# Patient Record
Sex: Female | Born: 1935 | Race: White | Hispanic: No | Smoking: Never smoker
Health system: Southern US, Community
[De-identification: ages and names within clinical notes are randomized; demographics above are authoritative.]

## PROBLEM LIST (undated history)

## (undated) DIAGNOSIS — I129 Hypertensive chronic kidney disease with stage 1 through stage 4 chronic kidney disease, or unspecified chronic kidney disease: Secondary | ICD-10-CM

## (undated) DIAGNOSIS — I10 Essential (primary) hypertension: Secondary | ICD-10-CM

## (undated) DIAGNOSIS — M8580 Other specified disorders of bone density and structure, unspecified site: Secondary | ICD-10-CM

## (undated) DIAGNOSIS — M4800 Spinal stenosis, site unspecified: Secondary | ICD-10-CM

## (undated) DIAGNOSIS — E079 Disorder of thyroid, unspecified: Secondary | ICD-10-CM

## (undated) DIAGNOSIS — S42302A Unspecified fracture of shaft of humerus, left arm, initial encounter for closed fracture: Secondary | ICD-10-CM

## (undated) DIAGNOSIS — E785 Hyperlipidemia, unspecified: Secondary | ICD-10-CM

## (undated) DIAGNOSIS — N183 Chronic kidney disease, stage 3 unspecified: Secondary | ICD-10-CM

## (undated) DIAGNOSIS — M109 Gout, unspecified: Secondary | ICD-10-CM

## (undated) DIAGNOSIS — M1990 Unspecified osteoarthritis, unspecified site: Secondary | ICD-10-CM

## (undated) HISTORY — DX: Chronic kidney disease, stage 3 unspecified: N18.30

## (undated) HISTORY — DX: Other specified disorders of bone density and structure, unspecified site: M85.80

## (undated) HISTORY — PX: DILATION AND CURETTAGE OF UTERUS: SHX78

## (undated) HISTORY — DX: Hypertensive chronic kidney disease with stage 1 through stage 4 chronic kidney disease, or unspecified chronic kidney disease: I12.9

## (undated) HISTORY — DX: Unspecified fracture of shaft of humerus, left arm, initial encounter for closed fracture: S42.302A

## (undated) HISTORY — DX: Hyperlipidemia, unspecified: E78.5

## (undated) HISTORY — PX: BREAST LUMPECTOMY: SHX2

## (undated) HISTORY — PX: CATARACT EXTRACTION, BILATERAL: SHX1313

## (undated) HISTORY — PX: CHOLECYSTECTOMY: SHX55

## (undated) HISTORY — DX: Chronic kidney disease, stage 3 (moderate): N18.3

## (undated) HISTORY — DX: Unspecified osteoarthritis, unspecified site: M19.90

## (undated) HISTORY — PX: MENISCUS REPAIR: SHX5179

## (undated) HISTORY — PX: THYROID SURGERY: SHX805

## (undated) HISTORY — PX: OTHER SURGICAL HISTORY: SHX169

## (undated) HISTORY — DX: Disorder of thyroid, unspecified: E07.9

## (undated) HISTORY — PX: KNEE SURGERY: SHX244

## (undated) HISTORY — PX: RADIOFREQUENCY ABLATION NERVES: SUR1070

## (undated) HISTORY — PX: ABDOMINAL HYSTERECTOMY: SHX81

## (undated) HISTORY — PX: CARPAL TUNNEL RELEASE: SHX101

## (undated) HISTORY — PX: GALLBLADDER SURGERY: SHX652

## (undated) HISTORY — PX: TONSILLECTOMY: SUR1361

## (undated) HISTORY — DX: Gout, unspecified: M10.9

---

## 1997-12-04 ENCOUNTER — Ambulatory Visit (HOSPITAL_COMMUNITY): Admission: RE | Admit: 1997-12-04 | Discharge: 1997-12-04 | Payer: Self-pay | Admitting: *Deleted

## 1997-12-06 ENCOUNTER — Ambulatory Visit (HOSPITAL_COMMUNITY): Admission: RE | Admit: 1997-12-06 | Discharge: 1997-12-06 | Payer: Self-pay | Admitting: *Deleted

## 1998-01-10 ENCOUNTER — Other Ambulatory Visit: Admission: RE | Admit: 1998-01-10 | Discharge: 1998-01-10 | Payer: Self-pay | Admitting: *Deleted

## 1998-02-26 ENCOUNTER — Other Ambulatory Visit: Admission: RE | Admit: 1998-02-26 | Discharge: 1998-02-26 | Payer: Self-pay | Admitting: *Deleted

## 1998-04-01 ENCOUNTER — Other Ambulatory Visit: Admission: RE | Admit: 1998-04-01 | Discharge: 1998-04-01 | Payer: Self-pay | Admitting: *Deleted

## 1998-11-05 ENCOUNTER — Other Ambulatory Visit: Admission: RE | Admit: 1998-11-05 | Discharge: 1998-11-05 | Payer: Self-pay | Admitting: *Deleted

## 1998-12-25 ENCOUNTER — Ambulatory Visit (HOSPITAL_COMMUNITY): Admission: RE | Admit: 1998-12-25 | Discharge: 1998-12-25 | Payer: Self-pay | Admitting: *Deleted

## 1999-06-04 ENCOUNTER — Other Ambulatory Visit: Admission: RE | Admit: 1999-06-04 | Discharge: 1999-06-04 | Payer: Self-pay | Admitting: Obstetrics & Gynecology

## 1999-06-05 ENCOUNTER — Other Ambulatory Visit: Admission: RE | Admit: 1999-06-05 | Discharge: 1999-06-05 | Payer: Self-pay | Admitting: Obstetrics & Gynecology

## 1999-06-05 ENCOUNTER — Encounter (INDEPENDENT_AMBULATORY_CARE_PROVIDER_SITE_OTHER): Payer: Self-pay | Admitting: Specialist

## 1999-07-31 ENCOUNTER — Encounter (INDEPENDENT_AMBULATORY_CARE_PROVIDER_SITE_OTHER): Payer: Self-pay | Admitting: Specialist

## 1999-07-31 ENCOUNTER — Other Ambulatory Visit: Admission: RE | Admit: 1999-07-31 | Discharge: 1999-07-31 | Payer: Self-pay | Admitting: Obstetrics & Gynecology

## 1999-10-31 ENCOUNTER — Other Ambulatory Visit: Admission: RE | Admit: 1999-10-31 | Discharge: 1999-10-31 | Payer: Self-pay | Admitting: Obstetrics & Gynecology

## 1999-12-29 ENCOUNTER — Ambulatory Visit (HOSPITAL_COMMUNITY): Admission: RE | Admit: 1999-12-29 | Discharge: 1999-12-29 | Payer: Self-pay | Admitting: *Deleted

## 2000-03-04 ENCOUNTER — Other Ambulatory Visit: Admission: RE | Admit: 2000-03-04 | Discharge: 2000-03-04 | Payer: Self-pay | Admitting: Obstetrics & Gynecology

## 2001-01-05 ENCOUNTER — Encounter: Payer: Self-pay | Admitting: Internal Medicine

## 2001-01-05 ENCOUNTER — Ambulatory Visit (HOSPITAL_COMMUNITY): Admission: RE | Admit: 2001-01-05 | Discharge: 2001-01-05 | Payer: Self-pay | Admitting: Internal Medicine

## 2001-05-18 ENCOUNTER — Other Ambulatory Visit: Admission: RE | Admit: 2001-05-18 | Discharge: 2001-05-18 | Payer: Self-pay | Admitting: Internal Medicine

## 2002-01-11 ENCOUNTER — Ambulatory Visit (HOSPITAL_COMMUNITY): Admission: RE | Admit: 2002-01-11 | Discharge: 2002-01-11 | Payer: Self-pay | Admitting: Internal Medicine

## 2002-01-11 ENCOUNTER — Encounter: Payer: Self-pay | Admitting: Internal Medicine

## 2002-06-14 ENCOUNTER — Encounter: Payer: Self-pay | Admitting: Obstetrics and Gynecology

## 2002-06-16 ENCOUNTER — Encounter (INDEPENDENT_AMBULATORY_CARE_PROVIDER_SITE_OTHER): Payer: Self-pay | Admitting: Specialist

## 2002-06-17 ENCOUNTER — Inpatient Hospital Stay (HOSPITAL_COMMUNITY): Admission: AD | Admit: 2002-06-17 | Discharge: 2002-06-18 | Payer: Self-pay | Admitting: Obstetrics and Gynecology

## 2003-01-16 ENCOUNTER — Encounter: Payer: Self-pay | Admitting: Internal Medicine

## 2003-01-16 ENCOUNTER — Ambulatory Visit (HOSPITAL_COMMUNITY): Admission: RE | Admit: 2003-01-16 | Discharge: 2003-01-16 | Payer: Self-pay | Admitting: Internal Medicine

## 2003-01-25 ENCOUNTER — Encounter: Admission: RE | Admit: 2003-01-25 | Discharge: 2003-01-25 | Payer: Self-pay | Admitting: Internal Medicine

## 2003-01-25 ENCOUNTER — Encounter: Payer: Self-pay | Admitting: Internal Medicine

## 2003-05-29 ENCOUNTER — Encounter: Payer: Self-pay | Admitting: Internal Medicine

## 2003-05-29 ENCOUNTER — Encounter: Admission: RE | Admit: 2003-05-29 | Discharge: 2003-05-29 | Payer: Self-pay | Admitting: Internal Medicine

## 2003-06-21 ENCOUNTER — Ambulatory Visit (HOSPITAL_COMMUNITY): Admission: RE | Admit: 2003-06-21 | Discharge: 2003-06-21 | Payer: Self-pay | Admitting: Podiatry

## 2003-06-21 ENCOUNTER — Encounter: Payer: Self-pay | Admitting: Podiatry

## 2004-01-07 ENCOUNTER — Ambulatory Visit (HOSPITAL_COMMUNITY): Admission: RE | Admit: 2004-01-07 | Discharge: 2004-01-07 | Payer: Self-pay | Admitting: Orthopedic Surgery

## 2004-01-15 ENCOUNTER — Encounter: Admission: RE | Admit: 2004-01-15 | Discharge: 2004-01-15 | Payer: Self-pay | Admitting: Orthopedic Surgery

## 2004-01-17 ENCOUNTER — Ambulatory Visit (HOSPITAL_COMMUNITY): Admission: RE | Admit: 2004-01-17 | Discharge: 2004-01-17 | Payer: Self-pay | Admitting: Orthopedic Surgery

## 2004-01-17 ENCOUNTER — Ambulatory Visit (HOSPITAL_BASED_OUTPATIENT_CLINIC_OR_DEPARTMENT_OTHER): Admission: RE | Admit: 2004-01-17 | Discharge: 2004-01-17 | Payer: Self-pay | Admitting: Orthopedic Surgery

## 2004-02-19 ENCOUNTER — Ambulatory Visit (HOSPITAL_COMMUNITY): Admission: RE | Admit: 2004-02-19 | Discharge: 2004-02-19 | Payer: Self-pay | Admitting: Internal Medicine

## 2004-05-20 ENCOUNTER — Ambulatory Visit (HOSPITAL_COMMUNITY): Admission: RE | Admit: 2004-05-20 | Discharge: 2004-05-20 | Payer: Self-pay | Admitting: Gastroenterology

## 2004-05-29 ENCOUNTER — Encounter: Admission: RE | Admit: 2004-05-29 | Discharge: 2004-05-29 | Payer: Self-pay | Admitting: Family Medicine

## 2004-10-21 ENCOUNTER — Encounter: Admission: RE | Admit: 2004-10-21 | Discharge: 2004-10-21 | Payer: Self-pay | Admitting: Internal Medicine

## 2005-02-25 ENCOUNTER — Ambulatory Visit (HOSPITAL_COMMUNITY): Admission: RE | Admit: 2005-02-25 | Discharge: 2005-02-25 | Payer: Self-pay | Admitting: Internal Medicine

## 2005-11-18 ENCOUNTER — Encounter: Admission: RE | Admit: 2005-11-18 | Discharge: 2005-11-18 | Payer: Self-pay | Admitting: Internal Medicine

## 2006-03-10 ENCOUNTER — Ambulatory Visit (HOSPITAL_COMMUNITY): Admission: RE | Admit: 2006-03-10 | Discharge: 2006-03-10 | Payer: Self-pay | Admitting: Internal Medicine

## 2006-05-18 ENCOUNTER — Encounter: Admission: RE | Admit: 2006-05-18 | Discharge: 2006-05-18 | Payer: Self-pay | Admitting: Internal Medicine

## 2006-06-01 ENCOUNTER — Encounter: Admission: RE | Admit: 2006-06-01 | Discharge: 2006-06-01 | Payer: Self-pay | Admitting: Internal Medicine

## 2006-12-15 ENCOUNTER — Ambulatory Visit (HOSPITAL_COMMUNITY): Admission: RE | Admit: 2006-12-15 | Discharge: 2006-12-15 | Payer: Self-pay | Admitting: Family Medicine

## 2007-03-15 ENCOUNTER — Ambulatory Visit (HOSPITAL_COMMUNITY): Admission: RE | Admit: 2007-03-15 | Discharge: 2007-03-15 | Payer: Self-pay | Admitting: Family Medicine

## 2007-07-11 ENCOUNTER — Encounter: Admission: RE | Admit: 2007-07-11 | Discharge: 2007-07-11 | Payer: Self-pay | Admitting: Nephrology

## 2007-10-25 ENCOUNTER — Encounter: Admission: RE | Admit: 2007-10-25 | Discharge: 2007-10-25 | Payer: Self-pay | Admitting: Family Medicine

## 2008-03-19 ENCOUNTER — Ambulatory Visit (HOSPITAL_COMMUNITY): Admission: RE | Admit: 2008-03-19 | Discharge: 2008-03-19 | Payer: Self-pay | Admitting: Family Medicine

## 2009-02-22 ENCOUNTER — Ambulatory Visit: Payer: Self-pay | Admitting: Diagnostic Radiology

## 2009-02-22 ENCOUNTER — Emergency Department (HOSPITAL_BASED_OUTPATIENT_CLINIC_OR_DEPARTMENT_OTHER): Admission: EM | Admit: 2009-02-22 | Discharge: 2009-02-22 | Payer: Self-pay | Admitting: Emergency Medicine

## 2009-03-25 ENCOUNTER — Ambulatory Visit (HOSPITAL_COMMUNITY): Admission: RE | Admit: 2009-03-25 | Discharge: 2009-03-25 | Payer: Self-pay | Admitting: Family Medicine

## 2009-03-28 ENCOUNTER — Encounter
Admission: RE | Admit: 2009-03-28 | Discharge: 2009-04-15 | Payer: Self-pay | Admitting: Physical Medicine & Rehabilitation

## 2009-04-02 ENCOUNTER — Ambulatory Visit: Payer: Self-pay | Admitting: Physical Medicine & Rehabilitation

## 2009-04-15 ENCOUNTER — Ambulatory Visit: Payer: Self-pay | Admitting: Physical Medicine & Rehabilitation

## 2009-07-08 ENCOUNTER — Encounter
Admission: RE | Admit: 2009-07-08 | Discharge: 2009-07-09 | Payer: Self-pay | Admitting: Physical Medicine & Rehabilitation

## 2009-07-09 ENCOUNTER — Ambulatory Visit: Payer: Self-pay | Admitting: Physical Medicine & Rehabilitation

## 2009-09-27 ENCOUNTER — Emergency Department (HOSPITAL_COMMUNITY): Admission: EM | Admit: 2009-09-27 | Discharge: 2009-09-27 | Payer: Self-pay | Admitting: Emergency Medicine

## 2009-12-05 ENCOUNTER — Encounter
Admission: RE | Admit: 2009-12-05 | Discharge: 2009-12-09 | Payer: Self-pay | Admitting: Physical Medicine & Rehabilitation

## 2009-12-09 ENCOUNTER — Ambulatory Visit: Payer: Self-pay | Admitting: Physical Medicine & Rehabilitation

## 2010-04-16 ENCOUNTER — Ambulatory Visit (HOSPITAL_COMMUNITY): Admission: RE | Admit: 2010-04-16 | Discharge: 2010-04-16 | Payer: Self-pay | Admitting: Family Medicine

## 2010-05-26 ENCOUNTER — Encounter
Admission: RE | Admit: 2010-05-26 | Discharge: 2010-08-24 | Payer: Self-pay | Admitting: Physical Medicine & Rehabilitation

## 2010-06-02 ENCOUNTER — Ambulatory Visit: Payer: Self-pay | Admitting: Physical Medicine & Rehabilitation

## 2010-11-18 ENCOUNTER — Ambulatory Visit: Payer: BC Managed Care – HMO | Admitting: Physical Medicine & Rehabilitation

## 2010-11-18 ENCOUNTER — Encounter: Payer: Medicare Other | Attending: Physical Medicine & Rehabilitation

## 2010-11-18 DIAGNOSIS — M47817 Spondylosis without myelopathy or radiculopathy, lumbosacral region: Secondary | ICD-10-CM | POA: Insufficient documentation

## 2010-11-18 DIAGNOSIS — G8929 Other chronic pain: Secondary | ICD-10-CM | POA: Insufficient documentation

## 2011-01-01 ENCOUNTER — Encounter: Payer: Medicare Other | Attending: Physical Medicine & Rehabilitation

## 2011-01-01 ENCOUNTER — Encounter (HOSPITAL_BASED_OUTPATIENT_CLINIC_OR_DEPARTMENT_OTHER): Payer: BC Managed Care – HMO | Admitting: Physical Medicine & Rehabilitation

## 2011-01-01 DIAGNOSIS — M47817 Spondylosis without myelopathy or radiculopathy, lumbosacral region: Secondary | ICD-10-CM

## 2011-01-01 DIAGNOSIS — G8929 Other chronic pain: Secondary | ICD-10-CM | POA: Insufficient documentation

## 2011-01-05 LAB — URINE MICROSCOPIC-ADD ON

## 2011-01-05 LAB — CULTURE, BLOOD (ROUTINE X 2): Culture: NO GROWTH

## 2011-01-05 LAB — COMPREHENSIVE METABOLIC PANEL
AST: 22 U/L (ref 0–37)
Albumin: 3.1 g/dL — ABNORMAL LOW (ref 3.5–5.2)
BUN: 15 mg/dL (ref 6–23)
CO2: 28 mEq/L (ref 19–32)
Calcium: 8.3 mg/dL — ABNORMAL LOW (ref 8.4–10.5)
Creatinine, Ser: 1.16 mg/dL (ref 0.4–1.2)
Glucose, Bld: 93 mg/dL (ref 70–99)
Sodium: 138 mEq/L (ref 135–145)
Total Protein: 5.9 g/dL — ABNORMAL LOW (ref 6.0–8.3)

## 2011-01-05 LAB — DIFFERENTIAL
Basophils Absolute: 0.1 10*3/uL (ref 0.0–0.1)
Basophils Relative: 1 % (ref 0–1)
Eosinophils Relative: 2 % (ref 0–5)
Lymphocytes Relative: 14 % (ref 12–46)
Lymphs Abs: 0.9 10*3/uL (ref 0.7–4.0)
Monocytes Absolute: 0.5 10*3/uL (ref 0.1–1.0)
Monocytes Relative: 8 % (ref 3–12)

## 2011-01-05 LAB — URINE CULTURE

## 2011-01-05 LAB — CBC
HCT: 34.3 % — ABNORMAL LOW (ref 36.0–46.0)
Hemoglobin: 11 g/dL — ABNORMAL LOW (ref 12.0–15.0)
MCHC: 32 g/dL (ref 30.0–36.0)
MCV: 88.1 fL (ref 78.0–100.0)
Platelets: 208 10*3/uL (ref 150–400)
RBC: 3.9 MIL/uL (ref 3.87–5.11)

## 2011-01-05 LAB — BRAIN NATRIURETIC PEPTIDE: Pro B Natriuretic peptide (BNP): 53.4 pg/mL (ref 0.0–100.0)

## 2011-01-05 LAB — URINALYSIS, ROUTINE W REFLEX MICROSCOPIC: Bilirubin Urine: NEGATIVE

## 2011-01-06 NOTE — Procedures (Signed)
NAMEMILIANI, DEIKE                  ACCOUNT NO.:  [id number]  MEDICAL RECORD NO.:  [id number]           PATIENT TYPE:  LOCATION:                                 FACILITY:  PHYSICIAN:  Erick Colace, M.D.DATE OF BIRTH:  [birth year]  DATE OF PROCEDURE: DATE OF DISCHARGE:                              OPERATIVE REPORT  PROCEDURE:  Bilateral L5 dorsal ramus injection, bilateral L4 medial branch block, bilateral L3 medial branch block under fluoroscopic guidance.  INDICATIONS:  Lumbar pain, spondylosis, affecting L4-5, L5-S1 level. She has scoliosis.  Pain is only partially response to medication management other conservative care.  Oswestry score 44%.  Pain is reported as moderately severe.  Informed consent was obtained after describing the risks and benefits of the procedure with the patient.  These include bleeding, bruising, and infection.  She elects to proceed and has given written consent.  The patient was placed prone on fluoroscopy table.  Betadine prep, sterile drape, 25-gauge inch and half needle was used to anesthetize the skin and subcutaneous tissue, 1% lidocaine x2 mL, and 22-gauge 3-1/2 spinal needle was inserted under fluoroscopic guidance first charting left S1 SAP sacral junction, bone contact made.  Omnipaque 180 x0.5 mL demonstrated no intravascular uptake, then 0.5 mL of the solution containing 1 mL of 4 mg/mL, dexamethasone 2 mL of 2% MPF lidocaine.  The left L5 SAP transverse process junction targeted bone contact made, confirmed with lateral imaging.  Omnipaque 180 x0.5 mL demonstrated no intravascular uptake and then 0.5 mL of the dexamethasone-lidocaine solution was injected in the left L4 SAP transverse process junction targeted bone contact made.  Omnipaque 180 x0.5 mL demonstrated no intravascular uptake, then 0.5 mL of the dexamethasone-lidocaine solution was injected.  The same procedure was repeated on the right side using same needle injectate and  technique.  The patient tolerated the procedure well.  Pre and post injection vitals stable.  Post injection instructions given.     Erick Colace, M.D. Electronically Signed    AEK/MEDQ  D:  01/01/2011 10:13:02  T:  01/01/2011 21:22:44  Job:  [id number]

## 2011-02-05 ENCOUNTER — Ambulatory Visit: Payer: BC Managed Care – HMO | Admitting: Physical Medicine & Rehabilitation

## 2011-02-12 ENCOUNTER — Encounter: Payer: Medicare Other | Attending: Physical Medicine & Rehabilitation

## 2011-02-12 ENCOUNTER — Ambulatory Visit (HOSPITAL_BASED_OUTPATIENT_CLINIC_OR_DEPARTMENT_OTHER): Payer: Medicare Other | Admitting: Physical Medicine & Rehabilitation

## 2011-02-12 DIAGNOSIS — M47817 Spondylosis without myelopathy or radiculopathy, lumbosacral region: Secondary | ICD-10-CM | POA: Insufficient documentation

## 2011-02-12 DIAGNOSIS — G8929 Other chronic pain: Secondary | ICD-10-CM | POA: Insufficient documentation

## 2011-02-13 NOTE — Procedures (Signed)
Kari Martinez, Kari Martinez                  ACCOUNT NO.:  [id number]  MEDICAL RECORD NO.:  [id number]           PATIENT TYPE:  LOCATION:                                 FACILITY:  PHYSICIAN:  Erick Colace, M.D.DATE OF BIRTH:  1924/02/27  DATE OF PROCEDURE:  02/12/2011 DATE OF DISCHARGE:                              OPERATIVE REPORT  PROCEDURE:  Bilateral L5 dorsal ramus injection, bilateral L4 and bilateral L3 medial branch blocks under fluoroscopic guidance.  INDICATION:  Lumbar spondylosis, lumbar pain nonradiating, she had 80% relief at least temporarily from injection, performed on January 01, 2011, at corresponding levels.  She is here for repeat injection with Marcaine rather than lidocaine.  Informed consent was obtained after describing risks and benefits of the procedure with the patient.  These include bleeding, bruising, and infection.  She elects to proceed and has given written consent.  The patient placed prone on fluoroscopy table.  Betadine prep, sterile drape, a 25-gauge inch and half needle was used to anesthetize the skin and subcutaneous tissue 1% lidocaine x 1.5 mL at each of 6 sites and a 22-gauge 3-1/2-inch spinal needle was inserted first charting left S1 SAP sacral ala junction, bone contact made.  Omnipaque 180 x 0.5 mL demonstrated no intravascular uptake, followed by injection of 0.5 mL of solution containing 1 mL with 4mg /mL dexamethasone and 4 mL of 0.25% Marcaine.  Then the left L5 SAP transverse process junction, targeted bone contact made.  Omnipaque 180 x 0.5 mL demonstrated no intravascular uptake.  Then 0.5 mL of dexamethasone lidocaine solution was injected. Then the left L4 SAP transverse process junction, targeted bone contact made.  Omnipaque 180 x 0.5 mL demonstrated no intravascular uptake. Then 0.5 mL of dexamethasone lidocaine solution was injected.  The same procedure was repeated on the right side using same needle injectate and technique.   Pre and post injection vitals stable.  Post injection instructions given.  I will see her in 3 weeks to see how she did with this injection.  Pain diary given.  We will assess whether or not she is a candidate for radiofrequency neurotomy at that time.  Discussed with patient and her granddaughter.     Erick Colace, M.D. Electronically Signed    AEK/MEDQ  D:  02/12/2011 10:01:50  T:  02/12/2011 22:52:18  Job:  [id number]

## 2011-02-17 NOTE — Group Therapy Note (Signed)
The patient referred by Dario Guardian, MD   An 77 year old female, who had onset of back pain around 2006.  At that  time, she underwent about 6 weeks of physical therapy, but this  unfortunately did not help her per her report.  She had right hip x-rays  at that time.  Apparently, her right side was bothering her more than  her left side.  She has been taking tramadol 50 mg t.i.d. or q.i.d. over  the last year or more which has been doing okay for her up until Feb 21, 2009, when she woke up at about 1:00 a.m. with severe pain in her back.  She states that prior to that time, she was loading and unloading a  walker earlier that day in and out of a car for a friend's appointment.  She has had no bowel or bladder dysfunction.  No sweats or chills.  No  fevers.  She has had no weight loss.  She does have a history of bladder  sling procedure, chronic stress type of incontinence.  See hip films as  far back as 2004, showing degenerative joint disease left hip with  narrowed hip joint at that time, slight spurring at the left acetabulum.  The patient has complained of some groin pain as well as back and  buttock pain.  These seem to be separate issues, they do not come on at  the same time per her report.  She has had a left knee MRI showing a  posterior horn in the medial meniscus tear in 2005.  She has had 2-view  of the pelvis in 2006 showing left greater than right DJD, facet joint  changes noted.  The hip away was graded as mild in 2006.  She has had  MRIs of the lumbar spine in 2007 and again in 2009.  The most recent one  demonstrated multilevel facet joint degenerative changes, most marked at  L4-5 and L5-S1, the present even to a a lesser extent at L2-3, L3-4.  She has moderate spinal stenosis starting at the L2-3 level.  Had a  protruding disk at left posterior lateral that actually looked better  compared to 2007.  Mild anterior slip L3 on L4 based on some facet  degenerative  changes.  She had a protrusion more for the right at L4-5,  marked right-sided foraminal narrowing, left-sided mild foraminal  narrowing, moderate to marked central stenosis at that level at L5-S1,  broad-based disk protrusion more towards the left, but only mild central  stenosis, mild left-sided foraminal narrowing mild to moderate on the  right.   I have reviewed these imaging studies myself on E-chart.   PAST SURGICAL HISTORY:  Tonsillectomy 1934, D and C 1963, carpal tunnel  surgery 1974 on the right, thyroid surgery left lobe 1980, lumpectomy  left breast.  She has had cholecystectomy, hysterectomy.  She had a torn  meniscus 2005.  Bladder sling in the past.   SOCIAL HISTORY:  Widowed, lives alone.  Daughter works at [employer].   Examination, elderly female, in no acute distress.  Height 5 feet 3  inches, weight 215 pounds with BMI calculated at 38.   Obese female in no acute distress.  Extremities have 2+ edema bilateral  pretibial and feet.  Orientation x3.  Mood, memory, affect are intact.  Gait is without evidence of toe drag or knee instability.  She has  slightly widened base support.  Short step length.   Her  coordination shows no ataxia, truncal, or limb.  Deep tendon  reflexes are reduced bilateral knees and ankles at 1+.  Her sensation  reduced bilateral S1, right L4, right L3.  Her hip range of motion is  mildly reduced bilaterally in internal and external rotation, but not  accompanied by pain.  She has no pain over the trochanteric bursa or the  greater trochanter.  Straight leg raising test is negative.  Motor  strength is normal, bilateral hip flexion, knee extension, ankle  dorsiflexors as well as the upper extremities.  Range of motion is  normal in the upper extremities and only mildly diminished in the neck.   Straight leg raising test is negative bilaterally.  Her back has some  tenderness to palpation in the lower lumbar area around L5-S1.  Pain is   accentuated by left-sided bending as well as extension in the spine.  She has very good flexion of the spine and this does not cause any pain.  Also, reviewed imaging studies, she has mild scoliosis, degenerative in  nature, levo cons back in the lumbar area.   IMPRESSION:  Back pain, hip pain as well as buttock pain.  I believe  this is multifactorial based on exam, although it is difficult to tell  based on overlap of these structures.   She certainly has facet arthropathy which is marked which may explain  some of her left-sided buttock symptoms.  She also has left hip DJD  which is worse than on the right side and likely has progressed since  her last x-ray in 2007.   Lumbar spinal stenosis.  I am not convinced that she has significant  radicular symptoms in terms of pain.  She does have some sensory loss  which may correlate on the right side.  This is an L4 and L3 dermatomes.   PLAN:  We will do diagnostic medial branch blocks on the left side to  see if this resolves at least her back and buttock pain.  Based on these  results, we will determine further treatment and diagnostics which may  include a hip x-rays plus and minus repeat MRI.   Thank you for this interesting referral.  We will start her on tramadol  2 p.o. t.i.d.  We discussed the risks and benefits of using stronger  opioids in someone her age and but if we need to we can go back to the  hydrocodone perhaps at a slightly higher dose if not helpful at the 5 mg  dose.      Erick Colace, M.D.  Electronically Signed     AEK/MedQ  D:  04/02/2009 10:25:13  T:  04/03/2009 01:17:40  Job #:  [id number]   cc:   Dario Guardian, M.D.  Fax: [phone]

## 2011-02-17 NOTE — Assessment & Plan Note (Signed)
Ms. Momon is an 77 year old female with a history of L2-3 protrusion,  low back pain, moderate facet joint arthropathy, originally set up for  medial branch blocks.  She was started on tramadol for pain 50 mg 2 p.o.  t.i.d. and this has resulted in significant reduction of her pain from  5/10 with activity to basically 1/10 with activity.  Her sleep is now  good.  She can walk 30 minutes at a time, climb steps.  She drives.  She  has some bladder control problems only when she sneezes or coughs.   Her mood and affect are appropriate.  Her Oswestry score 36%.   Examination, elderly female in no acute distress.  Orientation x3.  Mood  and affect are appropriate.  She is quite pleased.  She has good  strength in lower extremities.  Her ambulation is without evidence of  toe drag or knee instability.   IMPRESSION:  Lumbar spondylosis without myelopathy.  Overall, pain  control improved just on some tramadol.  We will monitor on this.  She  will back in 4 months.  If she has significant pain recurrence prior to  that time, she is to give me a call and we will set her up for facet  medial branch blocks.  We discussed this strategy and she agrees with  the plan.      Erick Colace, M.D.  Electronically Signed     AEK/MedQ  D:  04/15/2009 16:41:40  T:  04/16/2009 04:33:44  Job #:  [id number]

## 2011-02-20 NOTE — H&P (Signed)
NAME:  Kari Martinez, Kari Martinez                            ACCOUNT NO.:  [id number]   MEDICAL RECORD NO.:  [id number]                   PATIENT TYPE:  AMB   LOCATION:  DAY                                  FACILITY:  [facility]   PHYSICIAN:  Malachi Pro. Ambrose Mantle, M.D.              DATE OF BIRTH:  09/14/1924   DATE OF ADMISSION:  DATE OF DISCHARGE:                                HISTORY & PHYSICAL   HISTORY OF PRESENT ILLNESS:  This is a 77 year old white widowed female,  para 3-1-0-4, who was admitted to the hospital for vaginal hysterectomy, A&P  repair, and also urethral suspension procedure because of uterine prolapse,  cystocele, rectocele, and stress urinary incontinence.  This patient's last  period was 35 years ago.  She apparently did well until about 1-1/2 to two  years ago when she noted protrusion from the vagina when wiping.  She went  to see a medical physician, who referred her to a gynecologist.  The  gynecologist inserted a pessary but the patient could not tolerate the  pessary.  She has continued to have protrusion from the vagina and is  bothered by pressure.  She also has stress urinary incontinence, but she  does not have any bowel problems.  She prefers to proceed with surgery  rather than try another pessary.  She has been evaluated by Dr. Ihor Gully  and he will do a urethral suspension procedure.   PAST MEDICAL HISTORY:   ALLERGIES:  SEPTRA and TORADOL.   ILLNESSES:  1. She has had usual childhood diseases.  2. She had pneumonia in 1957.  3. She has also had high blood pressure.  4. Thyroid dysfunction.  5. High cholesterol.   OPERATIONS:  1. D&C in 1963.  2. Tonsillectomy in 1934.  3. Carpal tunnel of her right wrist surgery in 1974.  4. Thyroid surgery in 1980.  5. Left breast in 1986.  6. Cholecystectomy in 1993.   MEDICATIONS:  1. Multivitamins.  2. Vitamin C.  3. Allegra 180 mg.  4. Demadex 20 mg.  5. Diovan 80 mg.  6. Levothroid 0.15 mg.  7. Nasonex.  8. Tricor 54 mg.  9. Evista 60 mg.  10.      Calcium 600 mg.   FAMILY HISTORY:  Grandmother with diabetes.  Father had heart attack at 28  and died at age 22.   OBSTETRIC HISTORY:  She had four vaginal deliveries ranging in gestational  age from 50 weeks to term.   REVIEW OF SYSTEMS:  Except as in the present illness, is noncontributory.  The patient did have a chest x-ray for preoperative evaluation.  It showed  mild cardiac enlargement.  No acute pulmonary findings.  She had calcified  mediastinal and hilar lymph nodes along with what was probably a calcified  granuloma at the left lung base.  This film was compared to a report of a  previous chest x-ray dated December 26, 1998, which described the calcified  hilar and mediastinal nodes and also described the small calcified granuloma  at left lung base.  No follow-up was felt to be necessary.   PHYSICAL EXAMINATION:  GENERAL:  Reveals a well-developed, pleasant white  female.  VITAL SIGNS:  She is 5 feet 4 inches, 222-1/2 pounds, pulse of 80, blood  pressure 164/88.  HEENT:  Reveal no cranial abnormalities.  Extraocular movements are intact.  Nose and pharynx clear.  NECK:  Supple without thyromegaly.  HEART:  Normal size and sounds, no murmurs.  LUNGS:  Clear to P&A.  BREASTS:  Soft without masses.  ABDOMEN:  Soft and obese without mass.  The liver, spleen, and kidneys are  not felt.  There are no masses palpable.  PELVIC:  Pap smear on May 31, 2002, was satisfactory and within normal  limits.  The vulva and vagina are clean.  There is a fourth degree  cystocele, a third degree rectocele, and second degree uterine prolapse.  The uterus is hard to feel but is thought to be mid plane and normal size.  The adnexa are free of masses and rectal exam confirms the above findings.   ADMITTING IMPRESSION:  1. Pelvic relaxation with uterine prolapse, cystocele, and rectocele.  2. Stress urinary incontinence.   The patient is  admitted for vaginal hysterectomy, A&P repair, and Dr.  Vernie Ammons will do a urethral suspension procedure.  The patient has been  counseled about the risks of surgery, including but not limited to, heart  attack, stroke, pulmonary embolus, wound disruption, hemorrhage with need  for reoperation and/or transfusion, fistula formation, nerve injury, and  intestinal obstruction.  She understands and agrees to proceed with surgery.                                                  Malachi Pro. Ambrose Mantle, M.D.    TFH/MEDQ  D:  06/15/2002  T:  06/15/2002  Job:  [id number]   cc:   Loraine Leriche C. Vernie Ammons, M.D.

## 2011-02-20 NOTE — Op Note (Signed)
NAME:  Kari Martinez, Kari Martinez                            ACCOUNT NO.:  [id number]   MEDICAL RECORD NO.:  [id number]                   PATIENT TYPE:  AMB   LOCATION:  ENDO                                 FACILITY:  [facility]   PHYSICIAN:  John C. Madilyn Fireman, M.D.                 DATE OF BIRTH:  [birth year]   DATE OF PROCEDURE:  05/20/2004  DATE OF DISCHARGE:                                 OPERATIVE REPORT   PROCEDURE:  Colonoscopy.   INDICATION FOR PROCEDURE:  Average-risk colon cancer screening in a patient  who has had no previous colon screening.   PROCEDURE:  The patient was placed in the left lateral decubitus position  and placed on the pulse monitor with continuous low-flow oxygen delivered by  nasal cannula.  She was sedated with 87.5 mcg of IV fentanyl and 8 mg IV  Versed.  The Olympus video colonoscope was inserted into the rectum and  advanced to the cecum, confirmed by transillumination of McBurney's point  and visualization of the ileocecal valve and appendiceal orifice.  The prep  was good.  The cecum, ascending, transverse, and descending colon all  appeared normal with no masses, polyps, diverticula, or other mucosal  abnormalities.  Within the sigmoid colon there was seen a few scattered  diverticula and no other abnormalities.  The rectum appeared normal and  retroflexed view of the anus revealed no obvious internal hemorrhoids.  The  scope was then withdrawn and the patient returned to the recovery room in  stable condition.  She tolerated the procedure well, and there were no  immediate complications.   IMPRESSION:  Diverticulosis, otherwise normal study.   PLAN:  Next colon screening by sigmoidoscopy in five years.                                               John C. Madilyn Fireman, M.D.    JCH/MEDQ  D:  05/20/2004  T:  05/20/2004  Job:  [id number]   cc:   Marcene Duos, M.D.  [address]  [place]  [place] [address]  Fax: [phone]

## 2011-02-20 NOTE — Op Note (Signed)
Kari Martinez, Kari Martinez                           ACCOUNT NO.:  [id number]   MEDICAL RECORD NO.:  [id number]                   PATIENT TYPE:  AMB   LOCATION:  DAY                                  FACILITY:  [facility]   PHYSICIAN:  Malachi Pro. Ambrose Mantle, M.D.              DATE OF BIRTH:  05-23-1924   DATE OF PROCEDURE:  06/16/2002  DATE OF DISCHARGE:                                 OPERATIVE REPORT   PREOPERATIVE DIAGNOSES:  Cystocele, rectocele, uterine prolapse, stress  urinary incontinence.   POSTOPERATIVE DIAGNOSES:  Cystocele, rectocele, uterine prolapse, stress  urinary incontinence.   OPERATION:  1. Vaginal hysterectomy.  2. A&P repair.   SURGEON:  Malachi Pro. Ambrose Mantle, M.D. and Veverly Fells. Vernie Ammons, M.D.   ASSISTANT:  Huel Cote, M.D.   ANESTHESIA:  General.   DESCRIPTION OF PROCEDURE:  The patient was brought to the operating room and  placed under satisfactory general anesthesia. The nurse anesthetist and the  doctor tried to intubate the patient and were unsuccessful. They were then  able to intubate the patient blindly using a different approach. The patient  was then placed in lithotomy position, the vulva, vagina, perineum and  urethra were prepped with Betadine solution and draped as a sterile field. A  Foley catheter was inserted to straight drain. Examination revealed the  uterus to be slightly posterior, normal size, the adnexa were free of  masses. There was a large cystocele and rectocele and some uterine prolapse.  The area was draped as a sterile field, the cervix was drawn into the  operative field and the cervicovaginal junction was injected with a dilute  solution of Neo-Synephrine. A circumferential incision was made around the  cervix and the bladder was pushed anteriorly, the posterior cul-de-sac was  identified and entered. The uterosacral ligaments were clamped, cut and  suture ligated and held. The cardinal ligaments were clamped, cut and suture  ligated. I  was then able to enter the anterior peritoneum. I took a couple  of extra bites along the sides of the uterus, cut them and suture ligated  them and then I inverted the uterus through the incision in the cul-de-sac  and doubly suture ligated both upper pedicles. I could not feel  either  ovary but I did not feel any enlargement, both tubes appeared normal. I then  placed a peritoneal pursestring suture through the anterior peritoneum, the  upper pedicles, uterosacral ligaments and posterior peritoneum. Before tying  this down, I placed a suture through the uterosacral ligaments, tied it down  and then closed the peritoneum after ensuring that hemostasis was adequate.  I then undermined the anterior vaginal mucosa close to the urethral meatus,  developed a cystocele, embrocated the cystocele and then cut off redundant  vaginal mucosa. I did not close the vaginal mucosa. I undermined the  posterior vaginal mucosa from the fourchette all the way to the vaginal  cuff, placed several sutures through the rectocele. After I developed it,  cut away redundant vaginal mucosa and reunited the posterior vaginal mucosa  in the midline with interrupted figure-of-eight sutures of #0 Vicryl down to  the perineum where 3-0 Vicryl sutures were used. I did a rectal exam prior  to closing the mucosa and found no injury to the rectum. I then  reapproximated the part of the vaginal mucosa closest to the back of the  cuff with interrupted figure-of-eight sutures of #0 Vicryl. Dr. Vernie Ammons then  came in and did his [facility] sling procedure. After he was through, I extended  the anterior vaginal incision all the way to the urethra because she seemed  to have a bulge of vaginal mucosa close to the urethral meatus. I cut away  redundant vaginal mucosa and then reunited the vagina in the midline using  interrupted figure-of-eight sutures of #0 Vicryl. Hemostasis appeared  completely adequate. Vaginal pack 2 inch iodoform  was placed at the end of  the procedure. The patient seemed to tolerate the procedure well. At least  she is in the operating room at this point and has not been returned to  recovery but was in satisfactory condition when I left the operating room.  Sponge and needle counts were correct. Blood loss was 300 cc.                                               Malachi Pro. Ambrose Mantle, M.D.    TFH/MEDQ  D:  06/16/2002  T:  06/16/2002  Job:  [id number]   cc:   Loraine Leriche C. Vernie Ammons, M.D.

## 2011-02-20 NOTE — Discharge Summary (Signed)
NAME:  Kari Martinez, Kari Martinez                            ACCOUNT NO.:  [id number]   MEDICAL RECORD NO.:  [id number]                   PATIENT TYPE:  INP   LOCATION:  0460                                 FACILITY:  [facility]   PHYSICIAN:  Malachi Pro. Ambrose Mantle, M.D.              DATE OF BIRTH:  1923/11/14   DATE OF ADMISSION:  06/16/2002  DATE OF DISCHARGE:                                 DISCHARGE SUMMARY   HISTORY OF PRESENT ILLNESS:  This is a 77 year old white female admitted to  [facility] for vaginal hysterectomy with A&T repair and Spark sling  procedure because of uterine prolapse, cystocele, rectocele, and stress  urinary incontinence. The patient underwent a vaginal hysterectomy and A&T  repair by Dr. Ambrose Mantle with Dr. Senaida Ores assisting under general anesthesia  and Dr. Vernie Ammons did a Spark sling procedure. The general anesthetic was  administered and the patient could not be intubated by either the nurse  anesthetist or the anesthesiologist. They then were able to intubate her  blindly. She did well with the procedure. Dr. Vernie Ammons noted that his first  sling actually entered the bladder on both sides. He removed it and placed  another sling in perfect position. The catheter was left for approximately  for two days and at this point, the catheter has been out about one and one  half hour and she has not had the urge to void. If she is able to void, the  patient will be discharged without a catheter. If she is unable to void  freely, she will go home with the catheter and take Macrobid twice a day.  She has not passed gas or had a bowel movement but her abdomen is soft and  nontender. She is tolerating oral foods and liquids. She is ambulating well  without difficulty. She has had no urine leakage and is considered to be  ready for discharge. Her pack was removed on the first postoperative day and  was minimally stained.   LABORATORY DATA:  Initial white count was 4,400. Hemoglobin  and hematocrit  12.4 and 36.5. Platelet count 242,000. Differential was normal.  Comprehensive metabolic profile was normal except for a BUN of 24.  Creatinine was 1.3. Urinalysis was negative.   DIAGNOSTIC STUDIES:  Chest x-ray showed mild cardiac enlargement. No acute  pulmonary findings. A calcified mediastinal and hilar lymph nodes along with  what was probably a calcified granuloma at the left lung base, stable, since  her chest x-ray in 2000 and was not considered reason for follow-up. EKG  showed occasional premature ventricular complexes, otherwise normal. Path  report pending at this time.   FINAL DIAGNOSES:  Uterine prolapse, cystocele, rectocele, stress urinary  incontinence.   OPERATION:  Vaginal hysterectomy with anterior and posterior repair with  Spark sling procedure.   FINAL CONDITION:  Improved.   INSTRUCTIONS:  Concentrate on liquids, full and clear, until  passing gas. If  she has not passed gas or had a bowel movement and becomes uncomfortable,  she can use a Glycerine or Dulcolax suppository. No heavy lifting or  strenuous activity. Report temperature elevation of greater than 100.4.  Return to the office in ten days for follow-up examination. If the patient  goes home with catheter, she is to take Macrobid one cap twice a day. She is  also given a prescription for [employer] #16 tablets, one every four to  six hours as needed for pain.                                               Malachi Pro. Ambrose Mantle, M.D.    TFH/MEDQ  D:  06/18/2002  T:  06/18/2002  Job:  [id number]

## 2011-02-20 NOTE — Op Note (Signed)
NAME:  Kari Martinez, Kari Martinez                            ACCOUNT NO.:  [id number]   MEDICAL RECORD NO.:  [id number]                   PATIENT TYPE:  AMB   LOCATION:  DSC                                  FACILITY:  [facility]   PHYSICIAN:  Thera Flake., M.D.             DATE OF BIRTH:  21-Aug-1924   DATE OF PROCEDURE:  01/17/2004  DATE OF DISCHARGE:                                 OPERATIVE REPORT   PREOPERATIVE DIAGNOSES:  1. Torn medial meniscus and lateral meniscus.  2. Osteoarthritis (tricompartmental).   POSTOPERATIVE DIAGNOSIS:  1. Torn medial meniscus and lateral meniscus.  2. Osteoarthritis (tricompartmental).   OPERATION PERFORMED:  1. Arthroscopic medial and lateral meniscectomies.  2. Tricompartment debridement chondroplasty.   SURGEON:  Dyke Brackett, M.D.   ANESTHESIA:  General.   INDICATIONS FOR PROCEDURE:  The patient is a 76 year old with catching and  pain in the knee not responding to conservative treatment with MRI  suggesting medial and lateral meniscal tears.   DESCRIPTION OF PROCEDURE:  She was arthroscoped through an inferomedial,  inferolateral and accessory medial portal, total of three portals.  The  patellofemoral joint showed mild to moderate changes on the patella that  were rather generalized grade 3 debrided, trochlear groove relatively  spared, lateral compartment showed a small anterior horn tear of the lateral  meniscus requiring 10 to 15% resection of the meniscus.  There was more  extensive change along the medial compartment, specifically a complex tear  of the posterior horn of the medial meniscus requiring resection of about  40% of the meniscus substance.  Degenerative changes most noticeable on the  condyle, early grade 3 changes which were debrided.  Less severe changes on  the tibia.  Debridement chondroplasty was carried out of the medial  compartment as well.  Knee drained free of fluid.  Portals closed with  nylon.  Knee infiltrated with  Marcaine and morphine in addition to 40 mg of  Depo-Medrol with a total of 30 mL of Marcaine, 15 mL of which into the skin.  Taken to recovery room in stable condition after a lightly compressive  sterile dressing applied.                                               Thera Flake., M.D.    WDC/MEDQ  D:  01/17/2004  T:  01/17/2004  Job:  [id number]

## 2011-02-20 NOTE — Op Note (Signed)
Kari Martinez, Kari Martinez                           ACCOUNT NO.:  [id number]   MEDICAL RECORD NO.:  [id number]                   PATIENT TYPE:  AMB   LOCATION:  DAY                                  FACILITY:  [facility]   PHYSICIAN:  Mark C. Vernie Ammons, M.D.               DATE OF BIRTH:  [birth year]   DATE OF PROCEDURE:  06/16/2002  DATE OF DISCHARGE:                                 OPERATIVE REPORT   PREOPERATIVE DIAGNOSES:  Stress urinary incontinence.   POSTOPERATIVE DIAGNOSES:  Stress urinary incontinence.   PROCEDURE:  SPARC pubovaginal sling.   SURGEON:  Mark C. Vernie Ammons, M.D.   ANESTHESIA:  General.   DRAINS:  A 16 French Foley catheter.   ESTIMATED BLOOD LOSS:  Approximately 10 cc.   SPECIMENS:  None.   COMPLICATIONS:  None.   INDICATIONS FOR PROCEDURE:  The patient is a 77 year old white female who is  undergoing anterior posterior repair and hysterectomy. She was seen in my  office for evaluation of stress urinary incontinence which had been present  for approximately two years and worsened during that time with leaking  during coughing, sneezing and other maneuvers that increased intraabdominal  pressure. She had mild frequency but no nocturia, urgency, or urge  incontinence. She was found to have loss of urethrovesical junction support  with Q-tip deflection and is felt to have primary stress incontinence. I  discussed the planned procedure as well as its risks and complications which  the patient understands and wishes to proceed.   DESCRIPTION OF PROCEDURE:  After informed consent, the patient was brought  to the major OR, placed on the table, administered general anesthesia and  then removed to the dorsal lithotomy position. Her lower abdomen, genitalia  and vagina were then sterilely prepped and draped and a 16 French Foley  catheter was placed in the bladder after which Dr. Ambrose Mantle proceeded to  perform hysterectomy and anterior repair. This was dictated by him  separately. Through the anterior vaginal wall incision, I noted the urethra  palpable with the Foley catheter in place. I therefore drained the bladder  and made a stab incision three finger breadths apart just above the  symphysis pubis. With the bladder completely drained, the trocars were then  passed behind the symphysis pubis and with the bladder neck moved laterally  with the cystoscope in the bladder. The trocars were then passed out through  the mid urethral level. This was performed on the left and then right sides.  I then performed cystoscopy with a 70 degree lens and found the bladder had  a little bit of blood in it but I could not identify either of the trocars  even with manipulation of them. I therefore affixed the sling material to  the end of each trocar and passed it up through the abdominal incisions.  With the tape now in place but the plastic coating still  intact, I repeated  cystoscopy and noted that the sling material traversed the bladder on both  sides for a short distance. I therefore drained the bladder, reinserted the  Foley catheter and removed the sling material completely. I then made sure  the bladder was completely drained, passed the trocars again in identical  fashion and reinspected first with the trocars in place and noted no bladder  perforation. I then passed the sling material up through the abdominal  incision using the trocar on each side and with the sling material in place  reinspected the bladder and noted this time no sling material within the  bladder. I therefore put the Foley catheter back in the bladder, placed the  cystoscope sheath beneath the urethra and removed the protective plastic  sheathing on the sling material first on the right side and then adjusting  tension so that there was good position but no tension, removed the  sheathing from the contralateral side and then cut off the excess material.   Antibiotic solution was then  placed in the abdominal stab incisions and  these were closed with Dermabond. The vaginal tape was noted to be in good  location and the bladder was irrigated and no bleeding was noted. The tape  was then irrigated with antibiotic solution. Dr. Ambrose Mantle then proceeded to  close the anterior vaginal wall and the patient was taken to the recovery  room in stable satisfactory condition.   She will keep her catheter indwelling until she is discharged from the  hospital and follow-up in my office in approximately one week.                                               Mark C. Vernie Ammons, M.D.    MCO/MEDQ  D:  06/16/2002  T:  06/16/2002  Job:  [id number]   cc:   Malachi Pro. Ambrose Mantle, M.D.  [address]  [place]  [place] [address]  Fax: [phone]

## 2011-03-03 ENCOUNTER — Ambulatory Visit: Payer: Medicare Other | Admitting: Physical Medicine & Rehabilitation

## 2011-03-03 DIAGNOSIS — M47817 Spondylosis without myelopathy or radiculopathy, lumbosacral region: Secondary | ICD-10-CM

## 2011-03-30 ENCOUNTER — Encounter: Payer: Medicare Other | Attending: Physical Medicine & Rehabilitation

## 2011-03-30 ENCOUNTER — Ambulatory Visit (HOSPITAL_BASED_OUTPATIENT_CLINIC_OR_DEPARTMENT_OTHER): Payer: Medicare Other | Admitting: Physical Medicine & Rehabilitation

## 2011-03-30 DIAGNOSIS — M47817 Spondylosis without myelopathy or radiculopathy, lumbosacral region: Secondary | ICD-10-CM | POA: Insufficient documentation

## 2011-03-30 DIAGNOSIS — G8929 Other chronic pain: Secondary | ICD-10-CM | POA: Insufficient documentation

## 2011-03-30 DIAGNOSIS — M4120 Other idiopathic scoliosis, site unspecified: Secondary | ICD-10-CM

## 2011-03-31 NOTE — Assessment & Plan Note (Signed)
REASON FOR VISIT:  Low back pain.  Kari Martinez returns today, an 76 year old female, who had a medial branch block on Feb 12, 2011, bilaterally.  She has had excellent results.  She is down to 1/10 pain.  She is walking 20-25 minutes at time.  She climbs steps.  She drives.  She is independent with all her ADLs.  She does have some constipation.  She is on tramadol 2 p.o. t.i.d.  She has not tried going down on these at all.  PHYSICAL EXAMINATION:  VITAL SIGNS:  Blood pressure 150/64, pulse 98, respirations 18, and saturation 97% on room air. GENERAL:  No acute distress.  Mood and affect appropriate. MUSCULOSKELETAL:  Her back has no tenderness to palpation.  She has extremely limited range of motion with extension and essentially 0-10 degrees.  Flexion is about 50% range.  Lower extremity strength is normal.  Straight leg raising test is negative.  IMPRESSION:  Lumbar spondylosis as well as acquired scoliosis right, levoconvex.  PLAN:  We will reduce her tramadol to 1 tablet t.i.d. given the excellent results.  Discussed with the patient and daughter, difficult to predict how long results will last for.  I will see her back in 2 months if she has some increasing pain on the 1 tablet t.i.d. on tramadol, go back 2 t.i.d. and if she still has increasing pain, we will repeat medial branch blocks.     Erick Colace, M.D. Electronically Signed    AEK/MedQ D:  03/30/2011 10:31:54  T:  03/30/2011 23:59:21  Job #:  [id number]

## 2011-05-04 ENCOUNTER — Other Ambulatory Visit (HOSPITAL_COMMUNITY): Payer: Self-pay | Admitting: Family Medicine

## 2011-05-04 DIAGNOSIS — Z1231 Encounter for screening mammogram for malignant neoplasm of breast: Secondary | ICD-10-CM

## 2011-05-07 NOTE — Assessment & Plan Note (Signed)
REASON FOR VISIT:  Back pain.  HISTORY:  An 77 year old female with lumbar spondylosis, had 80% relief with L5 dorsal ramus, L4 and L3 medial branch blocks on January 01, 2011, and again on about 100% relief after repeat once Feb 12, 2011.  Her pain has climbed up slightly but is still better than baseline.  She is still taking her tramadol for pain.  She can walk 20 to 25-minute.  She can climb steps.  She can drive.  Her pain is worse with walking and standing.  Improves with rest and medications.  She has constipation.  MEDICATIONS:  Tramadol 2 p.o. t.i.d.  PHYSICAL EXAMINATION:  VITAL SIGNS:  Blood pressure 148/42, pulse 86, respirations 18, O2 sat 97% on room air. GENERAL:  In no acute stress.  Mood and affect appropriate. MUSCULOSKELETAL:  Back has no tenderness to palpation.  Lumbar spine has limited range of motion.  Forward flexion to about 50%.  Extension is to neutral.  IMPRESSION:  Lumbar spondylosis causing lumbar pain.  PLAN:  We will see her back in 1 month to see how she is coming along after the procedure.  If she has recurrence before that time she is to call, and we would schedule for lumbar radiofrequency.  I did ask for the patient to keep track of her pain.  Her Oswestry score is 34%.  We gave her information and explained the radiofrequency procedure today along with her daughter, Ms. Gretta Cool, who was present.     Erick Colace, M.D. Electronically Signed    AEK/MedQ D:  03/03/2011 11:56:57  T:  03/04/2011 01:13:24  Job #:  [id number]

## 2011-05-07 NOTE — Assessment & Plan Note (Signed)
CHIEF COMPLAINT:  Back pain.  An 77 year old female with lumbar spondylosis, 80% relief after L5 dorsal ramus, L4-L3 medial branch block, January 01, 2011, and then repeating 100% relief Feb 12, 2011.  Her pain has climbed up slowly, but still better than baseline.  She still taking tramadol for pain.  Walk for 20-25 minutes.  She can climb steps.  She can drive.  Her pain is worse with walking and standing, improves with rest and medication.  She has constipation on her review of systems.  MEDICATIONS:  Tramadol two p.o. t.i.d.  PHYSICAL EXAM:  VITAL SIGNS:  Blood pressure 148/42, pulse 86, respirations 18, O2 sat 97% on room air. GENERAL:  No acute stress.  Mood and affect appropriate. MUSCULOSKELETAL:  Back has no tenderness to palpation.  Lumbar spine has limited range of motion, forward flexion 50%, extension neutral.  IMPRESSION:  Lumbar spondylosis causing lumbar pain.  PLAN:  She will back in 1 month to see how she is coming along after the procedure.  If she has recurrence before that time, she is to call, and we will schedule for lumbar radiofrequency.  I did ask the patient keep up track of her pain.  Her Oswestry score currently is 34%.  Give her information, explained the radiofrequency procedure today along with her daughter, Ms. Yow, who is present.     Erick Colace, M.D. Electronically Signed    AEK/MedQ D:  05/07/2011 10:52:40  T:  05/07/2011 12:18:48  Job #:  [id number]

## 2011-05-08 ENCOUNTER — Ambulatory Visit (HOSPITAL_COMMUNITY)
Admission: RE | Admit: 2011-05-08 | Discharge: 2011-05-08 | Disposition: A | Discharge status: Home / Self Care | Payer: Medicare Other | Source: Ambulatory Visit | Attending: Family Medicine | Admitting: Family Medicine

## 2011-05-08 DIAGNOSIS — Z1231 Encounter for screening mammogram for malignant neoplasm of breast: Secondary | ICD-10-CM | POA: Insufficient documentation

## 2011-05-26 ENCOUNTER — Encounter: Payer: Medicare Other | Attending: Physical Medicine & Rehabilitation

## 2011-05-26 ENCOUNTER — Ambulatory Visit (HOSPITAL_BASED_OUTPATIENT_CLINIC_OR_DEPARTMENT_OTHER): Payer: Medicare Other | Admitting: Physical Medicine & Rehabilitation

## 2011-05-26 DIAGNOSIS — G8929 Other chronic pain: Secondary | ICD-10-CM | POA: Insufficient documentation

## 2011-05-26 DIAGNOSIS — M47817 Spondylosis without myelopathy or radiculopathy, lumbosacral region: Secondary | ICD-10-CM

## 2011-05-26 NOTE — Assessment & Plan Note (Signed)
REASON FOR VISIT:  Back pain.  HISTORY:  An 77 year old female with lumbar spondylosis.  She has responded very well to lumbar medial branch blocks.  She has had her blocks performed on Feb 12, 2011 and has had really 0-1/10 pain since that time.  She has been able to reduce her tramadol from 6 per day to 4 per day.  She has had no new medical complications.  Walking tolerance is 20-25 minutes.  She can climb steps.  She can drive.  She is independent with all her activities of daily living.  She still has some hindrance in terms of her walking due to pain, but overall doing better.  Her Oswestry index score is 34%.  Her main issues are standing tolerance of less than 1/2 hour and lifting objects other than lighter medium weight.  SOCIAL HISTORY:  Widowed, lives alone.  Nonsmoker, nondrinker.  MEDICATIONS:  Diovan, bumetanide, Vytorin, Evista, levothyroxine, prednisone, glucosamine, potassium, and Uloric.  PHYSICAL EXAMINATION:  VITAL SIGNS:  Blood pressure 146/68, pulse 103, respirations 14, and O2 sat 96% on room air. EXTREMITIES:  Straight leg raising test is negative.  Her lumbar spine range of motion is normal.  Forward flexion 0-25% extension.  Straight leg raising test negative.  Lower extremity strength is normal.  Deep tendon reflexes are normal in lower extremities.  Gait is normal.  IMPRESSION:  Lumbar spondylosis without myelopathy, improved overall after injections, able to reduce her medications.  We will continue current meds, I have written another 64-month supply with refills on her tramadol, but reduced it to 360 tablets down from 540, reflecting her reduced medication usage.  I will see her back in 6 months.  If her pain recurs before that time we can schedule earlier for medial branch blocks.     Erick Colace, M.D. Electronically Signed    AEK/MedQ D:  05/26/2011 10:41:49  T:  05/26/2011 11:12:38  Job #:  [id number]  cc:   Dr. Azzie Roup  Dario Guardian, M.D. Fax: [phone]

## 2011-11-09 ENCOUNTER — Emergency Department (HOSPITAL_COMMUNITY): Payer: Medicare Other

## 2011-11-09 ENCOUNTER — Encounter (HOSPITAL_COMMUNITY): Payer: Self-pay | Admitting: *Deleted

## 2011-11-09 ENCOUNTER — Emergency Department (HOSPITAL_COMMUNITY)
Admission: EM | Admit: 2011-11-09 | Discharge: 2011-11-09 | Disposition: A | Discharge status: Home / Self Care | Payer: Medicare Other | Attending: Emergency Medicine | Admitting: Emergency Medicine

## 2011-11-09 DIAGNOSIS — I10 Essential (primary) hypertension: Secondary | ICD-10-CM | POA: Insufficient documentation

## 2011-11-09 DIAGNOSIS — W010XXA Fall on same level from slipping, tripping and stumbling without subsequent striking against object, initial encounter: Secondary | ICD-10-CM | POA: Insufficient documentation

## 2011-11-09 DIAGNOSIS — M79609 Pain in unspecified limb: Secondary | ICD-10-CM | POA: Insufficient documentation

## 2011-11-09 DIAGNOSIS — S42293A Other displaced fracture of upper end of unspecified humerus, initial encounter for closed fracture: Secondary | ICD-10-CM | POA: Insufficient documentation

## 2011-11-09 DIAGNOSIS — M25519 Pain in unspecified shoulder: Secondary | ICD-10-CM | POA: Insufficient documentation

## 2011-11-09 DIAGNOSIS — S42213A Unspecified displaced fracture of surgical neck of unspecified humerus, initial encounter for closed fracture: Secondary | ICD-10-CM | POA: Insufficient documentation

## 2011-11-09 DIAGNOSIS — M129 Arthropathy, unspecified: Secondary | ICD-10-CM | POA: Insufficient documentation

## 2011-11-09 DIAGNOSIS — Z79899 Other long term (current) drug therapy: Secondary | ICD-10-CM | POA: Insufficient documentation

## 2011-11-09 DIAGNOSIS — S42209A Unspecified fracture of upper end of unspecified humerus, initial encounter for closed fracture: Secondary | ICD-10-CM

## 2011-11-09 HISTORY — DX: Essential (primary) hypertension: I10

## 2011-11-09 HISTORY — DX: Unspecified osteoarthritis, unspecified site: M19.90

## 2011-11-09 HISTORY — DX: Spinal stenosis, site unspecified: M48.00

## 2011-11-09 MED ORDER — OXYCODONE-ACETAMINOPHEN 5-325 MG PO TABS
2.0000 | ORAL_TABLET | Freq: Once | ORAL | Status: AC
  Administered 2011-11-09: 2 via ORAL
  Filled 2011-11-09: qty 2

## 2011-11-09 MED ORDER — FENTANYL CITRATE 0.05 MG/ML IJ SOLN: 50.0000 ug | Freq: Once | INTRAMUSCULAR | Status: DC

## 2011-11-09 MED ORDER — SODIUM CHLORIDE 0.9 % IV SOLN
Freq: Once | INTRAVENOUS | Status: AC
  Administered 2011-11-09: 10 mL/h via INTRAVENOUS

## 2011-11-09 MED ORDER — OXYCODONE-ACETAMINOPHEN 5-325 MG PO TABS: ORAL_TABLET | ORAL | Status: AC

## 2011-11-09 MED ORDER — FENTANYL CITRATE 0.05 MG/ML IJ SOLN
50.0000 ug | Freq: Once | INTRAMUSCULAR | Status: AC
  Administered 2011-11-09: 50 ug via INTRAVENOUS
  Filled 2011-11-09: qty 2

## 2011-11-09 NOTE — ED Provider Notes (Signed)
Medical screening examination/treatment/procedure(s) were conducted as a shared visit with non-physician practitioner(s) and myself.  I personally evaluated the patient during the encounter  Mechanical fall onto L shoulder with pain and decreased ROM.  No LOC.  NVI. Comminuted proximal humerus fracture. D/w Dr. Victorino Dike who has evaluated patient.  Patient and family have decided on nonoperative treatment.  Glynn Octave, MD 11/09/11 [phone]

## 2011-11-09 NOTE — ED Notes (Signed)
Pt reports tripped over carpet and fell down. Pain to left upper arm. Swelling noted through clothing. No LOC. No blood thinners.

## 2011-11-09 NOTE — ED Provider Notes (Signed)
History     CSN: [id number]  Arrival date & time 11/09/11  1219   First MD Initiated Contact with Patient 11/09/11 1257      Chief Complaint  Patient presents with  . Arm Pain    (Consider location/radiation/quality/duration/timing/severity/associated sxs/prior treatment) HPI  Patient presents to emergency department complaining of left upper arm/shoulder injury that happened just prior to arrival stating that she was bent over getting an object out of her cabinet and when she stood up to turn around her foot caught on the carpet causing her to fall forward and land onto her left shoulder causing acute onset pain and decreased range of motion due to pain. She denies numbness or tingling in UEs. Patient states she used a scarf to splint her arm close to her chest which mildly improved the pain however states there has been constant pain since onset. Patient denies hitting her head, loss of consciousness, neck pain, back pain, or any additional injury. Patient states she was able to stand to walk without any pain in her back, hips, pelvis, or lower extremities. Patient did not take anything for pain prior to arrival. Patient's primary care physician is Dr. Merri Brunette. Pain is aggravated by touch and movement and mildly improved with splinting. Patient denies any anticoagulant use.  Past Medical History  Diagnosis Date  . Hypertension   . Arthritis   . Spinal stenosis     Past Surgical History  Procedure Date  . Breast lumpectomy   . Abdominal hysterectomy   . Thyroid surgery   . Knee surgery   . Cholecystectomy     No family history on file.  History  Substance Use Topics  . Smoking status: Never Smoker   . Smokeless tobacco: Not on file  . Alcohol Use: No    OB History    Grav Para Term Preterm Abortions TAB SAB Ect Mult Living                  Review of Systems  All other systems reviewed and are negative.    Allergies  Actonel; Fosamax; Ketorolac; Nsaids;  Allopurinol; and Sulfa antibiotics  Home Medications   Current Outpatient Rx  Name Route Sig Dispense Refill  . VITAMIN C 500 MG PO CAPS Oral Take 500 mg by mouth daily.    . BUMETANIDE 2 MG PO TABS Oral Take 2 mg by mouth daily.    Marland Kitchen CALCIUM CARBONATE 600 MG PO TABS Oral Take 600 mg by mouth 2 (two) times daily with a meal.    . VITAMIN D 1000 UNITS PO CAPS Oral Take 1,000 Units by mouth daily.    Marland Kitchen EZETIMIBE-SIMVASTATIN 10-20 MG PO TABS Oral Take 1 tablet by mouth daily with breakfast.    . FEBUXOSTAT 80 MG PO TABS Oral Take 80 mg by mouth daily.    Marland Kitchen FEXOFENADINE HCL 180 MG PO TABS Oral Take 180 mg by mouth daily.    Marland Kitchen GLUCOSAMINE HCL 1000 MG PO TABS Oral Take 1,000 mg by mouth 2 (two) times daily.    Marland Kitchen LEVOTHYROXINE SODIUM 137 MCG PO TABS Oral Take 137 mcg by mouth daily.    . MULTI-VITAMIN/MINERALS PO TABS Oral Take 1 tablet by mouth daily.    Marland Kitchen PREDNISONE 5 MG PO TABS Oral Take 5 mg by mouth daily.    Marland Kitchen RALOXIFENE HCL 60 MG PO TABS Oral Take 60 mg by mouth daily.    . TRAMADOL HCL 50 MG PO TABS Oral Take  50 mg by mouth every 6 (six) hours as needed. pain    . VALSARTAN 160 MG PO TABS Oral Take 160 mg by mouth daily with breakfast.      BP 148/69  Pulse 88  Temp(Src) 98 F (36.7 C) (Oral)  Resp 16  Ht 5\' 2"  (1.575 m)  Wt 200 lb (90.719 kg)  BMI 36.58 kg/m2  SpO2 97%  Physical Exam  Nursing note and vitals reviewed. Constitutional: She is oriented to person, place, and time. She appears well-developed and well-nourished. No distress.  HENT:  Head: Normocephalic and atraumatic.  Eyes: Conjunctivae and EOM are normal. Pupils are equal, round, and reactive to light.  Neck: Normal range of motion. Neck supple.  Cardiovascular: Normal rate, regular rhythm, normal heart sounds and intact distal pulses.  Exam reveals no gallop and no friction rub.   No murmur heard. Pulmonary/Chest: Effort normal and breath sounds normal. No respiratory distress. She has no wheezes. She has no  rales. She exhibits no tenderness.  Abdominal: Soft. Bowel sounds are normal. She exhibits no distension and no mass. There is no tenderness. There is no rebound and no guarding.  Musculoskeletal: She exhibits tenderness. She exhibits no edema.       Right shoulder: Normal.       Left shoulder: She exhibits decreased range of motion, tenderness and pain. She exhibits no deformity and normal pulse.       Right elbow: Normal.      Left elbow: Normal.       Right wrist: Normal.       Left wrist: Normal.       Right hip: Normal.       Left hip: Normal.       Right knee: Normal.       Left knee: Normal.       Right ankle: Normal.       Left ankle: Normal.       Cervical back: Normal.       Thoracic back: Normal.       Lumbar back: Normal.       TTP of left upper arm/shoulder with decreased ROM due to pain. Arm kept adducted and flexed from elbow for comfort. No TTP of left wrist, elbow or clavicle. Good radial pulse, cap refill and sensation of entire LUE.     Neurological: She is alert and oriented to person, place, and time.  Skin: Skin is warm and dry. No rash noted. She is not diaphoretic. No erythema.  Psychiatric: She has a normal mood and affect.    ED Course  Procedures (including critical care time)  IV fentanyl  NPO in ER. Last eat/drink at 8am.   Dr. Victorino Dike is requesting CT scan of LUE, proximal humerus and he will see patient in ER for evaluation for ultimate dispo. Requests sling and NPO in the interim  PO percocet.   Labs Reviewed - No data to display Dg Humerus Left  11/09/2011  *RADIOLOGY REPORT*  Clinical Data: Left humeral pain post fall  LEFT HUMERUS - 2+ VIEW  Comparison: None  Findings: Osseous demineralization. Comminuted proximal left humeral fracture including comminuted fracture of the femoral head/neck as well as a long oblique fracture plane of the proximal left humeral metadiaphysis. Distal humerus appears intact. No definite evidence of dislocation.  Visualized portion of left scapula appears intact.  IMPRESSION: Comminuted displaced proximal humeral fracture as above.  Original Report Authenticated By: Lollie Marrow, M.D.     1.  Proximal humeral fracture     Dr. Victorino Dike met with patient and discussed about OP vs IP. She chose OP follow up on the 13th.   MDM  Closed, neurovasc intact fracture of proximal humerus.         [place], [place] 11/09/11 1624

## 2011-11-09 NOTE — ED Notes (Signed)
Pt reports she was at home, bent down to pick up a book when her R foot got caught on the carpet and fell-landing on her L shoulder.  Pt denies hitting her head therefore denies LOC at this time.  Pt is A&Ox 4.  Pt was able to support her L arm with her R hand to transfer from [facility] to the [employer].  Pt reports pain is in her L upper arm area-swelling and slight bruising noted.

## 2011-11-09 NOTE — Consult Note (Signed)
Reason for Consult:  Left proximal humerus fracture Referring Physician: Dr. Chiquita Loth Kari Martinez is an 78 y.o. female.  HPI: 78 y/o female fell at home today landing on left shoudler.  Denies any LOC or pain elswhere.  She c/o severe pain in L shoulder.  WOrse with motion and better when holding still.  Denies any numbness, tingling or weakness in L UE.  No h/o previous L shoulder injury or surgery.  No smoking hx.  Pt is hypothyroid but not diabetic.  Past Medical History  Diagnosis Date  . Hypertension   . Arthritis   . Spinal stenosis     Past Surgical History  Procedure Date  . Breast lumpectomy   . Abdominal hysterectomy   . Thyroid surgery   . Knee surgery   . Cholecystectomy     FH:  CAD in father  Social History:  reports that she has never smoked. She does not have any smokeless tobacco history on file. She reports that she does not drink alcohol or use illicit drugs.  Allergies:  Allergies  Allergen Reactions  . Actonel Other (See Comments)    Throat swelling and mouth swelling  . Fosamax Other (See Comments)    Throat swelling  . Ketorolac Other (See Comments)    "Retained fluid in lungs"  . Nsaids Other (See Comments)    Elevated kidney function  . Allopurinol Rash  . Sulfa Antibiotics Rash    Medications: I have reviewed the patient's current medications.  No results found for this or any previous visit (from the past 48 hour(s)).  Ct Humerus Left Wo Contrast  11/09/2011  *RADIOLOGY REPORT*  Clinical Data: Fall.  Proximal humeral fracture.  CT OF THE LEFT HUMERUS WITHOUT CONTRAST  Technique:  Multidetector CT imaging was performed according to the standard protocol. Multiplanar CT image reconstructions were also generated.  Comparison: 11/09/2011  Findings: Displaced fracture of the surgical neck of the humerus noted with mildly comminuted fracture the greater tuberosity.  No lesser tuberosity fracture is observed.  Extending distally from the anatomic neck  fracture, there is also a longitudinal fracture as shown on conventional radiography, which extends proximally 10.5 cm distal to the surgical neck fracture.  No other fracture of the visualized portion of the humerus is observed.  There appears to be some atelectasis in the visualized portion of the left lower lobe.  There is also evidence of old granulomatous disease of the spleen.  IMPRESSION:  1.  Three-part proximal humeral fracture with displaced surgical neck component mildly comminuted greater tuberosity component, but without lesser tuberosity fracture. 2.  There is also a longitudinal fracture extending distally from the anatomic neck fracture about 10.5 cm.  Original Report Authenticated By: Dellia Cloud, M.D.   Dg Humerus Left  11/09/2011  *RADIOLOGY REPORT*  Clinical Data: Left humeral pain post fall  LEFT HUMERUS - 2+ VIEW  Comparison: None  Findings: Osseous demineralization. Comminuted proximal left humeral fracture including comminuted fracture of the femoral head/neck as well as a long oblique fracture plane of the proximal left humeral metadiaphysis. Distal humerus appears intact. No definite evidence of dislocation. Visualized portion of left scapula appears intact.  IMPRESSION: Comminuted displaced proximal humeral fracture as above.  Original Report Authenticated By: Lollie Marrow, M.D.    ROS:  As above and o/w neg. PE: Blood pressure 139/52, pulse 83, temperature 97.7 F (36.5 C), temperature source Oral, resp. rate 20, height 5\' 2"  (1.575 m), weight 90.719 kg (200 lb),  SpO2 99.00%. wn wd female in nad.  EOMI.  A and O x 4.  Mood and affect normal.  Respirations unlabored.  L shoulder slightly swollen.  Skin intact.  No lymphadenopathy.  TTP diffusely.  Intact sens to LT in radial, ulnar andmedian nerve dist.  Intact strength in radial,ulnar and median nerve dist.    Assessment/Plan: L proximal humerus fracture - based on the CT and plain xrays this is quite a complex  fracture pattern, but is not terribly displaced.  The head is in some varus, but o/w well aligned.  We discussed the treatment options including sling immobilization until healing with early mobilization v. ORIF as well as the relevant risks and benefits of the treatment options.  At this point she prefers non-operative treatment, and I believe this is reasonable.  We'll immobilize her in a sling and discharge her to home under the care of her daughter.  I'll see her back in clinic in about 10 days.  We'll repeat her plain films to assess the alignment.  She understands the plan and agrees.  I provided my card and instructed the pt and her daughter to call for a f/u appt.  Toni Arthurs 11/09/2011, 4:51 PM

## 2011-11-17 ENCOUNTER — Ambulatory Visit: Payer: Medicare Other | Admitting: Physical Medicine & Rehabilitation

## 2011-12-08 ENCOUNTER — Ambulatory Visit: Payer: Medicare Other | Attending: Orthopedic Surgery | Admitting: Physical Therapy

## 2011-12-08 DIAGNOSIS — M25619 Stiffness of unspecified shoulder, not elsewhere classified: Secondary | ICD-10-CM | POA: Insufficient documentation

## 2011-12-08 DIAGNOSIS — M25519 Pain in unspecified shoulder: Secondary | ICD-10-CM | POA: Insufficient documentation

## 2011-12-08 DIAGNOSIS — R5381 Other malaise: Secondary | ICD-10-CM | POA: Insufficient documentation

## 2011-12-08 DIAGNOSIS — IMO0001 Reserved for inherently not codable concepts without codable children: Secondary | ICD-10-CM | POA: Insufficient documentation

## 2011-12-08 DIAGNOSIS — M6281 Muscle weakness (generalized): Secondary | ICD-10-CM | POA: Insufficient documentation

## 2011-12-10 ENCOUNTER — Ambulatory Visit: Payer: Medicare Other

## 2011-12-14 ENCOUNTER — Ambulatory Visit: Payer: Medicare Other | Admitting: Physical Therapy

## 2011-12-17 ENCOUNTER — Ambulatory Visit: Payer: Medicare Other

## 2011-12-21 ENCOUNTER — Ambulatory Visit: Payer: Medicare Other | Admitting: Physical Therapy

## 2011-12-23 ENCOUNTER — Ambulatory Visit: Payer: Medicare Other

## 2011-12-28 ENCOUNTER — Ambulatory Visit: Payer: Medicare Other

## 2011-12-30 ENCOUNTER — Ambulatory Visit: Payer: Medicare Other | Admitting: Physical Therapy

## 2012-01-04 ENCOUNTER — Ambulatory Visit: Payer: Medicare Other | Attending: Orthopedic Surgery

## 2012-01-04 DIAGNOSIS — M25519 Pain in unspecified shoulder: Secondary | ICD-10-CM | POA: Insufficient documentation

## 2012-01-04 DIAGNOSIS — R5381 Other malaise: Secondary | ICD-10-CM | POA: Insufficient documentation

## 2012-01-04 DIAGNOSIS — IMO0001 Reserved for inherently not codable concepts without codable children: Secondary | ICD-10-CM | POA: Insufficient documentation

## 2012-01-04 DIAGNOSIS — M25619 Stiffness of unspecified shoulder, not elsewhere classified: Secondary | ICD-10-CM | POA: Insufficient documentation

## 2012-01-04 DIAGNOSIS — M6281 Muscle weakness (generalized): Secondary | ICD-10-CM | POA: Insufficient documentation

## 2012-01-06 ENCOUNTER — Ambulatory Visit: Payer: Medicare Other | Admitting: Physical Therapy

## 2012-01-11 ENCOUNTER — Ambulatory Visit: Payer: Medicare Other

## 2012-01-12 ENCOUNTER — Encounter: Payer: Medicare Other | Attending: Physical Medicine & Rehabilitation

## 2012-01-12 ENCOUNTER — Encounter: Payer: Self-pay | Admitting: Physical Medicine & Rehabilitation

## 2012-01-12 ENCOUNTER — Ambulatory Visit (HOSPITAL_BASED_OUTPATIENT_CLINIC_OR_DEPARTMENT_OTHER): Payer: Medicare Other | Admitting: Physical Medicine & Rehabilitation

## 2012-01-12 VITALS — BP 148/51 | HR 108 | Resp 16 | Ht 62.0 in | Wt 194.0 lb

## 2012-01-12 DIAGNOSIS — S42309A Unspecified fracture of shaft of humerus, unspecified arm, initial encounter for closed fracture: Secondary | ICD-10-CM

## 2012-01-12 DIAGNOSIS — S7290XA Unspecified fracture of unspecified femur, initial encounter for closed fracture: Secondary | ICD-10-CM | POA: Insufficient documentation

## 2012-01-12 DIAGNOSIS — M47817 Spondylosis without myelopathy or radiculopathy, lumbosacral region: Secondary | ICD-10-CM

## 2012-01-12 DIAGNOSIS — M47816 Spondylosis without myelopathy or radiculopathy, lumbar region: Secondary | ICD-10-CM | POA: Insufficient documentation

## 2012-01-12 DIAGNOSIS — W19XXXA Unspecified fall, initial encounter: Secondary | ICD-10-CM | POA: Insufficient documentation

## 2012-01-12 DIAGNOSIS — S42302A Unspecified fracture of shaft of humerus, left arm, initial encounter for closed fracture: Secondary | ICD-10-CM

## 2012-01-12 DIAGNOSIS — M549 Dorsalgia, unspecified: Secondary | ICD-10-CM | POA: Insufficient documentation

## 2012-01-12 MED ORDER — TRAMADOL HCL 50 MG PO TABS: 100.0000 mg | ORAL_TABLET | Freq: Three times a day (TID) | ORAL | Status: DC | PRN

## 2012-01-12 NOTE — Progress Notes (Signed)
  Subjective:    Patient ID: Kari Martinez, female    DOB: [birth year], 78 y.o.   MRN: [id number]   HPI  Left humeral fracture following a fall in February did not require surgery but is going through physical therapy. Initially tried oxycodone after the fracture however found out that tramadol 2 tablets 3 times a day do just as well without causing any side effects. Pain Inventory Average Pain 0 Pain Right Now 0 My pain is intermittent  In the last 24 hours, has pain interfered with the following? General activity 1 Relation with others 0 Enjoyment of life 0 What TIME of day is your pain at its worst? daytime Sleep (in general) Fair  Pain is worse with: walking and standing Pain improves with: rest and medication Relief from Meds: 8  Mobility walk without assistance how many minutes can you walk? 20 ability to climb steps?  yes do you drive?  yes  Function retired  Neuro/Psych No problems in this area  Prior Studies Any changes since last visit?  no  Physicians involved in your care Any changes since last visit?  no  Review of Systems  Constitutional: Negative.   HENT: Negative.   Eyes: Negative.   Respiratory: Negative.   Cardiovascular: Negative.   Gastrointestinal: Negative.   Genitourinary: Negative.   Musculoskeletal: Negative.   Skin: Negative.   Neurological: Negative.   Hematological: Negative.        Objective:   Physical Exam  Constitutional: She is oriented to person, place, and time.  Musculoskeletal:       Left shoulder: She exhibits decreased range of motion.       Lumbar back: She exhibits decreased range of motion. She exhibits no tenderness.       Left shoulder abduction limited to 90 as well as forward flexion Lumbar range of motion is approximately 50% of normal for flexion, extension, lateral tension, and bending  Neurological: She is alert and oriented to person, place, and time. She has normal strength. No sensory deficit.    *RADIOLOGY REPORT* 11/09/2011 Clinical Data: Left humeral pain post fall  LEFT HUMERUS - 2+ VIEW  Comparison: None  Findings:  Osseous demineralization.  Comminuted proximal left humeral fracture including comminuted  fracture of the femoral head/neck as well as a long oblique  fracture plane of the proximal left humeral metadiaphysis.  Distal humerus appears intact.  No definite evidence of dislocation.  Visualized portion of left scapula appears intact.  IMPRESSION:  Comminuted displaced proximal humeral fracture as above.      Assessment & Plan:  And. Lumbar spondylosis her back pain is doing relatively well he continues to take her tramadol.  2. Left humeral fracture this is her main complaint and because of this she has increased her dose of tramadol to 2 tablets 3 times per day. We'll continue this current dose until she finishes her physical therapy. I'll check her back in approximately 6 weeks.

## 2012-01-12 NOTE — Patient Instructions (Signed)
Continue physical therapy Increase tramadol to 2 tablets 3 times per day Recheck in 6 weeks and if feeling better we may be a will to go down on the tramadol once again

## 2012-01-13 ENCOUNTER — Ambulatory Visit: Payer: Medicare Other | Admitting: Physical Therapy

## 2012-01-13 ENCOUNTER — Encounter: Payer: Self-pay | Admitting: Physical Medicine & Rehabilitation

## 2012-01-18 ENCOUNTER — Ambulatory Visit: Payer: Medicare Other

## 2012-01-20 ENCOUNTER — Ambulatory Visit: Payer: Medicare Other

## 2012-01-25 ENCOUNTER — Ambulatory Visit: Payer: Medicare Other

## 2012-01-27 ENCOUNTER — Ambulatory Visit: Payer: Medicare Other

## 2012-02-01 ENCOUNTER — Ambulatory Visit: Payer: Medicare Other

## 2012-02-03 ENCOUNTER — Ambulatory Visit: Payer: Medicare Other | Attending: Orthopedic Surgery

## 2012-02-03 DIAGNOSIS — IMO0001 Reserved for inherently not codable concepts without codable children: Secondary | ICD-10-CM | POA: Insufficient documentation

## 2012-02-03 DIAGNOSIS — M25519 Pain in unspecified shoulder: Secondary | ICD-10-CM | POA: Insufficient documentation

## 2012-02-03 DIAGNOSIS — M6281 Muscle weakness (generalized): Secondary | ICD-10-CM | POA: Insufficient documentation

## 2012-02-03 DIAGNOSIS — R5381 Other malaise: Secondary | ICD-10-CM | POA: Insufficient documentation

## 2012-02-03 DIAGNOSIS — M25619 Stiffness of unspecified shoulder, not elsewhere classified: Secondary | ICD-10-CM | POA: Insufficient documentation

## 2012-02-16 ENCOUNTER — Encounter: Payer: Self-pay | Admitting: Physical Medicine & Rehabilitation

## 2012-02-16 ENCOUNTER — Encounter: Payer: Medicare Other | Attending: Physical Medicine & Rehabilitation

## 2012-02-16 ENCOUNTER — Ambulatory Visit (HOSPITAL_BASED_OUTPATIENT_CLINIC_OR_DEPARTMENT_OTHER): Payer: Medicare Other | Admitting: Physical Medicine & Rehabilitation

## 2012-02-16 VITALS — BP 140/51 | HR 92 | Resp 16 | Ht 62.0 in | Wt 194.0 lb

## 2012-02-16 DIAGNOSIS — S7290XA Unspecified fracture of unspecified femur, initial encounter for closed fracture: Secondary | ICD-10-CM | POA: Insufficient documentation

## 2012-02-16 DIAGNOSIS — M47816 Spondylosis without myelopathy or radiculopathy, lumbar region: Secondary | ICD-10-CM

## 2012-02-16 DIAGNOSIS — M47817 Spondylosis without myelopathy or radiculopathy, lumbosacral region: Secondary | ICD-10-CM

## 2012-02-16 DIAGNOSIS — M549 Dorsalgia, unspecified: Secondary | ICD-10-CM | POA: Insufficient documentation

## 2012-02-16 DIAGNOSIS — W19XXXA Unspecified fall, initial encounter: Secondary | ICD-10-CM | POA: Insufficient documentation

## 2012-02-16 NOTE — Progress Notes (Signed)
  Subjective:    Patient ID: Kari Martinez, female    DOB: 06-Feb-1924, 78 y.o.   MRN: [id number]  HPI  No new medical problems. Has completed physical therapy for her left humerus fracture Pain Inventory Average Pain 1 Pain Right Now 1 My pain is intermittent and dull  In the last 24 hours, has pain interfered with the following? General activity 1 Relation with others 0 Enjoyment of life 0 What TIME of day is your pain at its worst? daytime Sleep (in general) Fair  Pain is worse with: walking and standing Pain improves with: rest and medication Relief from Meds: 9  Mobility walk without assistance how many minutes can you walk? 20 ability to climb steps?  yes do you drive?  yes  Function retired  Neuro/Psych trouble walking  Prior Studies Any changes since last visit?  no  Physicians involved in your care Any changes since last visit?  no     Review of Systems  Respiratory: Positive for shortness of breath.   Musculoskeletal: Positive for gait problem.  All other systems reviewed and are negative.       Objective:   Physical Exam  Left shoulder 4 minus/5 otherwise 5/5 in the upper and lower extremities Back has no tenderness to palpation along the lumbar paraspinal muscles. There is no spinal deformity. Lumbar range of motion is full with forward flexion but 0-25% with extension. Inhalation is without evidence of toe drag or knee instability Mood and affect are appropriate  General no acute distress     Assessment & Plan:  01. Lumbar spondylosis 1 year post medial branch blocks still doing well on tramadol. We'll see the patient back in 6 months. If the pain comes back prior to 6 months we'll repeat the medial branch block

## 2012-02-16 NOTE — Patient Instructions (Signed)
pleasecall if your pain starts getting worse again and we can schedule you for lumbar medial branch block

## 2012-03-25 ENCOUNTER — Other Ambulatory Visit: Payer: Self-pay | Admitting: Physical Medicine & Rehabilitation

## 2012-03-29 ENCOUNTER — Other Ambulatory Visit: Payer: Self-pay | Admitting: Physical Medicine & Rehabilitation

## 2012-05-30 ENCOUNTER — Other Ambulatory Visit: Payer: Self-pay | Admitting: Physical Medicine & Rehabilitation

## 2012-06-14 ENCOUNTER — Other Ambulatory Visit (HOSPITAL_COMMUNITY): Payer: Self-pay | Admitting: Family Medicine

## 2012-06-14 DIAGNOSIS — Z1231 Encounter for screening mammogram for malignant neoplasm of breast: Secondary | ICD-10-CM

## 2012-06-17 ENCOUNTER — Ambulatory Visit (HOSPITAL_COMMUNITY)
Admission: RE | Admit: 2012-06-17 | Discharge: 2012-06-17 | Disposition: A | Discharge status: Home / Self Care | Payer: Medicare Other | Source: Ambulatory Visit | Attending: Family Medicine | Admitting: Family Medicine

## 2012-06-17 DIAGNOSIS — Z1231 Encounter for screening mammogram for malignant neoplasm of breast: Secondary | ICD-10-CM | POA: Insufficient documentation

## 2012-06-21 ENCOUNTER — Other Ambulatory Visit: Payer: Self-pay | Admitting: Family Medicine

## 2012-06-21 DIAGNOSIS — R928 Other abnormal and inconclusive findings on diagnostic imaging of breast: Secondary | ICD-10-CM

## 2012-06-22 ENCOUNTER — Ambulatory Visit
Admission: RE | Admit: 2012-06-22 | Discharge: 2012-06-22 | Disposition: A | Discharge status: Home / Self Care | Payer: Medicare Other | Source: Ambulatory Visit | Attending: Family Medicine | Admitting: Family Medicine

## 2012-06-22 DIAGNOSIS — R928 Other abnormal and inconclusive findings on diagnostic imaging of breast: Secondary | ICD-10-CM

## 2012-06-27 ENCOUNTER — Ambulatory Visit (HOSPITAL_BASED_OUTPATIENT_CLINIC_OR_DEPARTMENT_OTHER): Payer: Medicare Other | Admitting: Physical Medicine & Rehabilitation

## 2012-06-27 ENCOUNTER — Encounter: Payer: Self-pay | Admitting: Physical Medicine & Rehabilitation

## 2012-06-27 ENCOUNTER — Encounter: Payer: Medicare Other | Attending: Physical Medicine & Rehabilitation

## 2012-06-27 VITALS — BP 135/68 | HR 102 | Resp 14 | Ht 62.0 in | Wt 200.4 lb

## 2012-06-27 DIAGNOSIS — M47817 Spondylosis without myelopathy or radiculopathy, lumbosacral region: Secondary | ICD-10-CM | POA: Insufficient documentation

## 2012-06-27 DIAGNOSIS — R269 Unspecified abnormalities of gait and mobility: Secondary | ICD-10-CM | POA: Insufficient documentation

## 2012-06-27 DIAGNOSIS — M47816 Spondylosis without myelopathy or radiculopathy, lumbar region: Secondary | ICD-10-CM

## 2012-06-27 DIAGNOSIS — M549 Dorsalgia, unspecified: Secondary | ICD-10-CM | POA: Insufficient documentation

## 2012-06-27 MED ORDER — HYDROCODONE-ACETAMINOPHEN 7.5-325 MG PO TABS: 1.0000 | ORAL_TABLET | Freq: Four times a day (QID) | ORAL | Status: DC | PRN

## 2012-06-27 NOTE — Progress Notes (Signed)
  Subjective:    Patient ID: Kari Martinez, female    DOB: 1924-07-29, 78 y.o.   MRN: [id number]  HPI Patient with lumbar spondylosis. Last seen by me May of 2013. Last medial branch blocks May of 2012L3-L4-L5 levels bilateral. Recurrence of pain started after a long road trip last month. No lower remedy pain no bowel or bladder dysfunction that is new.  Interval medical history some swelling and weeping of ankles Pain Inventory Average Pain 10 Pain Right Now 10 My pain is constant and sharp  In the last 24 hours, has pain interfered with the following? General activity 9 Relation with others 9 Enjoyment of life 10 What TIME of day is your pain at its worst? all of the time Sleep (in general) Fair  Pain is worse with: walking and standing Pain improves with: medication Relief from Meds: 4  Mobility walk with assistance how many minutes can you walk? 5 ability to climb steps?  yes do you drive?  yes  Function retired I need assistance with the following:  household duties  Neuro/Psych trouble walking  Prior Studies Any changes since last visit?  no  Physicians involved in your care Any changes since last visit?  no   Family History  Problem Relation Age of Onset  . Heart disease Father    History   Social History  . Marital Status: Widowed    Spouse Name: N/A    Number of Children: N/A  . Years of Education: N/A   Social History Main Topics  . Smoking status: Never Smoker   . Smokeless tobacco: Never Used  . Alcohol Use: No  . Drug Use: No  . Sexually Active: None   Other Topics Concern  . None   Social History Narrative  . None   Past Surgical History  Procedure Date  . Breast lumpectomy   . Abdominal hysterectomy   . Thyroid surgery   . Knee surgery   . Cholecystectomy   . Tonsillectomy   . Carpal tunnel release   . Lump in breast   . Gallbladder surgery   . Meniscus repair    Past Medical History  Diagnosis Date  . Hypertension   .  Arthritis   . Spinal stenosis   . Left humeral fracture Category 01/23/2012   BP 135/68  Pulse 102  Resp 14  Ht 5\' 2"  (1.575 m)  Wt 200 lb 6.4 oz (90.901 kg)  BMI 36.65 kg/m2  SpO2 92%     Review of Systems  Musculoskeletal: Positive for back pain and gait problem.  All other systems reviewed and are negative.       Objective:   Physical Exam 1+ edema both ankles no skin changes No tenderness palpation the lumbar spine there is mild scoliosis in the thoracic along the lumbar junction Extremity strength is normal Lower extremity sensation is normal Lobes from a strength is normal Range of motion reduced with extension of the lumbar spine but normal with flexion       Assessment & Plan:  01. Lumbar spondylosis without myelopathy. She's had recurrence of symptoms after long car trip. Will repeat medial branch blocks. Hydrocodone 7.5/325 up to 4 times per day #30 no refills. She can continue the tramadol

## 2012-06-27 NOTE — Patient Instructions (Signed)
We will repeat your lumbar medial branch blocks next week I have prescribed hydrocodone 30 tablets to get you through. Do not take more than 4 day

## 2012-07-04 ENCOUNTER — Ambulatory Visit (HOSPITAL_BASED_OUTPATIENT_CLINIC_OR_DEPARTMENT_OTHER): Payer: Medicare Other | Admitting: Physical Medicine & Rehabilitation

## 2012-07-04 ENCOUNTER — Encounter: Payer: Self-pay | Admitting: Physical Medicine & Rehabilitation

## 2012-07-04 VITALS — BP 159/63 | HR 97 | Resp 14 | Ht 62.0 in | Wt 197.0 lb

## 2012-07-04 DIAGNOSIS — M47816 Spondylosis without myelopathy or radiculopathy, lumbar region: Secondary | ICD-10-CM

## 2012-07-04 DIAGNOSIS — M47817 Spondylosis without myelopathy or radiculopathy, lumbosacral region: Secondary | ICD-10-CM

## 2012-07-04 NOTE — Progress Notes (Signed)
  Subjective:    Patient ID: Kari Martinez, female    DOB: [birth year], 78 y.o.   MRN: [id number]  HPI    Review of Systems     Objective:   Physical Exam        Assessment & Plan:  Bilateral Lumbar L3, L4  medial branch blocks and L 5 dorsal ramus injection under fluoroscopic guidance  Indication: Lumbar pain which is not relieved by medication management or other conservative care and interfering with self-care and mobility.  Informed consent was obtained after describing risks and benefits of the procedure with the patient, this includes bleeding, infection, paralysis and medication side effects.  The patient wishes to proceed and has given written consent.  The patient was placed in prone position.  The lumbar area was marked and prepped with Betadine.  One mL of 1% lidocaine was injected into each of 6 areas into the skin and subcutaneous tissue.  Then a 22-gauge 3.5 spinal needle was inserted targeting the junction of the left S1 superior articular process and sacral ala junction. Needle was advanced under fluoroscopic guidance.  Bone contact was made.  Omnipaque 180 was injected x 0.5 mL demonstrating no intravascular uptake.  Then a solution containing one mL of 4 mg per mL dexamethasone and 3 mL of 2% MPF lidocaine was injected x 0.5 mL.  Then the left L5 superior articular process in transverse process junction was targeted.  Bone contact was made.  Omnipaque 180 was injected x 0.5 mL demonstrating no intravascular uptake. Then a solution containing one mL of 4 mg per mL dexamethasone and 3 mL of 2% MPF lidocaine was injected x 0.5 mL.  Then the left L4 superior articular process in transverse process junction was targeted.  Bone contact was made.  Omnipaque 180 was injected x 0.5 mL demonstrating no intravascular uptake.  Then a solution containing one mL of 4 mg per mL dexamethasone and 3 mL if 2% MPF lidocaine was injected x 0.5 mL.  This same procedure was performed on the right side  using the same needle, technique and injectate.  Patient tolerated procedure well.  Post procedure instructions were given.

## 2012-07-04 NOTE — Progress Notes (Signed)
  PROCEDURE RECORD [facility]   Name: SCHERRIE BAECHLE DOB:1923/12/24 MRN: [id number]  Date:07/04/2012  Physician: Claudette Laws, MD    Nurse/CMA: Kerin Perna  Allergies:  Allergies  Allergen Reactions  . Alendronate Sodium Other (See Comments)    Throat swelling  . Ketorolac Other (See Comments)    "Retained fluid in lungs"  . Ketorolac Tromethamine Other (See Comments)    FLUID RETENTION  . Nsaids Other (See Comments)    Elevated kidney function  . Risedronate Sodium Other (See Comments)    Throat swelling and mouth swelling  . Vioxx (Rofecoxib)   . Allopurinol Rash  . Sulfa Antibiotics Rash    Consent Signed: yes  Is patient diabetic? no   Pregnant: no LMP: No LMP recorded. Patient is postmenopausal. (age 13-55)  Anticoagulants: no Anti-inflammatory: no Antibiotics: no  Procedure: Medial Branch Block  Position: Prone Start Time:  1:48pm End Time:  2:02pm Fluoro Time: 32  RN/CMA Tamika Nou, CMA Feleshia Zundel,CMA    Time 132pm 2:07pm    BP 159/63 150/53    Pulse 97 97    Respirations 14 14    O2 Sat 96 84    S/S 6 6    Pain Level 9/10 5/10     D/C home with DaughterFannie Knee, patient A & O X 3, D/C instructions reviewed, and sits independently.

## 2012-07-04 NOTE — Patient Instructions (Signed)
One month followup Please let me know if this is not helpful for you We did bilateral L3-L4-L5 medial branch blocks. You have a lumbar spondylosis and scoliosis

## 2012-07-11 ENCOUNTER — Telehealth: Payer: Self-pay | Admitting: Physical Medicine & Rehabilitation

## 2012-07-11 NOTE — Telephone Encounter (Signed)
Had MBB last week.  Still in considerable pain in mornings, pain level 9-7.  In afternoons pain level down to 2-3.  What should she do about pain in the mornings?

## 2012-07-11 NOTE — Telephone Encounter (Signed)
She may try taking 2 tramadol in the mornings, if this does not work take one tramadol and one Aleve in the mornings. If this does not work then she may try the hydrocodone only in the mornings

## 2012-07-11 NOTE — Telephone Encounter (Signed)
Kari Martinez is still having pain in the mornings and the tramadol is not covering it.  She still has a few hydrocodone that you prescribed her earlier but she has not taken that.  The pain is better than before the injection but it is mainly in the morning that it is the worst. Please advise.

## 2012-07-12 ENCOUNTER — Telehealth: Payer: Self-pay | Admitting: Physical Medicine & Rehabilitation

## 2012-07-12 NOTE — Telephone Encounter (Signed)
Notified  Kari Martinez of Dr Wynn Banker directions,

## 2012-07-12 NOTE — Telephone Encounter (Signed)
I called and left a message with the patient. PLEASE SEE TELEPHONE ENCOUNTER FROM 07/11/12.

## 2012-07-12 NOTE — Telephone Encounter (Signed)
Please call about this pain she is having!

## 2012-07-18 ENCOUNTER — Other Ambulatory Visit: Payer: Self-pay | Admitting: *Deleted

## 2012-07-18 NOTE — Telephone Encounter (Signed)
Hydrocodone is the only thing giving her relief. She will need a refill. Please call.

## 2012-07-18 NOTE — Telephone Encounter (Signed)
You last gave her a prescription on 06/27/12 for Hydrocodone 7.5/325 1 Q 6hrs PRN #30. Would you like to refill this, change the dosage or quantity? Thanks.

## 2012-07-19 ENCOUNTER — Telehealth: Payer: Self-pay | Admitting: Physical Medicine & Rehabilitation

## 2012-07-19 MED ORDER — HYDROCODONE-ACETAMINOPHEN 7.5-325 MG PO TABS: 1.0000 | ORAL_TABLET | Freq: Four times a day (QID) | ORAL | Status: DC | PRN

## 2012-07-19 NOTE — Telephone Encounter (Signed)
Per Dr Wynn Banker we can refill this for her. I will call this in. She is aware.

## 2012-07-19 NOTE — Telephone Encounter (Signed)
ok 

## 2012-07-19 NOTE — Telephone Encounter (Signed)
This has been called in. Pt aware.

## 2012-07-19 NOTE — Telephone Encounter (Signed)
2nd call, refill on Hydrocodone

## 2012-07-26 ENCOUNTER — Telehealth: Payer: Self-pay | Admitting: Physical Medicine & Rehabilitation

## 2012-07-26 NOTE — Telephone Encounter (Signed)
Lm advising patient to call pharmacy.  Medication was called in 07/19/12, she should not be out yet.

## 2012-07-26 NOTE — Telephone Encounter (Signed)
Will be out of Hydrocodone in a couple of days

## 2012-07-29 ENCOUNTER — Other Ambulatory Visit: Payer: Self-pay | Admitting: Physical Medicine & Rehabilitation

## 2012-08-01 ENCOUNTER — Encounter: Payer: Self-pay | Admitting: Physical Medicine & Rehabilitation

## 2012-08-01 ENCOUNTER — Ambulatory Visit (HOSPITAL_BASED_OUTPATIENT_CLINIC_OR_DEPARTMENT_OTHER): Payer: Medicare Other | Admitting: Physical Medicine & Rehabilitation

## 2012-08-01 ENCOUNTER — Encounter: Payer: Medicare Other | Attending: Physical Medicine & Rehabilitation

## 2012-08-01 VITALS — BP 148/56 | HR 105 | Resp 14 | Ht 62.0 in | Wt 198.0 lb

## 2012-08-01 DIAGNOSIS — M47816 Spondylosis without myelopathy or radiculopathy, lumbar region: Secondary | ICD-10-CM

## 2012-08-01 DIAGNOSIS — M549 Dorsalgia, unspecified: Secondary | ICD-10-CM | POA: Insufficient documentation

## 2012-08-01 DIAGNOSIS — M47817 Spondylosis without myelopathy or radiculopathy, lumbosacral region: Secondary | ICD-10-CM | POA: Insufficient documentation

## 2012-08-01 DIAGNOSIS — R269 Unspecified abnormalities of gait and mobility: Secondary | ICD-10-CM | POA: Insufficient documentation

## 2012-08-01 DIAGNOSIS — Z5181 Encounter for therapeutic drug level monitoring: Secondary | ICD-10-CM

## 2012-08-01 MED ORDER — HYDROCODONE-ACETAMINOPHEN 7.5-325 MG PO TABS: 1.0000 | ORAL_TABLET | Freq: Four times a day (QID) | ORAL | Status: DC | PRN

## 2012-08-01 NOTE — Patient Instructions (Signed)
Next visit will be for lumbar medial branch radiofrequency procedure on the left side

## 2012-08-01 NOTE — Progress Notes (Signed)
  Subjective:    Patient ID: Kari Martinez, female    DOB: [birth year], 78 y.o.   MRN: [id number]  HPI For today's relief at 50% with last set of medial branch blocks. Patient is discouraged because blocks only lasted 4 days. Daughter notes that patient's gait is more forward flexed. Pain Inventory Average Pain 8 Pain Right Now 4 My pain is constant, sharp and stabbing  In the last 24 hours, has pain interfered with the following? General activity 7 Relation with others 7 Enjoyment of life 9 What TIME of day is your pain at its worst? daytime Sleep (in general) Fair  Pain is worse with: walking and standing Pain improves with: medication Relief from Meds: 6  Mobility walk without assistance how many minutes can you walk? 5-10 ability to climb steps?  yes do you drive?  yes  Function retired I need assistance with the following:  household duties  Neuro/Psych trouble walking  Prior Studies Any changes since last visit?  no  Physicians involved in your care Any changes since last visit?  no   Family History  Problem Relation Age of Onset  . Heart disease Father    History   Social History  . Marital Status: Widowed    Spouse Name: N/A    Number of Children: N/A  . Years of Education: N/A   Social History Main Topics  . Smoking status: Never Smoker   . Smokeless tobacco: Never Used  . Alcohol Use: No  . Drug Use: No  . Sexually Active: None   Other Topics Concern  . None   Social History Narrative  . None   Past Surgical History  Procedure Date  . Breast lumpectomy   . Abdominal hysterectomy   . Thyroid surgery   . Knee surgery   . Cholecystectomy   . Tonsillectomy   . Carpal tunnel release   . Lump in breast   . Gallbladder surgery   . Meniscus repair    Past Medical History  Diagnosis Date  . Hypertension   . Arthritis   . Spinal stenosis   . Left humeral fracture Category 01/23/2012   BP 148/56  Pulse 105  Resp 14  Ht 5\' 2"  (1.575  m)  Wt 198 lb (89.812 kg)  BMI 36.21 kg/m2  SpO2 96%     Review of Systems  Respiratory: Positive for shortness of breath.   Musculoskeletal: Positive for myalgias, back pain, arthralgias and gait problem.  All other systems reviewed and are negative.       Objective:   Physical Exam   Pain with extension of the lumbar spine. Ambulation is forward flexed posture. Levo convexed scoliosis thoraco lumbar Straight leg raising test is negative Hip range of motion is reduced internal and external rotation bilaterally but worse on the left Motor strength is 5/5 in bilateral Lower extremities. Deep tendon reflexes are normal    Assessment & Plan:  1. Lumbar scoliosis and lumbar spondylosis. No evidence of nerve root impingement. Short-term relief of greater than 50% on more than 2 occasions using medial branch blocks L3-L4 and L5 dorsal ramus injection. Recommend radiofrequency as she is now requiring narcotic analgesics to relieve her pain. Will set her up in one to 2 weeks

## 2012-08-02 ENCOUNTER — Telehealth: Payer: Self-pay | Admitting: Physical Medicine & Rehabilitation

## 2012-08-02 NOTE — Telephone Encounter (Signed)
Hydrocodone called in.  Pt aware.

## 2012-08-02 NOTE — Telephone Encounter (Signed)
Hydrocodone not called in.

## 2012-08-08 ENCOUNTER — Encounter: Payer: Medicare Other | Attending: Physical Medicine & Rehabilitation

## 2012-08-08 ENCOUNTER — Encounter: Payer: Self-pay | Admitting: Physical Medicine & Rehabilitation

## 2012-08-08 ENCOUNTER — Ambulatory Visit (HOSPITAL_BASED_OUTPATIENT_CLINIC_OR_DEPARTMENT_OTHER): Payer: Medicare Other | Admitting: Physical Medicine & Rehabilitation

## 2012-08-08 VITALS — BP 150/56 | HR 82 | Resp 14 | Ht 62.0 in | Wt 201.0 lb

## 2012-08-08 DIAGNOSIS — M549 Dorsalgia, unspecified: Secondary | ICD-10-CM | POA: Insufficient documentation

## 2012-08-08 DIAGNOSIS — M47817 Spondylosis without myelopathy or radiculopathy, lumbosacral region: Secondary | ICD-10-CM | POA: Insufficient documentation

## 2012-08-08 DIAGNOSIS — M47816 Spondylosis without myelopathy or radiculopathy, lumbar region: Secondary | ICD-10-CM

## 2012-08-08 DIAGNOSIS — R269 Unspecified abnormalities of gait and mobility: Secondary | ICD-10-CM | POA: Insufficient documentation

## 2012-08-08 MED ORDER — HYDROCODONE-ACETAMINOPHEN 7.5-325 MG PO TABS: 1.0000 | ORAL_TABLET | Freq: Four times a day (QID) | ORAL | Status: DC | PRN

## 2012-08-08 MED ORDER — GABAPENTIN 600 MG PO TABS: 600.0000 mg | ORAL_TABLET | Freq: Three times a day (TID) | ORAL | Status: DC

## 2012-08-08 NOTE — Progress Notes (Signed)
Left L5 dorsal ramus., left L4 and left L3 medial branch radio frequency neuropathy under fluoroscopic guidance  Indication: Low back pain due to lumbar spondylosis which has been relieved on 2 occasions by greater than 50% by lumbar medial branch blocks at corresponding levels.  Informed consent was obtained after describing risks and benefits of the procedure with the patient, this includes bleeding, bruising, infection, paralysis and medication side effects. The patient wishes to proceed and has given written consent. The patient was placed in a prone position. The lumbar and sacral area was marked and prepped with Betadine. A 25-gauge 1-1/2 inch needle was inserted into the skin and subcutaneous tissue at 3 sites in one ML of 1% lidocaine was injected into each site. Then a 20-gauge 10 cm radio frequency needle with a 1 cm curved active tip was inserted targeting the left S1 SAP/sacral ala junction. Bone contact was made and confirmed with lateral imaging. Sensory stimulation at 50 Hz followed by motor stimulation at 2 Hz confirm proper needle location followed by injection of one ML of the solution containing one ML of 4 mg per mL dexamethasone and 3 mL of 1% MPF lidocaine. Then the left L5 SAP/transverse process junction was targeted. Bone contact was made and confirmed with lateral imaging. Sensory stimulation at 50 Hz followed by motor stimulation at 2 Hz confirm proper needle location followed by injection of one ML of the solution containing one ML of 4 mg per mL dexamethasone and 3 mL of 1% MPF lidocaine. Then the left L4 SAP/transverse process junction was targeted. Bone contact was made and confirmed with lateral imaging. Sensory stimulation at 50 Hz followed by motor stimulation at 2 Hz confirm proper needle location followed by injection of one ML of the solution containing one ML of 4 mg per mL dexamethasone and 3 mL of 1% MPF lidocaine. Radio frequency lesion being at 80C for 90 seconds was  performed. Needles were removed. Post procedure instructions and vital signs were performed. Patient tolerated procedure well. Followup appointment was given.  

## 2012-08-08 NOTE — Patient Instructions (Addendum)
Radiofrequency Lesioning Care After Refer to this sheet in the next few weeks. These instructions provide you with information on caring for yourself after your procedure. Your caregiver may also give you more specific instructions. Your treatment has been planned according to current medical practices, but problems sometimes occur. Call your caregiver if you have any problems or questions after your procedure. HOME CARE INSTRUCTIONS  Take pain medicine as directed by your caregiver.  You may have pain from the burned nerve for a while after the procedure. This takes time to heal. Ask your caregiver how long you should expect pain.  You should be able to return to your normal activities a couple of days after the procedure. When you can go back to work will depend on the type of work you do. Discuss this with your caregiver.  Pay close attention to how you feel after the procedure. If you start having pain, write down when it hurts and how it feels. This will help you and your caregiver know if you need another treatment. SEEK MEDICAL CARE IF:  The site where the needle was inserted for the procedure becomes red, swollen, or tender to touch.  Your pain does not get better. SEEK IMMEDIATE MEDICAL CARE IF:  You develop sudden, severe pain.  You develop numbness or tingling near the procedure site.  You have a fever. MAKE SURE YOU:  Understand these instructions.  Will watch your condition.  Will get help right away if you are not doing well or get worse. Document Released: 05/20/2011 Document Revised: 12/14/2011 Document Reviewed: 05/20/2011 [facility] Patient Information 2013 [place], [place].    Pick up your prescription for gabapentin take 3 tablets tonight only

## 2012-08-08 NOTE — Progress Notes (Signed)
  PROCEDURE RECORD [facility]   Name: Kari Martinez DOB:1924-10-05 MRN: [id number]  Date:08/08/2012  Physician: Claudette Laws, MD    Nurse/CMA: Kelli Churn, RN  Allergies:  Allergies  Allergen Reactions  . Alendronate Sodium Other (See Comments)    Throat swelling  . Ketorolac Other (See Comments)    "Retained fluid in lungs"  . Ketorolac Tromethamine Other (See Comments)    FLUID RETENTION  . Nsaids Other (See Comments)    Elevated kidney function  . Risedronate Sodium Other (See Comments)    Throat swelling and mouth swelling  . Vioxx (Rofecoxib)   . Allopurinol Rash  . Sulfa Antibiotics Rash    Consent Signed: yes  Is patient diabetic? no    Pregnant: no LMP: No LMP recorded. Patient is postmenopausal. (age 60-55)  Anticoagulants: no Anti-inflammatory: no Antibiotics: no  Procedure: Left Radiofrequency Neurotomy  Position: Prone Start Time:  2:52 End Time: 3:12pm  Fluoro Time: 37 sec  RN/CMA Shumaker, Manufacturing systems engineer RN    Time 222pm 3:20pm    BP 150/56 150/57    Pulse 82 80    Respirations 14 14    O2 Sat 96 97    S/S 6 6    Pain Level 9/10 9/10     D/C home with Daughter-[name], patient A & O X 3, D/C instructions reviewed, and sits independently.

## 2012-08-15 ENCOUNTER — Ambulatory Visit: Payer: Medicare Other | Admitting: Physical Medicine & Rehabilitation

## 2012-08-16 ENCOUNTER — Telehealth: Payer: Self-pay

## 2012-08-16 NOTE — Telephone Encounter (Signed)
Patient is needing another refill.  Can we increase her quantity so she does not need to keep requesting.

## 2012-08-16 NOTE — Telephone Encounter (Signed)
Hydrocodone refill.  [facility] on [address]

## 2012-08-17 MED ORDER — HYDROCODONE-ACETAMINOPHEN 7.5-325 MG PO TABS: 1.0000 | ORAL_TABLET | Freq: Four times a day (QID) | ORAL | Status: DC | PRN

## 2012-08-17 NOTE — Telephone Encounter (Signed)
Hydrocodone refilled patient aware and told to cut back to prevent fall risk.

## 2012-08-17 NOTE — Telephone Encounter (Signed)
i never saw this patient, he is 78 years old, increasing his narcotics will increase his fall risk, but because I do not know this patient at all, please ask Dr. Doroteo Bradford

## 2012-08-26 ENCOUNTER — Telehealth: Payer: Self-pay | Admitting: *Deleted

## 2012-08-26 MED ORDER — HYDROCODONE-ACETAMINOPHEN 7.5-325 MG PO TABS: 1.0000 | ORAL_TABLET | Freq: Two times a day (BID) | ORAL | Status: DC

## 2012-08-26 NOTE — Telephone Encounter (Signed)
Patient is requesting a refill on her Hydrocodone. Will run out this Sunday.

## 2012-08-26 NOTE — Telephone Encounter (Signed)
Per Dr Wynn Banker changed hydrocodone to bid for 30 days.  Patient says she does not think she can manage on that.  Offered her an appointment and she said she will follow up later.

## 2012-09-15 ENCOUNTER — Ambulatory Visit (HOSPITAL_BASED_OUTPATIENT_CLINIC_OR_DEPARTMENT_OTHER): Payer: Medicare Other | Admitting: Physical Medicine & Rehabilitation

## 2012-09-15 ENCOUNTER — Encounter: Payer: Self-pay | Admitting: Physical Medicine & Rehabilitation

## 2012-09-15 ENCOUNTER — Encounter: Payer: Medicare Other | Attending: Physical Medicine & Rehabilitation

## 2012-09-15 VITALS — BP 150/72 | HR 77 | Resp 14 | Ht 62.0 in | Wt 198.0 lb

## 2012-09-15 DIAGNOSIS — M47817 Spondylosis without myelopathy or radiculopathy, lumbosacral region: Secondary | ICD-10-CM

## 2012-09-15 DIAGNOSIS — R269 Unspecified abnormalities of gait and mobility: Secondary | ICD-10-CM | POA: Insufficient documentation

## 2012-09-15 DIAGNOSIS — M47816 Spondylosis without myelopathy or radiculopathy, lumbar region: Secondary | ICD-10-CM

## 2012-09-15 DIAGNOSIS — M549 Dorsalgia, unspecified: Secondary | ICD-10-CM | POA: Insufficient documentation

## 2012-09-15 MED ORDER — HYDROCODONE-ACETAMINOPHEN 7.5-325 MG PO TABS: 1.0000 | ORAL_TABLET | Freq: Two times a day (BID) | ORAL | Status: DC

## 2012-09-15 NOTE — Progress Notes (Signed)
  PROCEDURE RECORD [facility]   Name: TAMEY WANEK DOB:[birth year] MRN: [id number]  Date:09/15/2012  Physician: Claudette Laws, MD    Nurse/CMA: Yanette Tripoli,CMA/Shumaker, RN  Allergies:  Allergies  Allergen Reactions  . Alendronate Sodium Other (See Comments)    Throat swelling  . Ketorolac Other (See Comments)    "Retained fluid in lungs"  . Ketorolac Tromethamine Other (See Comments)    FLUID RETENTION  . Nsaids Other (See Comments)    Elevated kidney function  . Risedronate Sodium Other (See Comments)    Throat swelling and mouth swelling  . Vioxx (Rofecoxib)   . Allopurinol Rash  . Sulfa Antibiotics Rash    Consent Signed: yes  Is patient diabetic? no    Pregnant: no LMP: No LMP recorded. Patient is postmenopausal. (age 76-55)  Anticoagulants: no Anti-inflammatory: no Antibiotics: no  Procedure: Right Lumbar Radiofrequency Neurotomy  Position: Prone Start Time: 1:28pm End Time: 1:48 Fluoro Time: 41  RN/CMA Chazz Philson,CMA Shumaker,RN    Time 12:38 pm 1:52    BP 150/72 181/81    Pulse 77 112    Respirations 14 14    O2 Sat 97 97    S/S 6 6    Pain Level 4      D/C home with [name]-daughter, patient A & O X 3, D/C instructions reviewed, and sits independently.     Unable to do procedure.

## 2012-09-15 NOTE — Progress Notes (Signed)
Attempted right L3 L4-L5 radiofrequency,Severely scoliotic, severe spondylosis, poor landmarks. During sensory stimulation could not identify nerve. Aborted procedure

## 2012-09-16 ENCOUNTER — Telehealth: Payer: Self-pay

## 2012-09-16 NOTE — Telephone Encounter (Signed)
Needs hydrocodone called in.  Was here yesterday and was told by Dr Wynn Banker this had been done.  Hydrocodone called into [facility] pharmacy per patient request.

## 2012-10-17 ENCOUNTER — Encounter: Payer: Medicare Other | Attending: Physical Medicine & Rehabilitation

## 2012-10-17 ENCOUNTER — Encounter: Payer: Self-pay | Admitting: Physical Medicine & Rehabilitation

## 2012-10-17 ENCOUNTER — Ambulatory Visit (HOSPITAL_BASED_OUTPATIENT_CLINIC_OR_DEPARTMENT_OTHER): Payer: Medicare Other | Admitting: Physical Medicine & Rehabilitation

## 2012-10-17 VITALS — BP 159/68 | HR 96 | Resp 14 | Ht 62.0 in | Wt 199.0 lb

## 2012-10-17 DIAGNOSIS — M549 Dorsalgia, unspecified: Secondary | ICD-10-CM | POA: Insufficient documentation

## 2012-10-17 DIAGNOSIS — M47817 Spondylosis without myelopathy or radiculopathy, lumbosacral region: Secondary | ICD-10-CM

## 2012-10-17 DIAGNOSIS — M47816 Spondylosis without myelopathy or radiculopathy, lumbar region: Secondary | ICD-10-CM

## 2012-10-17 DIAGNOSIS — R269 Unspecified abnormalities of gait and mobility: Secondary | ICD-10-CM | POA: Insufficient documentation

## 2012-10-17 MED ORDER — TRAMADOL HCL 50 MG PO TABS: 100.0000 mg | ORAL_TABLET | Freq: Three times a day (TID) | ORAL | Status: DC | PRN

## 2012-10-17 MED ORDER — HYDROCODONE-ACETAMINOPHEN 7.5-325 MG PO TABS: 1.0000 | ORAL_TABLET | Freq: Three times a day (TID) | ORAL | Status: DC | PRN

## 2012-10-17 NOTE — Patient Instructions (Addendum)
The repeat medial branch block will be in one to 2 weeks Continue current medicines except increase hydrocodone to 3 times per day Avoid nonsteroidal anti-inflammatory medications

## 2012-10-17 NOTE — Progress Notes (Signed)
Subjective:    Patient ID: Kari Martinez, female    DOB: 01/27/1924, 79 y.o.   MRN: [id number]  HPI Right-sided back pain is only partially relieved by medications. She sometimes takes 3 hydrocodone as per day. She has been supplementing with nonsteroidal anti-inflammatories even though she's been told not to take them because of past problems with kidneys No falls no new medical problems. Pain Inventory Average Pain 6 Pain Right Now 4 My pain is intermittent, sharp and stabbing  In the last 24 hours, has pain interfered with the following? General activity 7 Relation with others 7 Enjoyment of life 8 What TIME of day is your pain at its worst? morning and daytime Sleep (in general) Fair  Pain is worse with: walking and standing Pain improves with: rest, medication and injections Relief from Meds: 6  Mobility walk without assistance how many minutes can you walk? 5-10 ability to climb steps?  yes do you drive?  yes  Function retired I need assistance with the following:  household duties  Neuro/Psych trouble walking  Prior Studies Any changes since last visit?  no  Physicians involved in your care Any changes since last visit?  no   Family History  Problem Relation Age of Onset  . Heart disease Father    History   Social History  . Marital Status: Widowed    Spouse Name: N/A    Number of Children: N/A  . Years of Education: N/A   Social History Main Topics  . Smoking status: Never Smoker   . Smokeless tobacco: Never Used  . Alcohol Use: No  . Drug Use: No  . Sexually Active: None   Other Topics Concern  . None   Social History Narrative  . None   Past Surgical History  Procedure Date  . Breast lumpectomy   . Abdominal hysterectomy   . Thyroid surgery   . Knee surgery   . Cholecystectomy   . Tonsillectomy   . Carpal tunnel release   . Lump in breast   . Gallbladder surgery   . Meniscus repair    Past Medical History  Diagnosis Date  .  Hypertension   . Arthritis   . Spinal stenosis   . Left humeral fracture Category 01/23/2012   BP 159/68  Pulse 96  Resp 14  Ht 5\' 2"  (1.575 m)  Wt 199 lb (90.266 kg)  BMI 36.40 kg/m2  SpO2 92%    Review of Systems  Respiratory: Positive for shortness of breath.   Musculoskeletal: Positive for gait problem.  All other systems reviewed and are negative.       Objective:   Physical Exam  Nursing note and vitals reviewed. Constitutional: She is oriented to person, place, and time. She appears well-developed and well-nourished.  HENT:  Head: Normocephalic and atraumatic.  Eyes: Conjunctivae normal are normal. Pupils are equal, round, and reactive to light.  Musculoskeletal:       Right hip: Normal.       Left hip: Normal.  Neurological: She is alert and oriented to person, place, and time. She has normal strength. No sensory deficit. She exhibits normal muscle tone. Gait abnormal.  Reflex Scores:      Patellar reflexes are 1+ on the right side and 1+ on the left side.      Achilles reflexes are 1+ on the left side.      Forward flexed gait  Psychiatric: She has a normal mood and affect. Her behavior is normal.  Judgment and thought content normal.          Assessment & Plan:  1. Lumbar spondylosis along with lumbar degenerative scoliosis. Unable to perform right-sided radiofrequency secondary to severe spine deformities. Left side did improve after left-sided radiofrequencies L3-L4 medial branch L5 dorsal ramus. Will repeat medial branch blocks on the right side. Increase hydrocodone to 3 times a day,No nonsteroidal anti-inflammatories

## 2012-10-27 ENCOUNTER — Ambulatory Visit (HOSPITAL_BASED_OUTPATIENT_CLINIC_OR_DEPARTMENT_OTHER): Payer: Medicare Other | Admitting: Physical Medicine & Rehabilitation

## 2012-10-27 ENCOUNTER — Encounter: Payer: Self-pay | Admitting: Physical Medicine & Rehabilitation

## 2012-10-27 VITALS — BP 146/62 | HR 92 | Resp 14 | Ht 62.0 in | Wt 199.0 lb

## 2012-10-27 DIAGNOSIS — M47816 Spondylosis without myelopathy or radiculopathy, lumbar region: Secondary | ICD-10-CM

## 2012-10-27 DIAGNOSIS — M47817 Spondylosis without myelopathy or radiculopathy, lumbosacral region: Secondary | ICD-10-CM

## 2012-10-27 NOTE — Progress Notes (Signed)
Right lumbar L3, L4 medial branch blocks and L5 dorsal ramus injection under fluoroscopic guidance  Indication: Right Lumbar pain which is not relieved by medication management or other conservative care and interfering with self-care and mobility.  Informed consent was obtained after describing risks and benefits of the procedure with the patient, this includes bleeding, bruising, infection, paralysis and medication side effects. The patient wishes to proceed and has given written consent. The patient was placed in a prone position. The lumbar area was marked and prepped with Betadine. One ML of 1% lidocaine was injected into each of 3 areas into the skin and subcutaneous tissue. Then a 22-gauge 3.5 inch spinal needle was inserted targeting the junction of the Right S1 superior articular process and sacral ala junction. Needle was advanced under fluoroscopic guidance. Bone contact was made. Omnipaque 180 was injected x0.5 mL demonstrating no intravascular uptake. Then a solution containing one ML of 4 mg per mL dexamethasone and 3 mL of 2% MPF lidocaine was injected x0.5 mL. Then the Right L5 superior articular process in transverse process junction was targeted. Bone contact was made. Omnipaque 180 was injected x0.5 mL demonstrating no intravascular uptake. Then a solution containing one ML of 4 mg per mL dexamethasone and 3 mL of 2% MPF lidocaine was injected x0.5 mL. Then the Right L4 superior articular process in transverse process junction was targeted. Bone contact was made. Omnipaque 180 was injected x0.5 mL demonstrating no intravascular uptake. Then a solution containing one ML of 4 mg per mL dexamethasone and 3 mL of 2% MPF lidocaine was injected x0.5 mL Patient tolerated procedure well. Post procedure instructions were given. Please refer to post procedure form. 

## 2012-10-27 NOTE — Patient Instructions (Signed)
I'll see back in 3 months You have refills on your tramadol Call your pharmacy if you need to get additional hydrocodone

## 2012-10-27 NOTE — Progress Notes (Signed)
  PROCEDURE RECORD [facility]   Name: Kari Martinez DOB:1924-08-29 MRN: [id number]  Date:10/27/2012  Physician: Claudette Laws, MD    Nurse/CMA: Kelli Churn, CMA/ Shumaker, RN  Allergies:  Allergies  Allergen Reactions  . Alendronate Sodium Other (See Comments)    Throat swelling  . Ketorolac Other (See Comments)    "Retained fluid in lungs"  . Ketorolac Tromethamine Other (See Comments)    FLUID RETENTION  . Nsaids Other (See Comments)    Elevated kidney function  . Risedronate Sodium Other (See Comments)    Throat swelling and mouth swelling  . Vioxx (Rofecoxib)   . Allopurinol Rash  . Sulfa Antibiotics Rash    Consent Signed: yes  Is patient diabetic? no    Pregnant: no LMP: No LMP recorded. Patient is postmenopausal. (age 88-55)  Anticoagulants: no Anti-inflammatory: no Antibiotics: no  Procedure: Medial branch block right  Position: Prone Start Time:  12:37 End Time:  12:43 Fluoro Time: 17 seconds  RN/CMA Mao Lockner, CMA Shumaker, RN    Time 1200 12:47    BP 146/62 155?58    Pulse 95 93    Respirations 14 14    O2 Sat 96 97    S/S 6 6    Pain Level 5/10   5/10     D/C home with [name]-daughter, patient A & O X 3, D/C instructions reviewed, and sits independently.

## 2012-11-14 ENCOUNTER — Other Ambulatory Visit: Payer: Self-pay | Admitting: Physical Medicine & Rehabilitation

## 2012-11-14 MED ORDER — HYDROCODONE-ACETAMINOPHEN 7.5-325 MG PO TABS: ORAL_TABLET | ORAL | Status: DC

## 2012-11-18 ENCOUNTER — Encounter: Payer: Self-pay | Admitting: Physical Medicine & Rehabilitation

## 2012-11-19 ENCOUNTER — Other Ambulatory Visit: Payer: Self-pay

## 2012-11-21 ENCOUNTER — Telehealth: Payer: Self-pay

## 2012-11-21 NOTE — Telephone Encounter (Signed)
This was an my chart message .Marland KitchenMarland KitchenI received an e-mail saying I was a "no show" for my 10/17/2012 appointment. That is an error. I did fill that appointment - an office visit with Dr. Wynn Banker. I have received a bill for that visit, and it is wrong. It bills me for pharmacy, operating room services and treatment room. It was only an office visit. Please correct your records. Kari Martinez.  Please advise.

## 2012-11-21 NOTE — Telephone Encounter (Signed)
Spoke with patient regarding no-show, and appointment has been corrected.  Also discussed questions about her bill:  Facility/hospital charges are billed in 30 day cycles, and statement reflects two dates of service - 1/13 and 1/23.

## 2012-12-12 ENCOUNTER — Other Ambulatory Visit: Payer: Self-pay | Admitting: Physical Medicine & Rehabilitation

## 2013-01-11 ENCOUNTER — Other Ambulatory Visit: Payer: Self-pay | Admitting: Physical Medicine & Rehabilitation

## 2013-01-23 ENCOUNTER — Encounter: Payer: Self-pay | Admitting: Physical Medicine & Rehabilitation

## 2013-01-23 ENCOUNTER — Ambulatory Visit (HOSPITAL_BASED_OUTPATIENT_CLINIC_OR_DEPARTMENT_OTHER): Payer: Medicare Other | Admitting: Physical Medicine & Rehabilitation

## 2013-01-23 ENCOUNTER — Encounter: Payer: Medicare Other | Attending: Physical Medicine & Rehabilitation

## 2013-01-23 VITALS — BP 152/74 | HR 102 | Resp 16 | Ht 62.0 in | Wt 204.0 lb

## 2013-01-23 DIAGNOSIS — M545 Low back pain, unspecified: Secondary | ICD-10-CM | POA: Insufficient documentation

## 2013-01-23 DIAGNOSIS — M47816 Spondylosis without myelopathy or radiculopathy, lumbar region: Secondary | ICD-10-CM

## 2013-01-23 DIAGNOSIS — M48061 Spinal stenosis, lumbar region without neurogenic claudication: Secondary | ICD-10-CM | POA: Insufficient documentation

## 2013-01-23 DIAGNOSIS — Z5181 Encounter for therapeutic drug level monitoring: Secondary | ICD-10-CM

## 2013-01-23 DIAGNOSIS — M5416 Radiculopathy, lumbar region: Secondary | ICD-10-CM

## 2013-01-23 DIAGNOSIS — IMO0002 Reserved for concepts with insufficient information to code with codable children: Secondary | ICD-10-CM

## 2013-01-23 DIAGNOSIS — I10 Essential (primary) hypertension: Secondary | ICD-10-CM | POA: Insufficient documentation

## 2013-01-23 DIAGNOSIS — M549 Dorsalgia, unspecified: Secondary | ICD-10-CM | POA: Insufficient documentation

## 2013-01-23 DIAGNOSIS — M533 Sacrococcygeal disorders, not elsewhere classified: Secondary | ICD-10-CM

## 2013-01-23 DIAGNOSIS — M47817 Spondylosis without myelopathy or radiculopathy, lumbosacral region: Secondary | ICD-10-CM

## 2013-01-23 DIAGNOSIS — Z79899 Other long term (current) drug therapy: Secondary | ICD-10-CM

## 2013-01-23 MED ORDER — TRAMADOL HCL 50 MG PO TABS: 100.0000 mg | ORAL_TABLET | Freq: Three times a day (TID) | ORAL | Status: DC | PRN

## 2013-01-23 NOTE — Patient Instructions (Signed)
Next injection will be for sacroiliac joint  We may need to followup for some therapy as well.  If the right thigh pain becomes more severe we could do a right L3 epidural

## 2013-01-23 NOTE — Progress Notes (Signed)
Subjective:    Patient ID: Kari Martinez, female    DOB: December 19, 1923, 79 y.o.   MRN: [id number]  HPI Right-sided back pain is only partially relieved by medications. She sometimes takes 3 hydrocodone as per day. She has been supplementing with nonsteroidal anti-inflammatories even though she's been told not to take them because of past problems with kidneys No falls no new medical problems.  Right lumbar pain with improvement after medial branch L3-4-5. Has noticed pain is more in buttock. As well as right groin area Has some tingling in the right anterior thigh area. Pain Inventory Average Pain 7 Pain Right Now 5 My pain is intermittent, sharp and stabbing  In the last 24 hours, has pain interfered with the following? General activity 6 Relation with others 6 Enjoyment of life 8 What TIME of day is your pain at its worst? morning Sleep (in general) Fair  Pain is worse with: walking and standing Pain improves with: rest and medication Relief from Meds: 6  Mobility walk without assistance how many minutes can you walk? 10-15 ability to climb steps?  yes do you drive?  yes  Function retired I need assistance with the following:  household duties  Neuro/Psych trouble walking  Prior Studies Any changes since last visit?  no  Physicians involved in your care Any changes since last visit?  no   Family History  Problem Relation Age of Onset  . Heart disease Father    History   Social History  . Marital Status: Widowed    Spouse Name: N/A    Number of Children: N/A  . Years of Education: N/A   Social History Main Topics  . Smoking status: Never Smoker   . Smokeless tobacco: Never Used  . Alcohol Use: No  . Drug Use: No  . Sexually Active: None   Other Topics Concern  . None   Social History Narrative  . None   Past Surgical History  Procedure Laterality Date  . Breast lumpectomy    . Abdominal hysterectomy    . Thyroid surgery    . Knee surgery    .  Cholecystectomy    . Tonsillectomy    . Carpal tunnel release    . Lump in breast    . Gallbladder surgery    . Meniscus repair     Past Medical History  Diagnosis Date  . Hypertension   . Arthritis   . Spinal stenosis   . Left humeral fracture Category 01/23/2012   BP 152/74  Pulse 102  Resp 16  Ht 5\' 2"  (1.575 m)  Wt 204 lb (92.534 kg)  BMI 37.3 kg/m2  SpO2 99%     Review of Systems  Musculoskeletal: Positive for back pain and gait problem.  All other systems reviewed and are negative.       Objective:   Physical Exam  Positive tenderness right PSIS as well as the right these medius muscle Positive femoral stretch test on the right side Sensation intact to light touch in both eyes. Hip range of motion is normal bilaterally. Negative straight leg raising test. Motor strength normal in the lower limbs Deep tendon reflexes reduced right knee      Assessment & Plan:  1. Lumbar spondylosis improved after medial branch blocks 10/27/2038 2. Sacroiliac discomfort positive PSIS tenderness. This is her main complaint will inject the right sacroiliac joint under fluoroscopic guidance #3. Right L3 radiculitis. She has evidence of lumbar spinal stenosis L3-4 and L4-5 levels  on prior MRI from 2009. At this point this is not a primary complaint but if it becomes more problematic may benefit from right L3 transforaminal epidural steroid injection

## 2013-02-14 ENCOUNTER — Encounter: Payer: Self-pay | Admitting: Physical Medicine & Rehabilitation

## 2013-02-14 ENCOUNTER — Encounter: Payer: Medicare Other | Attending: Physical Medicine & Rehabilitation

## 2013-02-14 ENCOUNTER — Ambulatory Visit (HOSPITAL_BASED_OUTPATIENT_CLINIC_OR_DEPARTMENT_OTHER): Payer: Medicare Other | Admitting: Physical Medicine & Rehabilitation

## 2013-02-14 VITALS — BP 135/56 | HR 57 | Resp 14 | Ht 62.0 in | Wt 206.0 lb

## 2013-02-14 DIAGNOSIS — M47817 Spondylosis without myelopathy or radiculopathy, lumbosacral region: Secondary | ICD-10-CM | POA: Insufficient documentation

## 2013-02-14 DIAGNOSIS — I10 Essential (primary) hypertension: Secondary | ICD-10-CM | POA: Insufficient documentation

## 2013-02-14 DIAGNOSIS — M545 Low back pain, unspecified: Secondary | ICD-10-CM | POA: Insufficient documentation

## 2013-02-14 DIAGNOSIS — M533 Sacrococcygeal disorders, not elsewhere classified: Secondary | ICD-10-CM

## 2013-02-14 DIAGNOSIS — IMO0002 Reserved for concepts with insufficient information to code with codable children: Secondary | ICD-10-CM | POA: Insufficient documentation

## 2013-02-14 MED ORDER — HYDROCODONE-ACETAMINOPHEN 7.5-325 MG PO TABS: ORAL_TABLET | ORAL | Status: DC

## 2013-02-14 NOTE — Patient Instructions (Addendum)
We injected the right sacroiliac joint today. We will have a followup appointment in 6 wks to discuss how it did for you. My hope is that your buttocks pain and low back pain on the right side is relieved by 50%. Continue

## 2013-02-14 NOTE — Progress Notes (Signed)
  PROCEDURE RECORD [facility]   Name: Kari Martinez DOB:[birth year] MRN: [id number]  Date:02/14/2013  Physician: Claudette Laws, MD    Nurse/CMA: Kelli Churn, CMA/Shumaker, RN  Allergies:  Allergies  Allergen Reactions  . Alendronate Sodium Other (See Comments)    Throat swelling  . Ketorolac Other (See Comments)    "Retained fluid in lungs"  . Ketorolac Tromethamine Other (See Comments)    FLUID RETENTION  . Nsaids Other (See Comments)    Elevated kidney function  . Risedronate Sodium Other (See Comments)    Throat swelling and mouth swelling  . Vioxx (Rofecoxib)   . Allopurinol Rash  . Sulfa Antibiotics Rash    Consent Signed: yes  Is patient diabetic? no    Pregnant: no LMP: No LMP recorded. Patient is postmenopausal. (age 46-55)  Anticoagulants: no Anti-inflammatory: no Antibiotics: no  Procedure: sacroiliac injection  Position: Prone Start Time:  205 End Time:  211 Fluoro Time: 8  RN/CMA Levens, CMA Levens, CMA    Time 140 214    BP 135/56 145/49    Pulse 57 82    Respirations 14 14    O2 Sat 95 96    S/S 6 6    Pain Level 6 4     D/C home with [name]-daughter, patient A & O X 3, D/C instructions reviewed, and sits independently.

## 2013-02-14 NOTE — Progress Notes (Signed)
Right sacroiliac injection under fluoroscopic guidance  Indication: Right Low back and buttocks pain not relieved by medication management and other conservative care.  Informed consent was obtained after describing risks and benefits of the procedure with the patient, this includes bleeding, bruising, infection, paralysis and medication side effects. The patient wishes to proceed and has given written consent. The patient was placed in a prone position. The lumbar and sacral area was marked and prepped with Betadine. A 25-gauge 1-1/2 inch needle was inserted into the skin and subcutaneous tissue and 1 mL of 1% lidocaine was injected. Then a 25-gauge 3 inch spinal needle was inserted under fluoroscopic guidance into the Right sacroiliac joint. AP and lateral images were utilized. Omnipaque 180x0.5 mL under live fluoroscopy demonstrated no intravascular uptake. Then a solution containing one ML of 40 mg per mL depomedrol and 2 ML of 1% lidocaine MPF was injected x1.5 mL. Patient tolerated the procedure well. Post procedure instructions were given. Please see post procedure form. 

## 2013-02-15 ENCOUNTER — Telehealth: Payer: Self-pay

## 2013-02-15 NOTE — Telephone Encounter (Signed)
Hydrocodone called in 

## 2013-03-15 ENCOUNTER — Other Ambulatory Visit: Payer: Self-pay

## 2013-03-15 MED ORDER — HYDROCODONE-ACETAMINOPHEN 7.5-325 MG PO TABS: ORAL_TABLET | ORAL | Status: DC

## 2013-03-15 NOTE — Telephone Encounter (Signed)
Patient called requesting hydrocodone refill.  This was called into [facility].  Patient informed.

## 2013-03-28 ENCOUNTER — Encounter: Payer: Medicare Other | Attending: Physical Medicine & Rehabilitation

## 2013-03-28 ENCOUNTER — Encounter: Payer: Self-pay | Admitting: Physical Medicine & Rehabilitation

## 2013-03-28 ENCOUNTER — Ambulatory Visit (HOSPITAL_BASED_OUTPATIENT_CLINIC_OR_DEPARTMENT_OTHER): Payer: Medicare Other | Admitting: Physical Medicine & Rehabilitation

## 2013-03-28 VITALS — BP 122/60 | HR 101 | Resp 16 | Ht 62.0 in | Wt 206.0 lb

## 2013-03-28 DIAGNOSIS — M48061 Spinal stenosis, lumbar region without neurogenic claudication: Secondary | ICD-10-CM

## 2013-03-28 DIAGNOSIS — I10 Essential (primary) hypertension: Secondary | ICD-10-CM | POA: Insufficient documentation

## 2013-03-28 DIAGNOSIS — M419 Scoliosis, unspecified: Secondary | ICD-10-CM

## 2013-03-28 DIAGNOSIS — M4120 Other idiopathic scoliosis, site unspecified: Secondary | ICD-10-CM

## 2013-03-28 DIAGNOSIS — M545 Low back pain, unspecified: Secondary | ICD-10-CM | POA: Insufficient documentation

## 2013-03-28 DIAGNOSIS — M47816 Spondylosis without myelopathy or radiculopathy, lumbar region: Secondary | ICD-10-CM

## 2013-03-28 DIAGNOSIS — IMO0002 Reserved for concepts with insufficient information to code with codable children: Secondary | ICD-10-CM | POA: Insufficient documentation

## 2013-03-28 DIAGNOSIS — M47817 Spondylosis without myelopathy or radiculopathy, lumbosacral region: Secondary | ICD-10-CM | POA: Insufficient documentation

## 2013-03-28 DIAGNOSIS — M5416 Radiculopathy, lumbar region: Secondary | ICD-10-CM

## 2013-03-28 MED ORDER — HYDROCODONE-ACETAMINOPHEN 10-325 MG PO TABS: 1.0000 | ORAL_TABLET | Freq: Three times a day (TID) | ORAL | Status: DC | PRN

## 2013-03-28 NOTE — Progress Notes (Signed)
Subjective:    Patient ID: Kari Martinez, female    DOB: 06-26-1924, 79 y.o.   MRN: [id number]  HPI R SI injection helped for about a week Did not help R Groin pain Taking hydrocodone 3 tablets of 7.5 mg per day, tramadol 50 mg 3 times per day, also supplements with ibuprofen 3 or 4 days per week. She has been told not to take ibuprofen because of kidney problems. She is also on chronic 5 mg prednisone Pain Inventory Average Pain 8 Pain Right Now 8 My pain is constant and sharp  In the last 24 hours, has pain interfered with the following? General activity 7 Relation with others 7 Enjoyment of life 10 What TIME of day is your pain at its worst? morning,day time Sleep (in general) Fair  Pain is worse with: walking, standing and some activites Pain improves with: medication Relief from Meds: na  Mobility walk without assistance walk with assistance use a walker how many minutes can you walk? 5-10 ability to climb steps?  yes do you drive?  yes  Function I need assistance with the following:  household duties and shopping  Neuro/Psych trouble walking  Prior Studies Any changes since last visit?  no  Physicians involved in your care Any changes since last visit?  no   Family History  Problem Relation Age of Onset  . Heart disease Father    History   Social History  . Marital Status: Widowed    Spouse Name: N/A    Number of Children: N/A  . Years of Education: N/A   Social History Main Topics  . Smoking status: Never Smoker   . Smokeless tobacco: Never Used  . Alcohol Use: No  . Drug Use: No  . Sexually Active: None   Other Topics Concern  . None   Social History Narrative  . None   Past Surgical History  Procedure Laterality Date  . Breast lumpectomy    . Abdominal hysterectomy    . Thyroid surgery    . Knee surgery    . Cholecystectomy    . Tonsillectomy    . Carpal tunnel release    . Lump in breast    . Gallbladder surgery    . Meniscus  repair     Past Medical History  Diagnosis Date  . Hypertension   . Arthritis   . Spinal stenosis   . Left humeral fracture Category 01/23/2012   BP 122/60  Pulse 101  Resp 16  Ht 5\' 2"  (1.575 m)  Wt 206 lb (93.441 kg)  BMI 37.67 kg/m2  SpO2 95%     Review of Systems  Respiratory: Positive for shortness of breath.   Musculoskeletal: Positive for gait problem.  All other systems reviewed and are negative.       Objective:   Physical Exam  Levoconvex scoliosis centered at L4. Limited spine range of motion 0-25% lateral bending 0-50% flexion 0% extension Lower extremities normal strength. Hip on the right has normal range of motion no pain with range of motion Hip on the left has reduced internal rotation but no pain with range of motion      Assessment & Plan:  1. Lumbar scoliosis as well as spondylosis no clear-cut evidence of neurogenic claudication. No red flags. We discussed that injections are likely not to be of much help because of multifactorial pain. SI injection may be predictive of sacroiliac RF procedure success. Will hold off on this unless we do not  get any further relief with medication management or she is unable to tolerate the side effects of medications.  Thus far she has tolerated medications without evidence of lethargy or mental status changes. Her daughter confirms this. No falls at home. No signs of aberrant drug behavior. Increase hydrocodone to 10/325 3 times a day Continue tramadol 50 3 times a day I recommend limiting ibuprofen to a maximum of 2 days per week  Discussed with patient and her daughter agree with plan. Followup in 3 months or sooner if no improvement

## 2013-03-28 NOTE — Patient Instructions (Addendum)
If the new medication regimen is not helping please call for another appointment  May take tramadol 3 times a day with the hydrocodone  Avoid taking ibuprofen more than 1 or 2 days per week

## 2013-04-24 ENCOUNTER — Ambulatory Visit (HOSPITAL_BASED_OUTPATIENT_CLINIC_OR_DEPARTMENT_OTHER): Payer: Self-pay | Admitting: Physical Medicine & Rehabilitation

## 2013-04-24 ENCOUNTER — Encounter: Payer: Medicare Other | Attending: Physical Medicine & Rehabilitation

## 2013-04-24 ENCOUNTER — Encounter: Payer: Self-pay | Admitting: Physical Medicine & Rehabilitation

## 2013-04-24 VITALS — BP 156/69 | HR 113 | Resp 16 | Ht 62.0 in | Wt 205.0 lb

## 2013-04-24 DIAGNOSIS — M545 Low back pain, unspecified: Secondary | ICD-10-CM

## 2013-04-24 DIAGNOSIS — M47816 Spondylosis without myelopathy or radiculopathy, lumbar region: Secondary | ICD-10-CM

## 2013-04-24 DIAGNOSIS — R52 Pain, unspecified: Secondary | ICD-10-CM

## 2013-04-24 DIAGNOSIS — M47817 Spondylosis without myelopathy or radiculopathy, lumbosacral region: Secondary | ICD-10-CM | POA: Insufficient documentation

## 2013-04-24 DIAGNOSIS — M48061 Spinal stenosis, lumbar region without neurogenic claudication: Secondary | ICD-10-CM

## 2013-04-24 DIAGNOSIS — IMO0002 Reserved for concepts with insufficient information to code with codable children: Secondary | ICD-10-CM | POA: Insufficient documentation

## 2013-04-24 DIAGNOSIS — I10 Essential (primary) hypertension: Secondary | ICD-10-CM | POA: Insufficient documentation

## 2013-04-24 DIAGNOSIS — M419 Scoliosis, unspecified: Secondary | ICD-10-CM

## 2013-04-24 DIAGNOSIS — M4120 Other idiopathic scoliosis, site unspecified: Secondary | ICD-10-CM

## 2013-04-24 DIAGNOSIS — G8929 Other chronic pain: Secondary | ICD-10-CM

## 2013-04-24 MED ORDER — OXYCODONE HCL 10 MG PO TABS: 10.0000 mg | ORAL_TABLET | Freq: Three times a day (TID) | ORAL | Status: DC

## 2013-04-24 NOTE — Patient Instructions (Signed)
Please stop the hydrocodone once you start the Oxycodone

## 2013-04-24 NOTE — Progress Notes (Signed)
Subjective:    Patient ID: Kari Martinez, female    DOB: 1923-11-14, 79 y.o.   MRN: [id number]  HPI R SI injection helped for about a week  Did not help R Groin pain Patient doesn't feel that the increased dose of hydrocodone was helpful for her pain, she requests another medication No sedation. Patient has gained weight Pain Inventory Average Pain 8 Pain Right Now 9 My pain is constant  In the last 24 hours, has pain interfered with the following? General activity 8 Relation with others 8 Enjoyment of life 10 What TIME of day is your pain at its worst? daytime Sleep (in general) Fair  Pain is worse with: walking and standing Pain improves with: medication Relief from Meds: 5  Mobility walk without assistance how many minutes can you walk? 5-10 ability to climb steps?  yes do you drive?  yes  Function I need assistance with the following:  household duties  Neuro/Psych trouble walking  Prior Studies Any changes since last visit?  no  Physicians involved in your care Any changes since last visit?  no   Family History  Problem Relation Age of Onset  . Heart disease Father    History   Social History  . Marital Status: Widowed    Spouse Name: N/A    Number of Children: N/A  . Years of Education: N/A   Social History Main Topics  . Smoking status: Never Smoker   . Smokeless tobacco: Never Used  . Alcohol Use: No  . Drug Use: No  . Sexually Active: None   Other Topics Concern  . None   Social History Narrative  . None   Past Surgical History  Procedure Laterality Date  . Breast lumpectomy    . Abdominal hysterectomy    . Thyroid surgery    . Knee surgery    . Cholecystectomy    . Tonsillectomy    . Carpal tunnel release    . Lump in breast    . Gallbladder surgery    . Meniscus repair     Past Medical History  Diagnosis Date  . Hypertension   . Arthritis   . Spinal stenosis   . Left humeral fracture Category 01/23/2012   BP 156/69   Pulse 113  Resp 16  Ht 5\' 2"  (1.575 m)  Wt 205 lb (92.987 kg)  BMI 37.49 kg/m2  SpO2 95%     Review of Systems  Respiratory: Positive for shortness of breath.   Musculoskeletal: Positive for back pain and gait problem.  All other systems reviewed and are negative.       Objective:   Physical Exam  Neurological: She has normal strength.  Bilateral hip flexors knee extensors ankle dorsiflexors 5/5    Levoconvex scoliosis centered at L4.  Limited spine range of motion 0-25% lateral bending 0-50% flexion 0% extension  Lower extremities normal strength.  Hip on the right has normal range of motion no pain with range of motion  Hip on the left has reduced internal rotation but no pain       Assessment & Plan:  1. Lumbar scoliosis as well as spondylosis no clear-cut evidence of neurogenic claudication. No red flags.  We discussed that injections are likely not to be of much help because of multifactorial pain.  SI injection may be predictive of sacroiliac RF procedure success. Will hold off on this unless we do not get any further relief with medication management or she is unable  to tolerate the side effects of medications.  Thus far she has tolerated medications without evidence of lethargy or mental status changes. Her daughter confirms this. No falls at home. No signs of aberrant drug behavior.  Oxycodone 10 3 times a day  Continue tramadol 50 3 times a day  I recommend limiting ibuprofen to a maximum of 2 days per week  Discussed with patient and her daughter agree with plan. Followup in 1 months   If medication change does not result in proved pain relief, will repeat lumbar imaging. History of osteoporosis would be at risk for compression fracture

## 2013-05-02 ENCOUNTER — Telehealth: Payer: Self-pay | Admitting: Physical Medicine & Rehabilitation

## 2013-05-02 MED ORDER — TRAMADOL HCL 50 MG PO TABS: 100.0000 mg | ORAL_TABLET | Freq: Three times a day (TID) | ORAL | Status: DC | PRN

## 2013-05-02 NOTE — Telephone Encounter (Signed)
Called to pharmacy [place] notified.

## 2013-05-02 NOTE — Telephone Encounter (Signed)
Pt called in at 1003 am.. She states she was to pick up her traMADol (ULTRAM) 50 MG tablet On today and the pharmacy has not heard anything from our office.

## 2013-05-10 ENCOUNTER — Other Ambulatory Visit: Payer: Self-pay

## 2013-05-23 ENCOUNTER — Encounter: Payer: Medicare Other | Attending: Physical Medicine & Rehabilitation

## 2013-05-23 ENCOUNTER — Ambulatory Visit (HOSPITAL_BASED_OUTPATIENT_CLINIC_OR_DEPARTMENT_OTHER): Payer: Self-pay | Admitting: Physical Medicine & Rehabilitation

## 2013-05-23 ENCOUNTER — Encounter: Payer: Self-pay | Admitting: Physical Medicine & Rehabilitation

## 2013-05-23 VITALS — HR 107 | Resp 14 | Ht 62.0 in | Wt 200.6 lb

## 2013-05-23 DIAGNOSIS — M4120 Other idiopathic scoliosis, site unspecified: Secondary | ICD-10-CM | POA: Insufficient documentation

## 2013-05-23 DIAGNOSIS — M25559 Pain in unspecified hip: Secondary | ICD-10-CM

## 2013-05-23 DIAGNOSIS — M76899 Other specified enthesopathies of unspecified lower limb, excluding foot: Secondary | ICD-10-CM | POA: Insufficient documentation

## 2013-05-23 DIAGNOSIS — M25552 Pain in left hip: Secondary | ICD-10-CM

## 2013-05-23 DIAGNOSIS — M48062 Spinal stenosis, lumbar region with neurogenic claudication: Secondary | ICD-10-CM

## 2013-05-23 DIAGNOSIS — I10 Essential (primary) hypertension: Secondary | ICD-10-CM | POA: Insufficient documentation

## 2013-05-23 DIAGNOSIS — M48061 Spinal stenosis, lumbar region without neurogenic claudication: Secondary | ICD-10-CM | POA: Insufficient documentation

## 2013-05-23 MED ORDER — OXYCODONE HCL 15 MG PO TABS: 15.0000 mg | ORAL_TABLET | Freq: Three times a day (TID) | ORAL | Status: DC | PRN

## 2013-05-23 MED ORDER — GABAPENTIN 100 MG PO CAPS: 100.0000 mg | ORAL_CAPSULE | Freq: Three times a day (TID) | ORAL | Status: DC

## 2013-05-23 NOTE — Patient Instructions (Addendum)
New medicine gabapentin 3 times a day this is for nerve pain. Also increased oxycodone 15 mg 3 times per day Monitor for sedation

## 2013-05-23 NOTE — Progress Notes (Signed)
Subjective:    Patient ID: Kari Martinez, female    DOB: 02-May-1924, 79 y.o.   MRN: [id number]  HPI Reduced activity level over the last month. Has not been to church. Using a walker to ambulate at home. Complains of bilateral groin pain as well as low back pain as well left knee pain. No falls. No loss of bowel or bladder function. No fevers or chills. No weight loss  Reviewed prior MRIs does have marked lumbar spinal stenosis L4-L5 noted on a MRI scan performed in 2009.   Pain Inventory Average Pain 6 Pain Right Now 6 My pain is sharp, stabbing and tingling  In the last 24 hours, has pain interfered with the following? General activity 9 Relation with others 10 Enjoyment of life 10 What TIME of day is your pain at its worst? daytime, morning, evening Sleep (in general) Fair  Pain is worse with: walking, standing and some activites Pain improves with: medication Relief from Meds: 4  Mobility walk without assistance use a walker how many minutes can you walk? 5 ability to climb steps?  yes do you drive?  yes  Function not employed: date last employed na I need assistance with the following:  household duties and shopping  Neuro/Psych trouble walking  Prior Studies Any changes since last visit?  no  Physicians involved in your care Any changes since last visit?  no   Family History  Problem Relation Age of Onset  . Heart disease Father    History   Social History  . Marital Status: Widowed    Spouse Name: N/A    Number of Children: N/A  . Years of Education: N/A   Social History Main Topics  . Smoking status: Never Smoker   . Smokeless tobacco: Never Used  . Alcohol Use: No  . Drug Use: No  . Sexual Activity: None   Other Topics Concern  . None   Social History Narrative  . None   Past Surgical History  Procedure Laterality Date  . Breast lumpectomy    . Abdominal hysterectomy    . Thyroid surgery    . Knee surgery    . Cholecystectomy     . Tonsillectomy    . Carpal tunnel release    . Lump in breast    . Gallbladder surgery    . Meniscus repair     Past Medical History  Diagnosis Date  . Hypertension   . Arthritis   . Spinal stenosis   . Left humeral fracture Category 01/23/2012   Pulse 107  Resp 14  Ht 5\' 2"  (1.575 m)  Wt 200 lb 9.6 oz (90.992 kg)  BMI 36.68 kg/m2  SpO2 97%     Review of Systems  Respiratory: Positive for shortness of breath.   Musculoskeletal: Positive for gait problem.       Objective:   Physical Exam  Nursing note and vitals reviewed. Constitutional: She is oriented to person, place, and time. She appears well-developed and well-nourished.  Obese  HENT:  Head: Normocephalic and atraumatic.  Eyes: Conjunctivae and EOM are normal. Pupils are equal, round, and reactive to light.  Neck: Normal range of motion.  Musculoskeletal:  Levoconvex scoliosis centered at L4.   Limited spine range of motion 0-25% lateral bending 0-50% flexion 0% extension    Ambulates with stenotic. Gait  Tenderness over the left hip greater trochanter  Neurological: She is alert and oriented to person, place, and time. She has normal strength. No  sensory deficit. Gait abnormal.  Psychiatric: She has a normal mood and affect.          Assessment & Plan:.   1. Lumbar spinal stenosis as well as scoliosis with progressive pain , difficulty ambulating. She may be developing increasing neurogenic claudication. In addition she does have left hip pain which appears to be both intra and extra articular We'll order MRI of the lumbar spine given her loss of function Will also order PA pelvis to assess hip joint Left hip trochanteric bursa injection  Trochanteric bursa injection  without ultrasound guidance  Indication Trochanteric bursitis. Exam has tenderness over the greater trochanter of the hip. Pain has not responded to conservative care such as exercise therapy and oral medications. Pain interferes  with sleep or with mobility Informed consent was obtained after describing risks and benefits of the procedure with the patient these include bleeding bruising and infection. Patient has signed written consent form. Patient placed in a lateral decubitus position with the affected hip superior. Point of maximal pain was palpated marked and prepped with Betadine and entered with a needle to bone contact. Needle slightly withdrawn then 40 mg Depo-Medrol with 4 cc 1% lidocaine were injected. Patient tolerated procedure well. Post procedure instructions given.

## 2013-05-25 ENCOUNTER — Telehealth: Payer: Self-pay | Admitting: *Deleted

## 2013-05-25 NOTE — Telephone Encounter (Signed)
Yes Diazepam 5mg  #1 tab no RF to take prior to MRI

## 2013-05-25 NOTE — Telephone Encounter (Signed)
Kari Martinez has her MRI scheduled for Aug 27.  Do you want to order diazepam for this? Daughter mentioned you would.

## 2013-05-26 ENCOUNTER — Telehealth: Payer: Self-pay

## 2013-05-26 MED ORDER — DIAZEPAM 5 MG PO TABS: 5.0000 mg | ORAL_TABLET | Freq: Once | ORAL | Status: DC

## 2013-05-26 NOTE — Telephone Encounter (Signed)
Diazepam called in at [facility]

## 2013-05-26 NOTE — Telephone Encounter (Signed)
Called in diazepam

## 2013-05-31 ENCOUNTER — Ambulatory Visit (HOSPITAL_COMMUNITY)
Admission: RE | Admit: 2013-05-31 | Discharge: 2013-05-31 | Disposition: A | Discharge status: Home / Self Care | Payer: Medicare Other | Source: Ambulatory Visit | Attending: Physical Medicine & Rehabilitation | Admitting: Physical Medicine & Rehabilitation

## 2013-05-31 DIAGNOSIS — M48061 Spinal stenosis, lumbar region without neurogenic claudication: Secondary | ICD-10-CM | POA: Insufficient documentation

## 2013-05-31 DIAGNOSIS — M4120 Other idiopathic scoliosis, site unspecified: Secondary | ICD-10-CM | POA: Insufficient documentation

## 2013-05-31 DIAGNOSIS — M51379 Other intervertebral disc degeneration, lumbosacral region without mention of lumbar back pain or lower extremity pain: Secondary | ICD-10-CM | POA: Insufficient documentation

## 2013-05-31 DIAGNOSIS — M5137 Other intervertebral disc degeneration, lumbosacral region: Secondary | ICD-10-CM | POA: Insufficient documentation

## 2013-05-31 DIAGNOSIS — R109 Unspecified abdominal pain: Secondary | ICD-10-CM | POA: Insufficient documentation

## 2013-05-31 DIAGNOSIS — M47817 Spondylosis without myelopathy or radiculopathy, lumbosacral region: Secondary | ICD-10-CM | POA: Insufficient documentation

## 2013-05-31 DIAGNOSIS — M48062 Spinal stenosis, lumbar region with neurogenic claudication: Secondary | ICD-10-CM

## 2013-05-31 DIAGNOSIS — D1809 Hemangioma of other sites: Secondary | ICD-10-CM | POA: Insufficient documentation

## 2013-06-02 ENCOUNTER — Telehealth: Payer: Self-pay | Admitting: *Deleted

## 2013-06-02 NOTE — Telephone Encounter (Signed)
Left message to [facility] to give Dr Wynn Banker comment on MRI: Notes Recorded by Erick Colace, MD on 06/02/2013 at 9:26 AM Patient has worsening of compression of the spinal nerve roots. We'll need to discuss at next visit whether we can make referral to surgery versus a trial of epidural steroid injections at L3-4 or L4-5 levels

## 2013-06-02 NOTE — Telephone Encounter (Signed)
Message copied by Doreene Eland on Fri Jun 02, 2013  2:06 PM ------      Message from: Su Monks      Created: Fri Jun 02, 2013 11:06 AM       See Dr. Jodean Lima note below, please let patient know ------

## 2013-06-07 NOTE — Telephone Encounter (Signed)
Spoke with Kari Martinez and relayed Dr Wynn Banker message about her MRI

## 2013-06-12 ENCOUNTER — Ambulatory Visit (HOSPITAL_BASED_OUTPATIENT_CLINIC_OR_DEPARTMENT_OTHER): Payer: Medicare Other | Admitting: Physical Medicine & Rehabilitation

## 2013-06-12 ENCOUNTER — Encounter: Payer: Self-pay | Admitting: Physical Medicine & Rehabilitation

## 2013-06-12 VITALS — HR 92 | Resp 14 | Ht 62.0 in | Wt 200.0 lb

## 2013-06-12 DIAGNOSIS — M25559 Pain in unspecified hip: Secondary | ICD-10-CM

## 2013-06-12 DIAGNOSIS — IMO0002 Reserved for concepts with insufficient information to code with codable children: Secondary | ICD-10-CM | POA: Insufficient documentation

## 2013-06-12 DIAGNOSIS — G8929 Other chronic pain: Secondary | ICD-10-CM

## 2013-06-12 DIAGNOSIS — I10 Essential (primary) hypertension: Secondary | ICD-10-CM | POA: Insufficient documentation

## 2013-06-12 DIAGNOSIS — M47817 Spondylosis without myelopathy or radiculopathy, lumbosacral region: Secondary | ICD-10-CM | POA: Insufficient documentation

## 2013-06-12 DIAGNOSIS — M48061 Spinal stenosis, lumbar region without neurogenic claudication: Secondary | ICD-10-CM

## 2013-06-12 DIAGNOSIS — M545 Low back pain, unspecified: Secondary | ICD-10-CM | POA: Insufficient documentation

## 2013-06-12 MED ORDER — GABAPENTIN 100 MG PO CAPS: 100.0000 mg | ORAL_CAPSULE | Freq: Four times a day (QID) | ORAL | Status: DC

## 2013-06-12 MED ORDER — OXYCODONE HCL 15 MG PO TABS: 15.0000 mg | ORAL_TABLET | Freq: Three times a day (TID) | ORAL | Status: DC | PRN

## 2013-06-12 NOTE — Progress Notes (Signed)
Subjective:    Patient ID: Kari Martinez, female    DOB: 07-13-1924, 79 y.o.   MRN: [id number]  HPI No numbness or tingling C/o of L>R groin pain Independent Pt states she is "getting by ok" Daughter with pt Pain Inventory Average Pain 8 Pain Right Now 9 My pain is intermittent, sharp and stabbing  In the last 24 hours, has pain interfered with the following? General activity 7 Relation with others 6 Enjoyment of life 10 What TIME of day is your pain at its worst? daytime Sleep (in general) Fair  Pain is worse with: walking and standing Pain improves with: rest and medication Relief from Meds: 5  Mobility walk with assistance use a cane use a walker ability to climb steps?  no do you drive?  yes  Function I need assistance with the following:  household duties  Neuro/Psych trouble walking  Prior Studies Any changes since last visit?  no MRI LUMBAR SPINE WITHOUT CONTRAST  Technique: Multiplanar and multiecho pulse sequences of the lumbar  spine were obtained without intravenous contrast.  Comparison: Lumbar MRI 10/25/2007  Findings: Moderate levoscoliosis at L4-5. Negative for fracture or  mass lesion. Conus medullaris is normal and terminates at L1-2.  Small hemangioma T12 is unchanged.  T12-L1: Negative  L1-2: Mild disc degeneration and spurring on the left. Mild facet  degeneration. Moderate left foraminal narrowing.  L2-3: Disc degeneration and spondylosis. Disc degeneration is  most prominent on the left side. There is facet hypertrophy and  mild spinal stenosis due to spurring.  L3-4: Disc degeneration and spondylosis with diffuse endplate  osteophyte formation. There is extensive facet hypertrophy and  moderate to severe spinal stenosis. Marked foraminal encroachment  bilaterally with impingement of the L3 nerve root bilaterally.  Spinal stenosis has progressed since the prior study.  L4-5: Disc degeneration and spondylosis on the right causing right   foraminal encroachment and impingement of the right L4 nerve root.  This is chronic and may have progressed in the interval. There is  moderate spinal stenosis. Bilateral facet hypertrophy is present.  L5-S1: Disc degeneration and spondylosis. Right foraminal  encroachment due to spurring.  IMPRESSION:  Lumbar scoliosis. Multilevel degenerative change.  Moderate to severe spinal stenosis has progressed at L3-4 since the  prior study. There is foraminal encroachment bilaterally with  impingement of the L3 nerve root bilaterally.  Disc degeneration and spondylosis at L4-5 with moderate spinal  stenosis and severe right foraminal encroachment  Disc degeneration and spondylosis L5-S1 with right foraminal  encroachment.  Original Report Authenticated By: Janeece Riggers, M.D.  Physicians involved in your care Any changes since last visit?  no   Family History  Problem Relation Age of Onset  . Heart disease Father    History   Social History  . Marital Status: Widowed    Spouse Name: N/A    Number of Children: N/A  . Years of Education: N/A   Social History Main Topics  . Smoking status: Never Smoker   . Smokeless tobacco: Never Used  . Alcohol Use: No  . Drug Use: No  . Sexual Activity: None   Other Topics Concern  . None   Social History Narrative  . None   Past Surgical History  Procedure Laterality Date  . Breast lumpectomy    . Abdominal hysterectomy    . Thyroid surgery    . Knee surgery    . Cholecystectomy    . Tonsillectomy    . Carpal tunnel release    .  Lump in breast    . Gallbladder surgery    . Meniscus repair     Past Medical History  Diagnosis Date  . Hypertension   . Arthritis   . Spinal stenosis   . Left humeral fracture Category 01/23/2012   Pulse 92  Resp 14  Ht 5\' 2"  (1.575 m)  Wt 200 lb (90.719 kg)  BMI 36.57 kg/m2  SpO2 93%    Review of Systems  Respiratory: Positive for shortness of breath.   Musculoskeletal: Positive for  gait problem.  All other systems reviewed and are negative.       Objective:   Physical Exam  Nursing note and vitals reviewed. Constitutional: She is oriented to person, place, and time. She appears well-developed and well-nourished.  HENT:  Head: Normocephalic and atraumatic.  Eyes: Conjunctivae and EOM are normal. Pupils are equal, round, and reactive to light.  Musculoskeletal:       Right hip: Normal.       Left hip: She exhibits decreased range of motion. She exhibits no tenderness.       Lumbar back: She exhibits decreased range of motion, deformity and pain. She exhibits no tenderness.  Neurological: She is alert and oriented to person, place, and time. She has normal strength and normal reflexes. She displays no atrophy. No sensory deficit. Gait abnormal.  Fwd flexed gait  Psychiatric: She has a normal mood and affect.          Assessment & Plan:  1.  Low back pain and hip pain multifactorial Difficult to determine which are largest contributors to decline in ambulation, no falls , remains independent  Left hip / groin pain worse than right, also with reduced int/ext rotation Will send for xrays, last films 2007 showed L>R hip OA Do intra articular hip injection under fluoro   Spinal stenosis progressed since 2009 gabapentin is helping to a degree , no adverse effects. Will increase to QID Consider ESI although effects are modest  Hold off on surgical consult pending clearer delineation of pain generator and continued conservative care

## 2013-06-12 NOTE — Patient Instructions (Signed)
Increase gabapentin to 4 times per day Continue oxycodone 3 times a day X-rays of hips Hip injection under fluoroscopic guidance next is

## 2013-06-14 ENCOUNTER — Ambulatory Visit (HOSPITAL_COMMUNITY)
Admission: RE | Admit: 2013-06-14 | Discharge: 2013-06-14 | Disposition: A | Discharge status: Home / Self Care | Payer: Medicare Other | Source: Ambulatory Visit | Attending: Physical Medicine & Rehabilitation | Admitting: Physical Medicine & Rehabilitation

## 2013-06-14 ENCOUNTER — Telehealth: Payer: Self-pay

## 2013-06-14 DIAGNOSIS — M1610 Unilateral primary osteoarthritis, unspecified hip: Secondary | ICD-10-CM | POA: Insufficient documentation

## 2013-06-14 DIAGNOSIS — G8929 Other chronic pain: Secondary | ICD-10-CM

## 2013-06-14 DIAGNOSIS — M4120 Other idiopathic scoliosis, site unspecified: Secondary | ICD-10-CM | POA: Insufficient documentation

## 2013-06-14 DIAGNOSIS — M169 Osteoarthritis of hip, unspecified: Secondary | ICD-10-CM | POA: Insufficient documentation

## 2013-06-14 DIAGNOSIS — M5137 Other intervertebral disc degeneration, lumbosacral region: Secondary | ICD-10-CM | POA: Insufficient documentation

## 2013-06-14 DIAGNOSIS — M51379 Other intervertebral disc degeneration, lumbosacral region without mention of lumbar back pain or lower extremity pain: Secondary | ICD-10-CM | POA: Insufficient documentation

## 2013-06-14 NOTE — Telephone Encounter (Signed)
Patient called with questions about her appointments.  She has multiple scheduled and she would like to know if any can be combined.

## 2013-06-21 ENCOUNTER — Encounter: Payer: Medicare Other | Admitting: Physical Medicine and Rehabilitation

## 2013-06-23 ENCOUNTER — Encounter: Payer: Medicare Other | Attending: Physical Medicine & Rehabilitation

## 2013-06-23 ENCOUNTER — Encounter: Payer: Self-pay | Admitting: Physical Medicine & Rehabilitation

## 2013-06-23 ENCOUNTER — Ambulatory Visit (HOSPITAL_BASED_OUTPATIENT_CLINIC_OR_DEPARTMENT_OTHER): Payer: Medicare Other | Admitting: Physical Medicine & Rehabilitation

## 2013-06-23 VITALS — BP 143/54 | HR 109 | Resp 14 | Ht 62.0 in | Wt 200.0 lb

## 2013-06-23 DIAGNOSIS — M1612 Unilateral primary osteoarthritis, left hip: Secondary | ICD-10-CM

## 2013-06-23 DIAGNOSIS — M169 Osteoarthritis of hip, unspecified: Secondary | ICD-10-CM

## 2013-06-23 NOTE — Patient Instructions (Addendum)
Please keep track of pain levels No restrictions today. Injected Celestone and lidocaine

## 2013-06-23 NOTE — Progress Notes (Signed)
  PROCEDURE RECORD [facility]   Name: Kari Martinez DOB:11/08/1923 MRN: [id number]  Date:06/23/2013  Physician: Claudette Laws, MD    Nurse/CMA: Kelli Churn, CMA  Allergies:  Allergies  Allergen Reactions  . Alendronate Sodium Other (See Comments)    Throat swelling  . Ketorolac Other (See Comments)    "Retained fluid in lungs"  . Ketorolac Tromethamine Other (See Comments)    FLUID RETENTION  . Nsaids Other (See Comments)    Elevated kidney function  . Risedronate Sodium Other (See Comments)    Throat swelling and mouth swelling  . Vioxx [Rofecoxib]   . Allopurinol Rash  . Sulfa Antibiotics Rash    Consent Signed: yes  Is patient diabetic? no    Pregnant: no LMP: No LMP recorded. Patient is postmenopausal. (age 47-55)  Anticoagulants: no Anti-inflammatory: yes (prednisone) Antibiotics: no  Procedure: Hip injection  Position: Prone Start Time:  338 End Time: 357  Fluoro Time: 54  RN/CMA Janeese Mcgloin, CMA Harnoor Reta, CMA    Time 329 401    BP 143/54 134/44    Pulse 109 112    Respirations 14 14    O2 Sat 96 74    S/S 6 6    Pain Level 9 6-7     D/C home with Fannie Knee- daughter, patient A & O X 3, D/C instructions reviewed, and sits independently.

## 2013-06-23 NOTE — Progress Notes (Signed)
Intra-articular hip injection Indication left hip osteoarthritis pain is only partially responsive to medication management and other conservative care. Informed consent was obtained after describing risks and benefits of this imitation these include bleeding bruising and infection she elects to proceed and has given written consent. Patient placed in the supine. Fluoroscopic images were taken needle insertion site was marked and prepped with Betadine and alcohol entered with a 25-gauge 1.5 inch needle. 3 cc of 1% lidocaine were infiltrate. A 22-gauge 3.5 inch spinal needle was inserted under fluoroscopic guidance to target the junction of the femoral head and the femoral neck on the left side. Bone contact was made. Needle was suggested. Omnipaque 180 injected demonstrated good intra-articular spread. Then a solution containing 1.5 cc of 6 mg per cc Celestone, 3.5 cc one percent lidocaine. Patient tolerated the procedure well Post procedure instructions given Preinjection pain level 10/10 Post injection pain level 6-7/10

## 2013-07-14 ENCOUNTER — Encounter: Payer: Self-pay | Admitting: Physical Medicine & Rehabilitation

## 2013-07-14 ENCOUNTER — Encounter: Payer: Medicare Other | Attending: Physical Medicine & Rehabilitation

## 2013-07-14 ENCOUNTER — Ambulatory Visit (HOSPITAL_BASED_OUTPATIENT_CLINIC_OR_DEPARTMENT_OTHER): Payer: Medicare Other | Admitting: Physical Medicine & Rehabilitation

## 2013-07-14 VITALS — BP 152/61 | HR 106 | Resp 16 | Ht 62.0 in | Wt 203.0 lb

## 2013-07-14 DIAGNOSIS — M48061 Spinal stenosis, lumbar region without neurogenic claudication: Secondary | ICD-10-CM

## 2013-07-14 DIAGNOSIS — M47817 Spondylosis without myelopathy or radiculopathy, lumbosacral region: Secondary | ICD-10-CM | POA: Insufficient documentation

## 2013-07-14 DIAGNOSIS — M545 Low back pain, unspecified: Secondary | ICD-10-CM | POA: Insufficient documentation

## 2013-07-14 DIAGNOSIS — IMO0002 Reserved for concepts with insufficient information to code with codable children: Secondary | ICD-10-CM

## 2013-07-14 DIAGNOSIS — I10 Essential (primary) hypertension: Secondary | ICD-10-CM | POA: Insufficient documentation

## 2013-07-14 DIAGNOSIS — M1612 Unilateral primary osteoarthritis, left hip: Secondary | ICD-10-CM

## 2013-07-14 DIAGNOSIS — M169 Osteoarthritis of hip, unspecified: Secondary | ICD-10-CM

## 2013-07-14 DIAGNOSIS — M5416 Radiculopathy, lumbar region: Secondary | ICD-10-CM

## 2013-07-14 MED ORDER — GABAPENTIN 100 MG PO CAPS: 200.0000 mg | ORAL_CAPSULE | Freq: Three times a day (TID) | ORAL | Status: DC

## 2013-07-14 MED ORDER — OXYCODONE HCL 15 MG PO TABS: 15.0000 mg | ORAL_TABLET | Freq: Three times a day (TID) | ORAL | Status: DC | PRN

## 2013-07-14 NOTE — Patient Instructions (Signed)
Increase gabapentin to 2 tablets 3 times a day Continue oxycodone 15 mg 3 times a day Consider left L3 nerve root block if pain does not improve in the left thigh  We can potentially repeat the hip injection in about 2 months if needed

## 2013-07-14 NOTE — Progress Notes (Signed)
Subjective:    Patient ID: Kari Martinez, female    DOB: [birth year], 79 y.o.   MRN: [id number]  HPI Hip joint pain is still reduced at a 6-7/10 level. Continues to have back pain as well as left thigh pain. Her legs seems to give way at times but does not fall. She uses a walker to ambulate. She never feels weakness when she is in a sitting position. No knee pain when the buckling episodes occurred. These happen about twice a week She gets partial relief from gabapentin and no adverse effects. Pain Inventory Average Pain 7 Pain Right Now 6 My pain is intermittent and stabbing  In the last 24 hours, has pain interfered with the following? General activity 8 Relation with others 9 Enjoyment of life 10 What TIME of day is your pain at its worst? daytime Sleep (in general) Fair  Pain is worse with: walking and standing Pain improves with: rest and medication Relief from Meds: 6  Mobility use a cane use a walker how many minutes can you walk? 5 ability to climb steps?  yes do you drive?  yes  Function retired I need assistance with the following:  household duties  Neuro/Psych trouble walking  Prior Studies Any changes since last visit?  no  Physicians involved in your care Any changes since last visit?  no   Family History  Problem Relation Age of Onset  . Heart disease Father    History   Social History  . Marital Status: Widowed    Spouse Name: N/A    Number of Children: N/A  . Years of Education: N/A   Social History Main Topics  . Smoking status: Never Smoker   . Smokeless tobacco: Never Used  . Alcohol Use: No  . Drug Use: No  . Sexual Activity: None   Other Topics Concern  . None   Social History Narrative  . None   Past Surgical History  Procedure Laterality Date  . Breast lumpectomy    . Abdominal hysterectomy    . Thyroid surgery    . Knee surgery    . Cholecystectomy    . Tonsillectomy    . Carpal tunnel release    . Lump in breast     . Gallbladder surgery    . Meniscus repair     Past Medical History  Diagnosis Date  . Hypertension   . Arthritis   . Spinal stenosis   . Left humeral fracture Category 01/23/2012   BP 152/61  Pulse 106  Resp 16  Ht 5\' 2"  (1.575 m)  Wt 203 lb (92.08 kg)  BMI 37.12 kg/m2  SpO2 92%     Review of Systems  Respiratory: Positive for shortness of breath.   Musculoskeletal: Positive for gait problem.  All other systems reviewed and are negative.       Objective:   Physical Exam  Nursing note and vitals reviewed. Constitutional: She is oriented to person, place, and time. She appears well-developed.  HENT:  Head: Atraumatic.  Eyes: EOM are normal. Pupils are equal, round, and reactive to light.  Neck: Normal range of motion.  Neurological: She is alert and oriented to person, place, and time. She has normal strength. No sensory deficit.  Reflex Scores:      Patellar reflexes are 2+ on the right side and 2+ on the left side.      Achilles reflexes are 2+ on the right side and 2+ on the left side. Gait  imbalance uses walker.  Psychiatric: She has a normal mood and affect.          Assessment & Plan:  1. Left hip osteoarthritis improved after intra-articular injection under fluoroscopic guidance.  2. Left lumbar stenosis with probable intermittent left L3 radiculitis. Will increase gabapentin 200 mg 3 times a day

## 2013-07-25 ENCOUNTER — Other Ambulatory Visit: Payer: Self-pay

## 2013-07-25 MED ORDER — TRAMADOL HCL 50 MG PO TABS: 100.0000 mg | ORAL_TABLET | Freq: Three times a day (TID) | ORAL | Status: DC | PRN

## 2013-08-10 ENCOUNTER — Other Ambulatory Visit: Payer: Self-pay

## 2013-08-14 ENCOUNTER — Ambulatory Visit (HOSPITAL_BASED_OUTPATIENT_CLINIC_OR_DEPARTMENT_OTHER): Payer: Medicare Other | Admitting: Physical Medicine & Rehabilitation

## 2013-08-14 ENCOUNTER — Encounter: Payer: Self-pay | Admitting: Physical Medicine & Rehabilitation

## 2013-08-14 ENCOUNTER — Encounter: Payer: Medicare Other | Attending: Physical Medicine & Rehabilitation

## 2013-08-14 VITALS — BP 137/61 | HR 99 | Resp 14 | Ht 62.0 in | Wt 199.2 lb

## 2013-08-14 DIAGNOSIS — I10 Essential (primary) hypertension: Secondary | ICD-10-CM | POA: Insufficient documentation

## 2013-08-14 DIAGNOSIS — M4120 Other idiopathic scoliosis, site unspecified: Secondary | ICD-10-CM

## 2013-08-14 DIAGNOSIS — M47817 Spondylosis without myelopathy or radiculopathy, lumbosacral region: Secondary | ICD-10-CM

## 2013-08-14 DIAGNOSIS — M545 Low back pain, unspecified: Secondary | ICD-10-CM | POA: Insufficient documentation

## 2013-08-14 DIAGNOSIS — IMO0002 Reserved for concepts with insufficient information to code with codable children: Secondary | ICD-10-CM | POA: Insufficient documentation

## 2013-08-14 DIAGNOSIS — M47816 Spondylosis without myelopathy or radiculopathy, lumbar region: Secondary | ICD-10-CM

## 2013-08-14 DIAGNOSIS — M419 Scoliosis, unspecified: Secondary | ICD-10-CM

## 2013-08-14 DIAGNOSIS — M169 Osteoarthritis of hip, unspecified: Secondary | ICD-10-CM

## 2013-08-14 DIAGNOSIS — G8929 Other chronic pain: Secondary | ICD-10-CM

## 2013-08-14 DIAGNOSIS — M48061 Spinal stenosis, lumbar region without neurogenic claudication: Secondary | ICD-10-CM

## 2013-08-14 MED ORDER — OXYCODONE HCL 15 MG PO TABS: 15.0000 mg | ORAL_TABLET | Freq: Three times a day (TID) | ORAL | Status: DC | PRN

## 2013-08-14 NOTE — Progress Notes (Signed)
Subjective:    Patient ID: Kari Martinez, female    DOB: Aug 28, 1924, 79 y.o.   MRN: [id number]  HPI Hip joint pain starting to worsen, pain now 7-8/10. Dancing and especially walking aggravate the pain  Continues to have back pain as well as left thigh pain. Her legs seems to give way at times but does not fall. She uses a walker to ambulate. She never feels weakness when she is in a sitting position. No knee pain when the buckling episodes occurred. These happen about twice a week  She gets partial relief from gabapentin and no adverse effects. Right thigh with numbness and tingling. Pain Inventory Average Pain 7 Pain Right Now 7 My pain is sharp, stabbing and tingling  In the last 24 hours, has pain interfered with the following? General activity 7 Relation with others 8 Enjoyment of life 9 What TIME of day is your pain at its worst? morning Sleep (in general) Fair  Pain is worse with: walking, standing and some activites Pain improves with: rest and medication Relief from Meds: 6  Mobility use a cane use a walker ability to climb steps?  yes do you drive?  yes  Function retired I need assistance with the following:  household duties  Neuro/Psych tingling trouble walking  Prior Studies Any changes since last visit?  no  Physicians involved in your care Any changes since last visit?  no   Family History  Problem Relation Age of Onset  . Heart disease Father    History   Social History  . Marital Status: Widowed    Spouse Name: N/A    Number of Children: N/A  . Years of Education: N/A   Social History Main Topics  . Smoking status: Never Smoker   . Smokeless tobacco: Never Used  . Alcohol Use: No  . Drug Use: No  . Sexual Activity: None   Other Topics Concern  . None   Social History Narrative  . None   Past Surgical History  Procedure Laterality Date  . Breast lumpectomy    . Abdominal hysterectomy    . Thyroid surgery    . Knee surgery    .  Cholecystectomy    . Tonsillectomy    . Carpal tunnel release    . Lump in breast    . Gallbladder surgery    . Meniscus repair     Past Medical History  Diagnosis Date  . Hypertension   . Arthritis   . Spinal stenosis   . Left humeral fracture Category 01/23/2012   BP 137/61  Pulse 99  Resp 14  Ht 5\' 2"  (1.575 m)  Wt 199 lb 3.2 oz (90.357 kg)  BMI 36.43 kg/m2  SpO2 97%    Review of Systems  Musculoskeletal: Positive for gait problem.  Neurological:       Tingling  All other systems reviewed and are negative.       Objective:   Physical Exam  Constitutional: She is oriented to person, place, and time. She appears well-developed.  HENT:  Head: Atraumatic.  Eyes: EOM are normal. Pupils are equal, round, and reactive to light.  Neck: Normal range of motion.  Neurological: She is alert and oriented to person, place, and time. She has normal strength except Left Quad 4/5. Bilateral L3 sensory deficit.  Reflex Scores:  Patellar reflexes are 0 on the right side and 0 on the left side.  Achilles reflexes are 1+ on the right side and 1+  on the left side. Gait imbalance uses walker.  Psychiatric: She has a normal mood and affect.        Assessment & Plan:  1. Left hip osteoarthritis improved after intra-articular injection under fluoroscopic guidance.  2. Left lumbar stenosis with probable intermittent left L3 radiculitis. Will continue gabapentin 200 mg 3 times a day. Discussed signs of worsening stenosis including increasing LE weakness, knee buckling, falls and increased LE numbness.  This would be an indication for spine surgery referral.   Daughter present and understands.  RTC 1 month for repeat R hip intra articular injection under fluoro

## 2013-08-14 NOTE — Patient Instructions (Addendum)
Please let me know if you're having more weakness in your thigh muscles, if more difficulty standing. Or more numbness in the legs. Then we can refer to neurosurgery  Next visit will be for left hip injection  There is another refill left on your gabapentin prescription

## 2013-09-14 ENCOUNTER — Ambulatory Visit (HOSPITAL_BASED_OUTPATIENT_CLINIC_OR_DEPARTMENT_OTHER): Payer: Medicare Other | Admitting: Physical Medicine & Rehabilitation

## 2013-09-14 ENCOUNTER — Encounter: Payer: Medicare Other | Attending: Physical Medicine & Rehabilitation

## 2013-09-14 ENCOUNTER — Encounter: Payer: Self-pay | Admitting: Physical Medicine & Rehabilitation

## 2013-09-14 VITALS — BP 141/54 | HR 112 | Resp 16 | Ht 62.0 in | Wt 195.0 lb

## 2013-09-14 DIAGNOSIS — M47817 Spondylosis without myelopathy or radiculopathy, lumbosacral region: Secondary | ICD-10-CM | POA: Insufficient documentation

## 2013-09-14 DIAGNOSIS — M545 Low back pain, unspecified: Secondary | ICD-10-CM | POA: Insufficient documentation

## 2013-09-14 DIAGNOSIS — IMO0002 Reserved for concepts with insufficient information to code with codable children: Secondary | ICD-10-CM | POA: Insufficient documentation

## 2013-09-14 DIAGNOSIS — M169 Osteoarthritis of hip, unspecified: Secondary | ICD-10-CM

## 2013-09-14 DIAGNOSIS — I10 Essential (primary) hypertension: Secondary | ICD-10-CM | POA: Insufficient documentation

## 2013-09-14 MED ORDER — OXYCODONE HCL 15 MG PO TABS: 15.0000 mg | ORAL_TABLET | Freq: Three times a day (TID) | ORAL | Status: DC | PRN

## 2013-09-14 NOTE — Progress Notes (Signed)
  PROCEDURE RECORD [facility]   Name: Kari Martinez DOB:04-17-1924 MRN: [id number]  Date:09/14/2013  Physician: Claudette Laws, MD    Nurse/CMA: Kerin Perna  Allergies:  Allergies  Allergen Reactions  . Alendronate Sodium Other (See Comments)    Throat swelling  . Ketorolac Other (See Comments)    "Retained fluid in lungs"  . Ketorolac Tromethamine Other (See Comments)    FLUID RETENTION  . Nsaids Other (See Comments)    Elevated kidney function  . Risedronate Sodium Other (See Comments)    Throat swelling and mouth swelling  . Vioxx [Rofecoxib]   . Allopurinol Rash  . Sulfa Antibiotics Rash    Consent Signed: yes  Is patient diabetic? no    Pregnant: no LMP: No LMP recorded. Patient is postmenopausal. (age 70-55)  Anticoagulants: no Anti-inflammatory: no Antibiotics: no  Procedure: Left hip injection  Position: Prone Start Time: 1152  End Time:  1158 Fluoro Time: 26  RN/CMA Levens,CMA Levens,CMA    Time 1120 1203    BP 141/54 133/56    Pulse 112 82    Respirations 16 16    O2 Sat 95 97    S/S 6 6    Pain Level 7/10 4/10     D/C home with Fannie Knee Daughter, patient A & O X 3, D/C instructions reviewed, and sits independently.

## 2013-09-14 NOTE — Patient Instructions (Signed)
Celestone and lidocaine injected

## 2013-09-14 NOTE — Progress Notes (Signed)
Intra-articular hip injection Fluoro guided Indication left hip osteoarthritis pain is only partially responsive to medication management and other conservative care. Informed consent was obtained after describing risks and benefits of this imitation these include bleeding bruising and infection she elects to proceed and has given written consent. Patient placed in the supine. Fluoroscopic images were taken needle insertion site was marked and prepped with Betadine and alcohol entered with a 25-gauge 1.5 inch needle. 3 cc of 1% lidocaine were infiltrate. A 22-gauge 3.5 inch spinal needle was inserted under fluoroscopic guidance to target the junction of the femoral head and the femoral neck on the left side. Bone contact was made. Needle was suggested. Omnipaque 180 injected demonstrated good intra-articular spread. Then a solution containing 1.5 cc of 6 mg per cc Celestone, 3.5 cc one percent lidocaine. Patient tolerated the procedure well Post procedure instructions given Preinjection pain level 7/10 Post injection pain level 4/10

## 2013-09-16 ENCOUNTER — Other Ambulatory Visit: Payer: Self-pay | Admitting: Physical Medicine & Rehabilitation

## 2013-09-18 ENCOUNTER — Telehealth: Payer: Self-pay

## 2013-09-18 MED ORDER — OXYCODONE HCL 15 MG PO TABS: 15.0000 mg | ORAL_TABLET | Freq: Three times a day (TID) | ORAL | Status: DC | PRN

## 2013-09-18 NOTE — Telephone Encounter (Signed)
Oxycodone rx printed today for patient.  Her daughter is aware.

## 2013-09-18 NOTE — Telephone Encounter (Signed)
Per patients daughter, the patient was not given the RX.  Please advise.

## 2013-09-18 NOTE — Telephone Encounter (Signed)
I don't know about the AVS however there is record that the oxycodone was ordered that day. This needs to be checked out patient and her daughter

## 2013-09-18 NOTE — Telephone Encounter (Signed)
Patient was here the other day and did not get an RX for her oxy.  She also does not have a copy of her AVS.

## 2013-10-12 ENCOUNTER — Encounter: Payer: Self-pay | Admitting: Physical Medicine and Rehabilitation

## 2013-10-12 ENCOUNTER — Encounter
Payer: Medicare Other | Attending: Physical Medicine and Rehabilitation | Admitting: Physical Medicine and Rehabilitation

## 2013-10-12 VITALS — BP 164/69 | HR 117 | Resp 16 | Ht 62.0 in | Wt 193.0 lb

## 2013-10-12 DIAGNOSIS — M545 Low back pain, unspecified: Secondary | ICD-10-CM

## 2013-10-12 DIAGNOSIS — M25562 Pain in left knee: Secondary | ICD-10-CM

## 2013-10-12 DIAGNOSIS — M25569 Pain in unspecified knee: Secondary | ICD-10-CM | POA: Insufficient documentation

## 2013-10-12 DIAGNOSIS — G8929 Other chronic pain: Secondary | ICD-10-CM

## 2013-10-12 DIAGNOSIS — M25519 Pain in unspecified shoulder: Secondary | ICD-10-CM

## 2013-10-12 DIAGNOSIS — M25512 Pain in left shoulder: Secondary | ICD-10-CM

## 2013-10-12 DIAGNOSIS — Z8781 Personal history of (healed) traumatic fracture: Secondary | ICD-10-CM | POA: Insufficient documentation

## 2013-10-12 DIAGNOSIS — M48061 Spinal stenosis, lumbar region without neurogenic claudication: Secondary | ICD-10-CM | POA: Insufficient documentation

## 2013-10-12 MED ORDER — OXYCODONE HCL 15 MG PO TABS: 15.0000 mg | ORAL_TABLET | Freq: Three times a day (TID) | ORAL | Status: DC | PRN

## 2013-10-12 MED ORDER — TRAMADOL HCL 50 MG PO TABS: 100.0000 mg | ORAL_TABLET | Freq: Three times a day (TID) | ORAL | Status: DC | PRN

## 2013-10-12 MED ORDER — LIDOCAINE 5 % EX PTCH: MEDICATED_PATCH | CUTANEOUS | Status: DC

## 2013-10-12 NOTE — Patient Instructions (Signed)
Can use BIOFREEZE to joints for arthritis.

## 2013-10-12 NOTE — Progress Notes (Signed)
Subjective:    Patient ID: Kari Martinez, female    DOB: 03-18-1924, 80 y.o.   MRN: [id number]  HPI Kari Martinez is here for medication refill. She continues to have back pain as well as left thigh pain and reports that her legs seems to give way at times but does not fall. She uses a walker to ambulate. She never feels weakness when she is in a sitting position. She got good relief from left hip injection but now her left knee and left shoulder are bothering her. She feels that cold may be exacerbating her left shoulder. She is tolerating current medications without adverse effects.   The problem has been stable, no new medical issues.  Please see nurse's note for review of systems  Review of Systems   Pain Inventory Average Pain 6 Pain Right Now 6 My pain is intermittent and stabbing  In the last 24 hours, has pain interfered with the following? General activity 7 Relation with others 7 Enjoyment of life 9 What TIME of day is your pain at its worst? daytime Sleep (in general) Fair  Pain is worse with: walking and standing Pain improves with: rest, medication and injections Relief from Meds: 8  Mobility use a walker how many minutes can you walk? 5-10 ability to climb steps?  yes do you drive?  yes  Function retired I need assistance with the following:  household duties  Neuro/Psych trouble walking  Prior Studies Any changes since last visit?  no  Physicians involved in your care Any changes since last visit?  no   Family History  Problem Relation Age of Onset  . Heart disease Father    History   Social History  . Marital Status: Widowed    Spouse Name: N/A    Number of Children: N/A  . Years of Education: N/A   Social History Main Topics  . Smoking status: Never Smoker   . Smokeless tobacco: Never Used  . Alcohol Use: No  . Drug Use: No  . Sexual Activity: None   Other Topics Concern  . None   Social History Narrative  . None   Past  Surgical History  Procedure Laterality Date  . Breast lumpectomy    . Abdominal hysterectomy    . Thyroid surgery    . Knee surgery    . Cholecystectomy    . Tonsillectomy    . Carpal tunnel release    . Lump in breast    . Gallbladder surgery    . Meniscus repair     Past Medical History  Diagnosis Date  . Hypertension   . Arthritis   . Spinal stenosis   . Left humeral fracture Category 01/23/2012   BP 164/69  Pulse 117  Resp 16  Ht 5\' 2"  (1.575 m)  Wt 193 lb (87.544 kg)  BMI 35.29 kg/m2  SpO2 99%     Review of Systems  Respiratory:       DOE  Cardiovascular: Positive for leg swelling (bilateral ankles). Negative for chest pain.  Musculoskeletal: Positive for arthralgias, gait problem and myalgias.  Neurological: Negative for dizziness.  Psychiatric/Behavioral: Positive for sleep disturbance. Negative for confusion.  All other systems reviewed and are negative.       Objective:   Physical Exam  Nursing note and vitals reviewed. Constitutional: She is oriented to person, place, and time. She appears well-developed and well-nourished.  Pleasant, obese female. Appears younger than stated age.   HENT:  Head: Normocephalic and atraumatic.  Eyes: Conjunctivae are normal. Pupils are equal, round, and reactive to light.  Neck: Normal range of motion.  Cardiovascular: Normal rate and regular rhythm.   Pulmonary/Chest: Effort normal and breath sounds normal. No respiratory distress. She has no wheezes.  Musculoskeletal: She exhibits edema (Min edema pedally).  Tenderness left anterior knee--no erythema or warmth. Decreased ROM left > right shoulder. Hikes left shoulder.    Neurological: She is alert and oriented to person, place, and time.  She has normal strength except Left Quad 4/5. Bilateral L3 sensory deficit.     Skin: Skin is warm and dry.  Psychiatric: She has a normal mood and affect. Her behavior is normal. Thought content normal.            Assessment & Plan:  1. Left lumbar stenosis with probable intermittent left L3 radiculitis. Will continue gabapentin 200 mg 3 times a day. A Patient denies increasing LE weakness, knee buckling, falls or increased LE numbness. Refilled Oxycodone 15 mg--one every 8 hours prn. Rx # 90--no refills. Ultram 50 mg--2 tablets every 8 hours prn Rx# 180 with 2 refills  (called in by [facility])  2. H/o left humerus fracture: Intolerant of NSAIDs. Trial lidocaine patch # 30 Rx.  If insurance denies this recommended trying Biofreeze to shoulder as well as knees.  3. Left knee pain: Discussed steroid injection for symptom management. She is interested and is to follow up next week with Dr. Letta Pate.

## 2013-10-14 ENCOUNTER — Other Ambulatory Visit: Payer: Self-pay | Admitting: Physical Medicine & Rehabilitation

## 2013-10-17 ENCOUNTER — Encounter: Payer: Self-pay | Admitting: Physical Medicine & Rehabilitation

## 2013-10-17 ENCOUNTER — Ambulatory Visit (HOSPITAL_BASED_OUTPATIENT_CLINIC_OR_DEPARTMENT_OTHER): Payer: Medicare Other | Admitting: Physical Medicine & Rehabilitation

## 2013-10-17 ENCOUNTER — Encounter: Payer: Medicare Other | Attending: Physical Medicine & Rehabilitation

## 2013-10-17 VITALS — BP 153/65 | HR 106 | Resp 14 | Ht 62.0 in | Wt 192.6 lb

## 2013-10-17 DIAGNOSIS — I10 Essential (primary) hypertension: Secondary | ICD-10-CM | POA: Insufficient documentation

## 2013-10-17 DIAGNOSIS — M25569 Pain in unspecified knee: Secondary | ICD-10-CM

## 2013-10-17 DIAGNOSIS — M545 Low back pain, unspecified: Secondary | ICD-10-CM | POA: Insufficient documentation

## 2013-10-17 DIAGNOSIS — M47817 Spondylosis without myelopathy or radiculopathy, lumbosacral region: Secondary | ICD-10-CM | POA: Insufficient documentation

## 2013-10-17 DIAGNOSIS — M25562 Pain in left knee: Secondary | ICD-10-CM

## 2013-10-17 DIAGNOSIS — IMO0002 Reserved for concepts with insufficient information to code with codable children: Secondary | ICD-10-CM | POA: Insufficient documentation

## 2013-10-17 DIAGNOSIS — M169 Osteoarthritis of hip, unspecified: Secondary | ICD-10-CM

## 2013-10-17 DIAGNOSIS — M48061 Spinal stenosis, lumbar region without neurogenic claudication: Secondary | ICD-10-CM

## 2013-10-17 DIAGNOSIS — M1610 Unilateral primary osteoarthritis, unspecified hip: Secondary | ICD-10-CM

## 2013-10-17 MED ORDER — DICLOFENAC SODIUM 1 % TD GEL: 2.0000 g | Freq: Four times a day (QID) | TRANSDERMAL | Status: DC

## 2013-10-17 NOTE — Patient Instructions (Addendum)
I prescribed diclofenac gel to try on your left knee. Even though you have problems with nonsteroidal anti-inflammatories, I don't think that should be a problem. This is not absorbed to any significant degree into the blood stream.

## 2013-10-17 NOTE — Progress Notes (Signed)
Subjective:    Patient ID: Kari Martinez, female    DOB: [birth year], 80 y.o.   MRN: [id number]  HPI  Left intra-articular hip injection under fluoroscopic guidance performed on 09/14/2013 was helpful for the left hip pain.  Chief complaint today is left knee pain which radiates up the thigh. There is minimal no falls or trauma to that area. Has had arthroscopic surgery on the left knee in 2005, meniscus repair Knee pain is mainly with walking. No numbness in the left knee. Pain Inventory Average Pain 7 Pain Right Now 7 My pain is intermittent and stabbing  In the last 24 hours, has pain interfered with the following? General activity 7 Relation with others 8 Enjoyment of life 8 What TIME of day is your pain at its worst? morning, daytime Sleep (in general) Fair  Pain is worse with: walking and standing Pain improves with: rest, medication and injections Relief from Meds: 7  Mobility walk with assistance use a walker how many minutes can you walk? 5-10 ability to climb steps?  yes do you drive?  yes  Function retired I need assistance with the following:  household duties  Neuro/Psych trouble walking  Prior Studies Any changes since last visit?  no  Physicians involved in your care Any changes since last visit?  no   Family History  Problem Relation Age of Onset  . Heart disease Father    History   Social History  . Marital Status: Widowed    Spouse Name: N/A    Number of Children: N/A  . Years of Education: N/A   Social History Main Topics  . Smoking status: Never Smoker   . Smokeless tobacco: Never Used  . Alcohol Use: No  . Drug Use: No  . Sexual Activity: None   Other Topics Concern  . None   Social History Narrative  . None   Past Surgical History  Procedure Laterality Date  . Breast lumpectomy    . Abdominal hysterectomy    . Thyroid surgery    . Knee surgery    . Cholecystectomy    . Tonsillectomy    . Carpal tunnel release    .  Lump in breast    . Gallbladder surgery    . Meniscus repair     Past Medical History  Diagnosis Date  . Hypertension   . Arthritis   . Spinal stenosis   . Left humeral fracture Category 01/23/2012   BP 153/65  Pulse 106  Resp 14  Ht 5\' 2"  (1.575 m)  Wt 192 lb 9.6 oz (87.363 kg)  BMI 35.22 kg/m2  SpO2 97%      Review of Systems  Musculoskeletal: Positive for back pain and gait problem.  All other systems reviewed and are negative.       Objective:   Physical Exam   reduced right L3 sensation at the patella Normal left L3 sensation at the patella  Positive femoral stretch test on the right side Negative straight leg raising test Mildly positive femoral stretch test on the left side Also has knee pain on the left side during femoral stretch test  Deep tendon reflexes are absent in both lower extremities   Tenderness to palpation along the medial and lateral joint lines of the left knee but not the right knee. No tenderness along the patellar tendon. There is medial knee pain with valgus testing but no evidence of joint instability    Assessment & Plan:  1. Left  knee pain this appears to be more in the knee joint than related to lumbar spinal stenosis. We'll check left knee x-ray to see the condition of the joints  2. Right greater than the left L3 radiculopathy secondary to spinal stenosis, she does have some clinical signs but this does not appear to be the major cause of pain  3. Left hip osteoarthritis which responds well to intra-articular injections. Last injection 09/14/2013

## 2013-11-02 ENCOUNTER — Telehealth: Payer: Self-pay

## 2013-11-02 NOTE — Telephone Encounter (Signed)
Patient states she is scheduled for a left knee injection on 2/12. She has been using the Voltaren Gel and has felt a big difference, and is not wanting the injection at this point. She wants to know if she still needs to have the X-Rays done? Does she still need to come to the appt on 2/12?

## 2013-11-02 NOTE — Telephone Encounter (Signed)
Needs to come on a monthly basis secondary to being on oxycodone. May be seen by PA or RN If she would like to try a weaker pain medicine (tramadol) we can extend her visits We do not need to do the knee injection. We do not need to get x-rays unless pain worsens

## 2013-11-03 NOTE — Telephone Encounter (Signed)
Advised patient she would need to keep her monthly visits due to her medication, as far as the xray she is not going to have this done at this time.

## 2013-11-15 ENCOUNTER — Other Ambulatory Visit: Payer: Self-pay | Admitting: Physical Medicine & Rehabilitation

## 2013-11-16 ENCOUNTER — Encounter: Payer: Medicare Other | Attending: Physical Medicine & Rehabilitation

## 2013-11-16 ENCOUNTER — Ambulatory Visit (HOSPITAL_BASED_OUTPATIENT_CLINIC_OR_DEPARTMENT_OTHER): Payer: Medicare Other | Admitting: Physical Medicine & Rehabilitation

## 2013-11-16 ENCOUNTER — Encounter: Payer: Self-pay | Admitting: Physical Medicine & Rehabilitation

## 2013-11-16 VITALS — BP 133/68 | HR 114 | Resp 14 | Ht 62.0 in | Wt 188.0 lb

## 2013-11-16 DIAGNOSIS — M545 Low back pain, unspecified: Secondary | ICD-10-CM

## 2013-11-16 DIAGNOSIS — M47817 Spondylosis without myelopathy or radiculopathy, lumbosacral region: Secondary | ICD-10-CM | POA: Insufficient documentation

## 2013-11-16 DIAGNOSIS — M419 Scoliosis, unspecified: Secondary | ICD-10-CM

## 2013-11-16 DIAGNOSIS — M48061 Spinal stenosis, lumbar region without neurogenic claudication: Secondary | ICD-10-CM

## 2013-11-16 DIAGNOSIS — IMO0002 Reserved for concepts with insufficient information to code with codable children: Secondary | ICD-10-CM | POA: Insufficient documentation

## 2013-11-16 DIAGNOSIS — I10 Essential (primary) hypertension: Secondary | ICD-10-CM | POA: Insufficient documentation

## 2013-11-16 DIAGNOSIS — M169 Osteoarthritis of hip, unspecified: Secondary | ICD-10-CM

## 2013-11-16 DIAGNOSIS — M4120 Other idiopathic scoliosis, site unspecified: Secondary | ICD-10-CM

## 2013-11-16 DIAGNOSIS — M1610 Unilateral primary osteoarthritis, unspecified hip: Secondary | ICD-10-CM

## 2013-11-16 DIAGNOSIS — G8929 Other chronic pain: Secondary | ICD-10-CM

## 2013-11-16 MED ORDER — OXYCODONE HCL 15 MG PO TABS: 15.0000 mg | ORAL_TABLET | Freq: Three times a day (TID) | ORAL | Status: DC | PRN

## 2013-11-16 MED ORDER — TRAMADOL HCL 50 MG PO TABS: 50.0000 mg | ORAL_TABLET | Freq: Four times a day (QID) | ORAL | Status: DC

## 2013-11-16 NOTE — Patient Instructions (Signed)
If reduced dose of tramadol is not effective, please call the office. Try reduced dose for at least 3-4 days prior to calling

## 2013-11-16 NOTE — Progress Notes (Signed)
Subjective:    Patient ID: Kari Martinez, female    DOB: Dec 04, 1923, 80 y.o.   MRN: [id number]  HPI Left intra-articular hip injection under fluoroscopic guidance performed on 09/14/2013 was helpful for the left hip pain.  Chief complaint today is left knee pain which radiates up the thigh. There is minimal no falls or trauma to that area. Has had arthroscopic surgery on the left knee in 2005, meniscus repair  Knee pain is mainly with walking. No numbness in the left knee.  Voltaren gel helping with Left knee 4 times a day  Continues to take oxycodone 15 mg 3 times a day Continues to take tramadol 100 mg 3 times per day No falls No dizziness No constipation  Pain Inventory Average Pain 6 Pain Right Now 6 My pain is intermittent and dull  In the last 24 hours, has pain interfered with the following? General activity 6 Relation with others 6 Enjoyment of life 8 What TIME of day is your pain at its worst? morning, daytime Sleep (in general) Fair  Pain is worse with: walking and standing Pain improves with: rest and medication Relief from Meds: 6  Mobility walk with assistance use a walker how many minutes can you walk? 5-10 ability to climb steps?  yes do you drive?  yes  Function retired I need assistance with the following:  household duties  Neuro/Psych trouble walking  Prior Studies Any changes since last visit?  no  Physicians involved in your care Any changes since last visit?  no   Family History  Problem Relation Age of Onset  . Heart disease Father    History   Social History  . Marital Status: Widowed    Spouse Name: N/A    Number of Children: N/A  . Years of Education: N/A   Social History Main Topics  . Smoking status: Never Smoker   . Smokeless tobacco: Never Used  . Alcohol Use: No  . Drug Use: No  . Sexual Activity: None   Other Topics Concern  . None   Social History Narrative  . None   Past Surgical History  Procedure  Laterality Date  . Breast lumpectomy    . Abdominal hysterectomy    . Thyroid surgery    . Knee surgery    . Cholecystectomy    . Tonsillectomy    . Carpal tunnel release    . Lump in breast    . Gallbladder surgery    . Meniscus repair     Past Medical History  Diagnosis Date  . Hypertension   . Arthritis   . Spinal stenosis   . Left humeral fracture Category 01/23/2012   BP 133/68  Pulse 114  Resp 14  Ht 5\' 2"  (1.575 m)  Wt 188 lb (85.276 kg)  BMI 34.38 kg/m2  SpO2 97%  Opioid Risk Score:   Fall Risk Score: Moderate Fall Risk (6-13 points) (pt educated on fall risk, brochure given to pt.)   Review of Systems  Musculoskeletal: Positive for back pain and gait problem.  All other systems reviewed and are negative.       Objective:   Physical Exam  Straight leg raising test is negative Left knee without erythema Left knee with good range of motion with flexion and extension, no erythema Lumbar spine without tenderness to palpation does have scoliosis convex right  Left hip has reduced range of motion with the internal and external rotation. Good flexion  Assessment & Plan:  1. Left knee pain this appears to be more in the knee joint than related to lumbar spinal stenosis.  We'll check left knee x-ray to see the condition of the joints  2. Right greater than the left L3 radiculopathy secondary to spinal stenosis, she does have some clinical signs but this does not appear to be the major cause of pain  3. Left hip osteoarthritis which responds well to intra-articular injections. Last injection 09/14/2013 4. Chronic pain syndrome multifactorial, we'll reduce tramadol to 50 mg 3 times a day. Continue oxycodone 15 mg 3 times per day. Recheck in one month determine if any further adjustments are needed at that time

## 2013-11-18 ENCOUNTER — Encounter: Payer: Self-pay | Admitting: *Deleted

## 2013-12-08 ENCOUNTER — Other Ambulatory Visit: Payer: Self-pay | Admitting: *Deleted

## 2013-12-08 MED ORDER — OXYCODONE HCL 15 MG PO TABS: 15.0000 mg | ORAL_TABLET | Freq: Three times a day (TID) | ORAL | Status: DC | PRN

## 2013-12-08 NOTE — Telephone Encounter (Signed)
RX printed for MD to sign for RN visit 12/11/13 

## 2013-12-11 ENCOUNTER — Encounter: Payer: Medicare Other | Attending: Physical Medicine & Rehabilitation | Admitting: *Deleted

## 2013-12-11 ENCOUNTER — Encounter: Payer: Self-pay | Admitting: *Deleted

## 2013-12-11 VITALS — BP 134/56 | HR 101 | Resp 14

## 2013-12-11 DIAGNOSIS — G8929 Other chronic pain: Secondary | ICD-10-CM

## 2013-12-11 DIAGNOSIS — I10 Essential (primary) hypertension: Secondary | ICD-10-CM | POA: Insufficient documentation

## 2013-12-11 DIAGNOSIS — M25569 Pain in unspecified knee: Secondary | ICD-10-CM | POA: Insufficient documentation

## 2013-12-11 DIAGNOSIS — M169 Osteoarthritis of hip, unspecified: Secondary | ICD-10-CM

## 2013-12-11 DIAGNOSIS — M48061 Spinal stenosis, lumbar region without neurogenic claudication: Secondary | ICD-10-CM | POA: Insufficient documentation

## 2013-12-11 DIAGNOSIS — M545 Low back pain: Secondary | ICD-10-CM

## 2013-12-11 NOTE — Patient Instructions (Signed)
Follow up with Dr Letta Pate 01/08/14

## 2013-12-11 NOTE — Progress Notes (Signed)
Here for pill count and medication refills. Oxycodone 15 mg #90 Fill date 11/16/13    Today NV# 21  VSS   She is a moderate fall risk but has had no falls.  She was educated and given a handout on fall prevention in the home at last visit.  No changes.  She has an appt scheduled to see Dr Corene Cornea 01/08/14.  Pill count appropriate and refill given.

## 2013-12-20 ENCOUNTER — Other Ambulatory Visit: Payer: Self-pay

## 2013-12-20 ENCOUNTER — Other Ambulatory Visit: Payer: Self-pay | Admitting: Physical Medicine & Rehabilitation

## 2014-01-08 ENCOUNTER — Encounter: Payer: Medicare Other | Attending: Physical Medicine & Rehabilitation

## 2014-01-08 ENCOUNTER — Encounter: Payer: Self-pay | Admitting: Physical Medicine & Rehabilitation

## 2014-01-08 ENCOUNTER — Ambulatory Visit (HOSPITAL_BASED_OUTPATIENT_CLINIC_OR_DEPARTMENT_OTHER): Payer: Medicare Other | Admitting: Physical Medicine & Rehabilitation

## 2014-01-08 VITALS — BP 152/56 | HR 110 | Resp 14 | Ht 62.0 in | Wt 184.0 lb

## 2014-01-08 DIAGNOSIS — IMO0002 Reserved for concepts with insufficient information to code with codable children: Secondary | ICD-10-CM | POA: Insufficient documentation

## 2014-01-08 DIAGNOSIS — M48061 Spinal stenosis, lumbar region without neurogenic claudication: Secondary | ICD-10-CM

## 2014-01-08 DIAGNOSIS — M545 Low back pain, unspecified: Secondary | ICD-10-CM | POA: Insufficient documentation

## 2014-01-08 DIAGNOSIS — I10 Essential (primary) hypertension: Secondary | ICD-10-CM | POA: Insufficient documentation

## 2014-01-08 DIAGNOSIS — M1610 Unilateral primary osteoarthritis, unspecified hip: Secondary | ICD-10-CM

## 2014-01-08 DIAGNOSIS — M169 Osteoarthritis of hip, unspecified: Secondary | ICD-10-CM

## 2014-01-08 DIAGNOSIS — M47817 Spondylosis without myelopathy or radiculopathy, lumbosacral region: Secondary | ICD-10-CM | POA: Insufficient documentation

## 2014-01-08 MED ORDER — OXYCODONE HCL 15 MG PO TABS: 15.0000 mg | ORAL_TABLET | Freq: Three times a day (TID) | ORAL | Status: DC | PRN

## 2014-01-08 MED ORDER — TRAMADOL HCL 50 MG PO TABS: 50.0000 mg | ORAL_TABLET | Freq: Three times a day (TID) | ORAL | Status: DC

## 2014-01-08 NOTE — Progress Notes (Signed)
Subjective:    Patient ID: Kari Martinez, female    DOB: 1924-04-24, 80 y.o.   MRN: [id number]  HPI No falls Relatively well with knee pain and hip pain as well as back pain. Currently taking tramadol 50 mg 4 times a day, takes in between oxycodone doses Oxycodone 15 mg 3 times per day, morning , 2pm, bedtime ~10p Pain Inventory Average Pain 8 Pain Right Now 8 My pain is intermittent and stabbing  In the last 24 hours, has pain interfered with the following? General activity 6 Relation with others 7 Enjoyment of life 10 What TIME of day is your pain at its worst? daytime Sleep (in general) Fair  Pain is worse with: walking and standing Pain improves with: rest and medication Relief from Meds: 7  Mobility walk with assistance use a walker how many minutes can you walk? 5-10 ability to climb steps?  yes do you drive?  yes  Function retired I need assistance with the following:  shopping  Neuro/Psych trouble walking  Prior Studies Any changes since last visit?  no  Physicians involved in your care Any changes since last visit?  no   Family History  Problem Relation Age of Onset  . Heart disease Father    History   Social History  . Marital Status: Widowed    Spouse Name: N/A    Number of Children: N/A  . Years of Education: N/A   Social History Main Topics  . Smoking status: Never Smoker   . Smokeless tobacco: Never Used  . Alcohol Use: No  . Drug Use: No  . Sexual Activity: None   Other Topics Concern  . None   Social History Narrative  . None   Past Surgical History  Procedure Laterality Date  . Breast lumpectomy    . Abdominal hysterectomy      Ovaries intact  . Thyroid surgery      Partial, left lobe  . Knee surgery      left torn meniscus  . Cholecystectomy    . Tonsillectomy    . Carpal tunnel release      right  . Lump in breast      Left Benign  . Gallbladder surgery    . Meniscus repair    . Radiofrequency ablation nerves       by Dr. Letta Pate  . Cataract extraction, bilateral      and lens implants Dr. Katy Fitch 2010  . Cholecystectomy    . Dilation and curettage of uterus     Past Medical History  Diagnosis Date  . Hypertension   . Arthritis   . Spinal stenosis     lumbar, bulging disc, receiving injections 01-01-2011,02-12-2011, Dr. Letta Pate (pain management)  . Left humeral fracture Category 01/23/2012  . Osteoarthritis   . Hyperlipidemia   . Thyroid disease     Hypo  . Osteopenia   . Chronic renal insufficiency, stage III (moderate)   . Nephrosclerosis     prn, Dr. Moshe Cipro  . Gout     Dr. Ouida Sills Rheumatologist   BP 152/56  Pulse 110  Resp 14  Ht 5\' 2"  (1.575 m)  Wt 184 lb (83.462 kg)  BMI 33.65 kg/m2  SpO2 94%  Opioid Risk Score:   Fall Risk Score: Moderate Fall Risk (6-13 points) (pt educated and given a brochure on fall risk previously)    Review of Systems  Respiratory: Positive for cough and shortness of breath.   Musculoskeletal: Positive for  back pain and gait problem.  All other systems reviewed and are negative.       Objective:   Physical Exam Dextro convex scoliosis Ambulates with decreased swing phase and short duration stance phase right lower tremor the Decreased hip internal/external rotation a left side normal on the right side No tenderness to palpation in the lumbar paraspinal muscles Straight leg raising test is negative Lower extremity strength normal in the hip flexors knee extensors ankle dorsiflexor on flexor Lumbar range of motion was reduced with extension and lateral bending relatively intact flexion      Assessment & Plan:  1. Left knee pain this appears to be more in the knee joint than related to lumbar spinal stenosis.  We'll check left knee x-ray to see the condition of the joints  2. Right greater than the left L3 radiculopathy secondary to spinal stenosis, she does have some clinical signs but this does not appear to be the major cause  of pain  3. Left hip osteoarthritis which responds well to intra-articular injections. Last injection 09/14/2013  4. Chronic pain syndrome multifactorial, we'll reduce tramadol to 50 mg 3 times a day. Continue oxycodone 15 mg 3 times per day. Recheck in one month determine if any further adjustments are needed at that time  Continue opioid monitoring program. This consists of regular clinic visits, examinations, urine drug screen, pill counts as well as use of [place] controlled substance reporting System.

## 2014-01-08 NOTE — Patient Instructions (Signed)
Reduce tramadol to 3 times per day Continue oxycodone 3 times per day Call if hip pain increases so we can schedule a left hip injection under x-ray guidance

## 2014-01-18 ENCOUNTER — Telehealth: Payer: Self-pay

## 2014-01-18 NOTE — Telephone Encounter (Signed)
May increase tramadol to QID #120 1 RF

## 2014-01-18 NOTE — Telephone Encounter (Signed)
Patient states she is having increased pain in her left side and hip. She is requestingTramadol QID, to see if that helps with the pain. She said if increasing the Tramadol does not help she may need another injection.

## 2014-01-19 MED ORDER — TRAMADOL HCL 50 MG PO TABS: 50.0000 mg | ORAL_TABLET | Freq: Four times a day (QID) | ORAL | Status: DC

## 2014-01-19 NOTE — Telephone Encounter (Signed)
New rx for tramadol called into [facility].  Patient aware.

## 2014-01-25 ENCOUNTER — Other Ambulatory Visit: Payer: Self-pay | Admitting: Physical Medicine & Rehabilitation

## 2014-02-08 ENCOUNTER — Ambulatory Visit (HOSPITAL_BASED_OUTPATIENT_CLINIC_OR_DEPARTMENT_OTHER): Payer: Medicare Other | Admitting: Physical Medicine & Rehabilitation

## 2014-02-08 ENCOUNTER — Encounter: Payer: Medicare Other | Attending: Physical Medicine & Rehabilitation

## 2014-02-08 ENCOUNTER — Encounter: Payer: Self-pay | Admitting: Physical Medicine & Rehabilitation

## 2014-02-08 VITALS — BP 113/39 | HR 84 | Resp 14 | Ht 62.0 in | Wt 184.0 lb

## 2014-02-08 DIAGNOSIS — M419 Scoliosis, unspecified: Secondary | ICD-10-CM

## 2014-02-08 DIAGNOSIS — M169 Osteoarthritis of hip, unspecified: Secondary | ICD-10-CM

## 2014-02-08 DIAGNOSIS — M1610 Unilateral primary osteoarthritis, unspecified hip: Secondary | ICD-10-CM

## 2014-02-08 DIAGNOSIS — M48061 Spinal stenosis, lumbar region without neurogenic claudication: Secondary | ICD-10-CM

## 2014-02-08 DIAGNOSIS — M47817 Spondylosis without myelopathy or radiculopathy, lumbosacral region: Secondary | ICD-10-CM | POA: Insufficient documentation

## 2014-02-08 DIAGNOSIS — IMO0002 Reserved for concepts with insufficient information to code with codable children: Secondary | ICD-10-CM | POA: Insufficient documentation

## 2014-02-08 DIAGNOSIS — I10 Essential (primary) hypertension: Secondary | ICD-10-CM | POA: Insufficient documentation

## 2014-02-08 DIAGNOSIS — M545 Low back pain, unspecified: Secondary | ICD-10-CM | POA: Insufficient documentation

## 2014-02-08 DIAGNOSIS — M4120 Other idiopathic scoliosis, site unspecified: Secondary | ICD-10-CM

## 2014-02-08 DIAGNOSIS — M5416 Radiculopathy, lumbar region: Secondary | ICD-10-CM

## 2014-02-08 MED ORDER — OXYCODONE HCL 15 MG PO TABS: 15.0000 mg | ORAL_TABLET | Freq: Three times a day (TID) | ORAL | Status: DC | PRN

## 2014-02-08 NOTE — Patient Instructions (Signed)
If you become constipated on your pain medications she may try something like Colace  If her left hip or groin area becomes more painful especially with walking I would recommend repeat left hip injection under fluoroscopy

## 2014-02-08 NOTE — Progress Notes (Signed)
Subjective:    Patient ID: Kari Martinez, female    DOB: [birth year], 80 y.o.   MRN: [id number]  HPI Patient tried reducing tramadol dosage but experienced increased back pain and hip pain and leg pain after trying this. Continues on oxycodone 15 mg 3 times a day, tramadol 50 mg 4 times a day. Overall the patient feels like she is doing well. She is satisfied with her current medication regimen No falls No new medical issues. No surgeries planned. Pain Inventory Average Pain 7 Pain Right Now 8 My pain is intermittent and tingling  In the last 24 hours, has pain interfered with the following? General activity 5 Relation with others 7 Enjoyment of life 9 What TIME of day is your pain at its worst? daytime Sleep (in general) Fair  Pain is worse with: walking and standing Pain improves with: rest, medication and injections Relief from Meds: 7  Mobility use a walker how many minutes can you walk? 5-10 ability to climb steps?  yes do you drive?  yes  Function retired I need assistance with the following:  household duties  Neuro/Psych trouble walking  Prior Studies Any changes since last visit?  no  Physicians involved in your care Any changes since last visit?  no   Family History  Problem Relation Age of Onset  . Heart disease Father    History   Social History  . Marital Status: Widowed    Spouse Name: N/A    Number of Children: N/A  . Years of Education: N/A   Social History Main Topics  . Smoking status: Never Smoker   . Smokeless tobacco: Never Used  . Alcohol Use: No  . Drug Use: No  . Sexual Activity: None   Other Topics Concern  . None   Social History Narrative  . None   Past Surgical History  Procedure Laterality Date  . Breast lumpectomy    . Abdominal hysterectomy      Ovaries intact  . Thyroid surgery      Partial, left lobe  . Knee surgery      left torn meniscus  . Cholecystectomy    . Tonsillectomy    . Carpal tunnel release       right  . Lump in breast      Left Benign  . Gallbladder surgery    . Meniscus repair    . Radiofrequency ablation nerves      by Dr. Letta Pate  . Cataract extraction, bilateral      and lens implants Dr. Katy Fitch 2010  . Cholecystectomy    . Dilation and curettage of uterus     Past Medical History  Diagnosis Date  . Hypertension   . Arthritis   . Spinal stenosis     lumbar, bulging disc, receiving injections 01-01-2011,02-12-2011, Dr. Letta Pate (pain management)  . Left humeral fracture Category 01/23/2012  . Osteoarthritis   . Hyperlipidemia   . Thyroid disease     Hypo  . Osteopenia   . Chronic renal insufficiency, stage III (moderate)   . Nephrosclerosis     prn, Dr. Moshe Cipro  . Gout     Dr. Ouida Sills Rheumatologist   BP 113/39  Pulse 84  Resp 14  Ht 5\' 2"  (1.575 m)  Wt 184 lb (83.462 kg)  BMI 33.65 kg/m2  SpO2 95%  Opioid Risk Score:   Fall Risk Score: Moderate Fall Risk (6-13 points) (Patient edcuated handout declined)   Review of Systems  Musculoskeletal:  Positive for back pain and gait problem.  All other systems reviewed and are negative.      Objective:   Physical Exam  Nursing note and vitals reviewed. Constitutional: She is oriented to person, place, and time. She appears well-developed and well-nourished.  HENT:  Head: Normocephalic and atraumatic.  Eyes: Conjunctivae and EOM are normal. Pupils are equal, round, and reactive to light.  Neck: Normal range of motion.  Musculoskeletal:       Left hip: She exhibits decreased range of motion.       Thoracic back: She exhibits decreased range of motion and deformity. She exhibits no tenderness.       Lumbar back: She exhibits decreased range of motion and deformity. She exhibits no pain and no spasm.  Right dextroconvex lumbar scoliosis centered at L4 with compensatory thoracic curve  Decreased left hip internal and external rotation  Neurological: She is alert and oriented to person, place,  and time.  Psychiatric: She has a normal mood and affect.   Reduced sensory right L4 dermatome to pinprick       Assessment & Plan:  1. Lumbar spinal stenosis with right L3-4 radiculitis sensory only, patient does not wish to have any surgery, at the current time it is not painful and does not wish to have an epidural injection. No signs of gait imbalance or other functional limitations 2. Left greater than right hip osteoarthritis had good improvement after hip injection. If her groin pain or left hip pain increases and starts limiting her ambulation, I would recommend repeat intra-articular left hip injection under fluoroscopic guidance here at the office  #3. Chronic pain syndrome overall good control without medication side effects. Continue: Oxycodone 15 mg 3 times a day #91 month supply Continue tramadol 50 mg 1 per os 4 times a day. Has one more refill. Followup with nurse practitioner 1 month at which time she can get 5 refills on the tramadol  Discussed with the patient as well as her granddaughter who is a Marine scientist. Understand plan

## 2014-02-15 ENCOUNTER — Other Ambulatory Visit: Payer: Self-pay | Admitting: Physical Medicine & Rehabilitation

## 2014-03-12 ENCOUNTER — Encounter: Payer: Self-pay | Admitting: Registered Nurse

## 2014-03-12 ENCOUNTER — Encounter: Payer: Medicare Other | Attending: Physical Medicine & Rehabilitation | Admitting: Registered Nurse

## 2014-03-12 VITALS — BP 153/68 | HR 87 | Resp 14 | Wt 188.0 lb

## 2014-03-12 DIAGNOSIS — M25569 Pain in unspecified knee: Secondary | ICD-10-CM | POA: Insufficient documentation

## 2014-03-12 DIAGNOSIS — Z5181 Encounter for therapeutic drug level monitoring: Secondary | ICD-10-CM

## 2014-03-12 DIAGNOSIS — M4120 Other idiopathic scoliosis, site unspecified: Secondary | ICD-10-CM

## 2014-03-12 DIAGNOSIS — Z79899 Other long term (current) drug therapy: Secondary | ICD-10-CM

## 2014-03-12 DIAGNOSIS — M5416 Radiculopathy, lumbar region: Secondary | ICD-10-CM

## 2014-03-12 DIAGNOSIS — M169 Osteoarthritis of hip, unspecified: Secondary | ICD-10-CM

## 2014-03-12 DIAGNOSIS — I10 Essential (primary) hypertension: Secondary | ICD-10-CM | POA: Insufficient documentation

## 2014-03-12 DIAGNOSIS — M1610 Unilateral primary osteoarthritis, unspecified hip: Secondary | ICD-10-CM

## 2014-03-12 DIAGNOSIS — M48061 Spinal stenosis, lumbar region without neurogenic claudication: Secondary | ICD-10-CM

## 2014-03-12 DIAGNOSIS — IMO0002 Reserved for concepts with insufficient information to code with codable children: Secondary | ICD-10-CM

## 2014-03-12 DIAGNOSIS — M419 Scoliosis, unspecified: Secondary | ICD-10-CM

## 2014-03-12 MED ORDER — OXYCODONE HCL 15 MG PO TABS: 15.0000 mg | ORAL_TABLET | Freq: Three times a day (TID) | ORAL | Status: DC | PRN

## 2014-03-12 MED ORDER — TRAMADOL HCL 50 MG PO TABS: 50.0000 mg | ORAL_TABLET | Freq: Four times a day (QID) | ORAL | Status: DC

## 2014-03-12 NOTE — Progress Notes (Signed)
Subjective:    Patient ID: Kari Martinez, female    DOB: [birth year], 80 y.o.   MRN: [id number]  HPI: [name] is a 80 year old female who returns for follow up for chronic pain and medication refill. She says her pain is located in her lower back, left buttock and pain radiates laterally down her leg. She rates her pain 8. He current exercise is walking short distances, she was encouraged to start chair exercises and build her endurance. She verbalized understanding. Her daughter in room all questions answered.  Pain Inventory Average Pain 8 Pain Right Now 8 My pain is sharp, stabbing and tingling  In the last 24 hours, has pain interfered with the following? General activity 7 Relation with others 8 Enjoyment of life 9 What TIME of day is your pain at its worst? morning and daytime Sleep (in general) Fair  Pain is worse with: walking and standing Pain improves with: rest and medication Relief from Meds: 7  Mobility use a walker how many minutes can you walk? 5 ability to climb steps?  yes do you drive?  yes  Function I need assistance with the following:  household duties and shopping  Neuro/Psych trouble walking  Prior Studies Any changes since last visit?  no  Physicians involved in your care Any changes since last visit?  no   Family History  Problem Relation Age of Onset  . Heart disease Father    History   Social History  . Marital Status: Widowed    Spouse Name: N/A    Number of Children: N/A  . Years of Education: N/A   Social History Main Topics  . Smoking status: Never Smoker   . Smokeless tobacco: Never Used  . Alcohol Use: No  . Drug Use: No  . Sexual Activity: None   Other Topics Concern  . None   Social History Narrative  . None   Past Surgical History  Procedure Laterality Date  . Breast lumpectomy    . Abdominal hysterectomy      Ovaries intact  . Thyroid surgery      Partial, left lobe  . Knee surgery      left torn  meniscus  . Cholecystectomy    . Tonsillectomy    . Carpal tunnel release      right  . Lump in breast      Left Benign  . Gallbladder surgery    . Meniscus repair    . Radiofrequency ablation nerves      by Dr. Letta Pate  . Cataract extraction, bilateral      and lens implants Dr. Katy Fitch 2010  . Cholecystectomy    . Dilation and curettage of uterus     Past Medical History  Diagnosis Date  . Hypertension   . Arthritis   . Spinal stenosis     lumbar, bulging disc, receiving injections 01-01-2011,02-12-2011, Dr. Letta Pate (pain management)  . Left humeral fracture Category 01/23/2012  . Osteoarthritis   . Hyperlipidemia   . Thyroid disease     Hypo  . Osteopenia   . Chronic renal insufficiency, stage III (moderate)   . Nephrosclerosis     prn, Dr. Moshe Cipro  . Gout     Dr. Ouida Sills Rheumatologist   BP 153/68  Pulse 87  Resp 14  Wt 188 lb (85.276 kg)  SpO2 95%  Opioid Risk Score:   Fall Risk Score: Moderate Fall Risk (6-13 points) (educated and handout given for  fall prefention in the home at previous visit)  Review of Systems  Musculoskeletal: Positive for gait problem.  All other systems reviewed and are negative.      Objective:   Physical Exam  Nursing note and vitals reviewed. Constitutional: She is oriented to person, place, and time. She appears well-developed and well-nourished.  HENT:  Head: Normocephalic and atraumatic.  Neck: Normal range of motion. Neck supple.  Cardiovascular: An irregular rhythm present.  Murmur heard. Pulmonary/Chest: Effort normal and breath sounds normal.  Musculoskeletal:  Normal Muscle Bulk and Muscle Testing Reveals: Upper Extremities: Right Arm Full ROM and Muscle Strength 5/5. Left Arm Decreased ROM 45 degress muscle strength 5/5. Left arm Full Extension. Lower Extremities: Right Leg Full ROM and Muscle Strength 5/5 Left Arm: Left leg flexion produces pain into groin Arises from chair with ease Uses a walker Narrow  Based Gait  Neurological: She is alert and oriented to person, place, and time.  Skin: Skin is warm and dry.  Psychiatric: She has a normal mood and affect.          Assessment & Plan:  1. Lumbar spinal stenosis with right L3-4 radiculitis sensory only: Encouraged to increase activity and use heat therapy. 2. Left greater than right hip osteoarthritis: Wants a steroid injection at next visit. This has been scheduled with Dr. Letta Pate. #3. Chronic pain syndrome: Good Relief with current medication regime Refilled: Oxycodone 15 mg one tablet 3 times a day #90 and  Tramadol 50 mg 1 tablet 4 times a day #120.  20 minutes of face to face patient care time was spent during this visit. All questions were encouraged and answered.  F/u in 1 month

## 2014-04-03 ENCOUNTER — Other Ambulatory Visit: Payer: Self-pay | Admitting: Physical Medicine & Rehabilitation

## 2014-04-16 ENCOUNTER — Telehealth: Payer: Self-pay

## 2014-04-16 MED ORDER — OXYCODONE HCL 15 MG PO TABS: 15.0000 mg | ORAL_TABLET | Freq: Three times a day (TID) | ORAL | Status: DC | PRN

## 2014-04-16 NOTE — Telephone Encounter (Signed)
Patient informed that her oxycodone rx is ready for pick up.

## 2014-04-16 NOTE — Telephone Encounter (Signed)
Patient called requesting oxycodone refill.  She will be out before her next appointment.  She would like to have her daughter pick it up.  Rx printed to be signed.  Will call patient when rx is ready for pick up.

## 2014-04-24 ENCOUNTER — Encounter: Payer: Self-pay | Admitting: Physical Medicine & Rehabilitation

## 2014-04-24 ENCOUNTER — Encounter: Payer: Medicare Other | Attending: Physical Medicine & Rehabilitation

## 2014-04-24 ENCOUNTER — Ambulatory Visit (HOSPITAL_BASED_OUTPATIENT_CLINIC_OR_DEPARTMENT_OTHER): Payer: Medicare Other | Admitting: Physical Medicine & Rehabilitation

## 2014-04-24 VITALS — BP 147/71 | HR 99 | Resp 14 | Wt 186.0 lb

## 2014-04-24 DIAGNOSIS — IMO0002 Reserved for concepts with insufficient information to code with codable children: Secondary | ICD-10-CM | POA: Diagnosis not present

## 2014-04-24 DIAGNOSIS — M160 Bilateral primary osteoarthritis of hip: Secondary | ICD-10-CM

## 2014-04-24 DIAGNOSIS — M545 Low back pain, unspecified: Secondary | ICD-10-CM | POA: Insufficient documentation

## 2014-04-24 DIAGNOSIS — I10 Essential (primary) hypertension: Secondary | ICD-10-CM | POA: Insufficient documentation

## 2014-04-24 DIAGNOSIS — M1610 Unilateral primary osteoarthritis, unspecified hip: Secondary | ICD-10-CM

## 2014-04-24 DIAGNOSIS — M47817 Spondylosis without myelopathy or radiculopathy, lumbosacral region: Secondary | ICD-10-CM | POA: Diagnosis not present

## 2014-04-24 MED ORDER — OXYCODONE HCL 15 MG PO TABS: 15.0000 mg | ORAL_TABLET | Freq: Three times a day (TID) | ORAL | Status: DC | PRN

## 2014-04-24 MED ORDER — DICLOFENAC SODIUM 1 % TD GEL: TRANSDERMAL | Status: DC

## 2014-04-24 NOTE — Patient Instructions (Signed)
Aspercreme may be an alternative to voltaren gel

## 2014-04-24 NOTE — Progress Notes (Signed)
Intra-articular hip injection Fluoro guided Indication left hip osteoarthritis pain is only partially responsive to medication management and other conservative care. Informed consent was obtained after describing risks and benefits of this imitation these include bleeding bruising and infection she elects to proceed and has given written consent. Patient placed in the supine. Fluoroscopic images were taken needle insertion site was marked and prepped with Betadine and alcohol entered with a 25-gauge 1.5 inch needle. 3 cc of 1% lidocaine were infiltrate. A 22-gauge 3.5 inch spinal needle was inserted under fluoroscopic guidance to target the junction of the femoral head and the femoral neck on the left side. Bone contact was made. Needle was suggested. Omnipaque 180 injected demonstrated good intra-articular spread. Then a solution containing 1.5 cc of 6 mg per cc Celestone, 3.5 cc one percent lidocaine. Patient tolerated the procedure well Post procedure instructions given Preinjection pain level 7-8/10 Post injection pain level 4/10, with walking

## 2014-04-24 NOTE — Progress Notes (Signed)
  PROCEDURE RECORD [facility]   Name: RAFAEL QUESADA DOB:26-Oct-1923 MRN: [id number]  Date:04/24/2014  Physician: Alysia Penna, MD    Nurse/CMA: Jersey Ravenscroft RN  Allergies:  Allergies  Allergen Reactions  . Alendronate Sodium Other (See Comments)    Throat swelling  . Ketorolac Other (See Comments)    "Retained fluid in lungs"  . Ketorolac Tromethamine Other (See Comments)    FLUID RETENTION  . Mobic [Meloxicam]     Kidney decline  . Nsaids Other (See Comments)    Elevated kidney function  . Risedronate Sodium Other (See Comments)    Throat swelling and mouth swelling  . Septra [Sulfamethoxazole-Tmp Ds]     Rash  . Vioxx [Rofecoxib]     Kidney decline  . Allopurinol Rash  . Sulfa Antibiotics Rash    Consent Signed: Yes.    Is patient diabetic? No.  CBG today?   Pregnant: No. LMP: No LMP recorded. Patient is postmenopausal. (age 11-55)  Anticoagulants: no Anti-inflammatory: no Antibiotics: no  Procedure: Left intra articular hip injection Position: Supine Start Time: 10:15 End Time: 10:19 Fluoro Time: 19sec  RN/CMA Biomedical engineer    Time 9:06 10:25    BP 147/71 168/64    Pulse 99 84    Respirations 14 14    O2 Sat 96 98    S/S 6 6    Pain Level 8/10 8/10     D/C home with Renaee Munda, patient A & O X 3, D/C instructions reviewed, and sits independently.

## 2014-05-14 ENCOUNTER — Other Ambulatory Visit: Payer: Self-pay | Admitting: Physical Medicine & Rehabilitation

## 2014-06-07 ENCOUNTER — Other Ambulatory Visit: Payer: Self-pay | Admitting: Physical Medicine & Rehabilitation

## 2014-06-12 ENCOUNTER — Encounter: Payer: Self-pay | Admitting: Physical Medicine & Rehabilitation

## 2014-06-12 ENCOUNTER — Ambulatory Visit (HOSPITAL_BASED_OUTPATIENT_CLINIC_OR_DEPARTMENT_OTHER): Payer: Medicare Other | Admitting: Physical Medicine & Rehabilitation

## 2014-06-12 ENCOUNTER — Encounter: Payer: Medicare Other | Attending: Physical Medicine & Rehabilitation

## 2014-06-12 VITALS — BP 117/54 | HR 97 | Resp 14 | Wt 188.6 lb

## 2014-06-12 DIAGNOSIS — G8929 Other chronic pain: Secondary | ICD-10-CM

## 2014-06-12 DIAGNOSIS — IMO0002 Reserved for concepts with insufficient information to code with codable children: Secondary | ICD-10-CM | POA: Diagnosis not present

## 2014-06-12 DIAGNOSIS — M545 Low back pain, unspecified: Secondary | ICD-10-CM | POA: Diagnosis present

## 2014-06-12 DIAGNOSIS — M47817 Spondylosis without myelopathy or radiculopathy, lumbosacral region: Secondary | ICD-10-CM | POA: Insufficient documentation

## 2014-06-12 DIAGNOSIS — I10 Essential (primary) hypertension: Secondary | ICD-10-CM | POA: Diagnosis not present

## 2014-06-12 DIAGNOSIS — M1610 Unilateral primary osteoarthritis, unspecified hip: Secondary | ICD-10-CM

## 2014-06-12 DIAGNOSIS — M48061 Spinal stenosis, lumbar region without neurogenic claudication: Secondary | ICD-10-CM

## 2014-06-12 DIAGNOSIS — M160 Bilateral primary osteoarthritis of hip: Secondary | ICD-10-CM

## 2014-06-12 MED ORDER — OXYCODONE HCL 15 MG PO TABS: 15.0000 mg | ORAL_TABLET | Freq: Three times a day (TID) | ORAL | Status: DC | PRN

## 2014-06-12 NOTE — Progress Notes (Signed)
Subjective:    Patient ID: Kari Martinez, female    DOB: [birth year], 80 y.o.   MRN: [id number] Chief complaint is left knee pain, no falls or trauma to that area HPI Left hip still doing ok after intra articular injection on April 24, 2014 Low back still painful in the sacral area. No pain shooting down the legs.  No medication side effects reported Taking oxycodone 15 mg 3 times per day. Pill counts are good. Continues on tramadol 50 mg 4 times per day Continues on gabapentin 200 mg 3 times per day Pain Inventory Average Pain 6 Pain Right Now 6 My pain is intermittent, sharp, stabbing and tingling  In the last 24 hours, has pain interfered with the following? General activity 7 Relation with others 7 Enjoyment of life 9 What TIME of day is your pain at its worst? morning and daytime Sleep (in general) Fair  Pain is worse with: walking and standing Pain improves with: rest, heat/ice, medication and injections Relief from Meds: 6  Mobility use a walker how many minutes can you walk? 5-10 ability to climb steps?  yes do you drive?  yes  Function retired  Neuro/Psych trouble walking  Prior Studies Any changes since last visit?  no  Physicians involved in your care Any changes since last visit?  no   Family History  Problem Relation Age of Onset  . Heart disease Father    History   Social History  . Marital Status: Widowed    Spouse Name: N/A    Number of Children: N/A  . Years of Education: N/A   Social History Main Topics  . Smoking status: Never Smoker   . Smokeless tobacco: Never Used  . Alcohol Use: No  . Drug Use: No  . Sexual Activity: None   Other Topics Concern  . None   Social History Narrative  . None   Past Surgical History  Procedure Laterality Date  . Breast lumpectomy    . Abdominal hysterectomy      Ovaries intact  . Thyroid surgery      Partial, left lobe  . Knee surgery      left torn meniscus  . Cholecystectomy    .  Tonsillectomy    . Carpal tunnel release      right  . Lump in breast      Left Benign  . Gallbladder surgery    . Meniscus repair    . Radiofrequency ablation nerves      by Dr. Letta Pate  . Cataract extraction, bilateral      and lens implants Dr. Katy Fitch 2010  . Cholecystectomy    . Dilation and curettage of uterus     Past Medical History  Diagnosis Date  . Hypertension   . Arthritis   . Spinal stenosis     lumbar, bulging disc, receiving injections 01-01-2011,02-12-2011, Dr. Letta Pate (pain management)  . Left humeral fracture Category 01/23/2012  . Osteoarthritis   . Hyperlipidemia   . Thyroid disease     Hypo  . Osteopenia   . Chronic renal insufficiency, stage III (moderate)   . Nephrosclerosis     prn, Dr. Moshe Cipro  . Gout     Dr. Ouida Sills Rheumatologist   BP 117/54  Pulse 97  Resp 14  Wt 188 lb 9.6 oz (85.548 kg)  SpO2 94%  Opioid Risk Score:   Fall Risk Score: Moderate Fall Risk (6-13 points) (previoulsy educated and given handout)  Review of Systems  Musculoskeletal: Positive for gait problem.  All other systems reviewed and are negative.      Objective:   Physical Exam Decreased right hip range of motion, mild with internal and external rotation Markedly diminished left hip range of motion with essentially no internal rotation Medial lateral instability left knee with pain, no evidence of knee effusion, no evidence of erythema. No tenderness to palpation.  Motor strength is 5/5 bilateral knee extension ankle dorsiflexion, 4/5 bilateral hip flexion Negative straight leg raising  Lumbar spine is no tenderness to palpation does have some mild tenderness over the mid point of the sacrum. Hips have mild tenderness to palpation left greater than right greater trochanter's.  Ambulates with a walker no evidence of toe drag. Does have a forward flexed posture       Assessment & Plan:  #1. Lumbar spinal stenosis no radicular symptoms at the current  time. No evidence of neurogenic claudication however does walked with a forward flexed posture. We discussed that this is likely a compensatory strategy to reduce pain related to lumbar stenosis. She may benefit from PT for gait training however she does not wish to pursue this at the current time. Chronic pain related to above Continue oxycodone 15 mg 3 times a day as well as gabapentin 200 mg 3 times a day 2. Left hip arthritis responding well to intra-articular steroid injection under fluoroscopic guidance may need to repeat in 6-8 weeks #3. Left knee osteoarthritis continue goal Santiago Glad gel as well as oxycodone. If this progresses may need left knee injection  Discussed above with patient as well as her daughter agree with plan.

## 2014-06-12 NOTE — Patient Instructions (Addendum)
If left knee pain becomes worse, we may try and injection next month.  Consider therapy to improve walking posture

## 2014-07-10 ENCOUNTER — Encounter: Payer: Self-pay | Admitting: Physical Medicine & Rehabilitation

## 2014-07-12 ENCOUNTER — Ambulatory Visit: Payer: Medicare Other | Admitting: Physical Medicine & Rehabilitation

## 2014-07-12 ENCOUNTER — Encounter: Payer: Self-pay | Admitting: Physical Medicine & Rehabilitation

## 2014-07-12 ENCOUNTER — Ambulatory Visit: Payer: Medicare Other

## 2014-07-16 ENCOUNTER — Encounter: Payer: Self-pay | Admitting: Registered Nurse

## 2014-07-16 ENCOUNTER — Encounter: Payer: Medicare Other | Attending: Physical Medicine & Rehabilitation | Admitting: Registered Nurse

## 2014-07-16 VITALS — BP 167/75 | HR 94 | Resp 14 | Ht 62.0 in | Wt 190.0 lb

## 2014-07-16 DIAGNOSIS — M4120 Other idiopathic scoliosis, site unspecified: Secondary | ICD-10-CM

## 2014-07-16 DIAGNOSIS — Z5181 Encounter for therapeutic drug level monitoring: Secondary | ICD-10-CM | POA: Diagnosis present

## 2014-07-16 DIAGNOSIS — G894 Chronic pain syndrome: Secondary | ICD-10-CM | POA: Diagnosis present

## 2014-07-16 DIAGNOSIS — M48061 Spinal stenosis, lumbar region without neurogenic claudication: Secondary | ICD-10-CM

## 2014-07-16 DIAGNOSIS — M4806 Spinal stenosis, lumbar region: Secondary | ICD-10-CM

## 2014-07-16 DIAGNOSIS — M419 Scoliosis, unspecified: Secondary | ICD-10-CM

## 2014-07-16 DIAGNOSIS — Z79899 Other long term (current) drug therapy: Secondary | ICD-10-CM | POA: Diagnosis not present

## 2014-07-16 DIAGNOSIS — M160 Bilateral primary osteoarthritis of hip: Secondary | ICD-10-CM

## 2014-07-16 MED ORDER — TRAMADOL HCL 50 MG PO TABS: 50.0000 mg | ORAL_TABLET | Freq: Four times a day (QID) | ORAL | Status: DC

## 2014-07-16 MED ORDER — OXYCODONE HCL 15 MG PO TABS: 15.0000 mg | ORAL_TABLET | Freq: Three times a day (TID) | ORAL | Status: DC | PRN

## 2014-07-16 NOTE — Progress Notes (Signed)
Subjective:    Patient ID: Kari Martinez, female    DOB: 06-22-1924, 80 y.o.   MRN: [id number]  HPI: Kari Martinez is a 80 year old female who returns for follow up for chronic pain and medication refill. She says her pain is located in her lower back and right knee. She rates her pain 8. He current exercise regime is chair exercises. She is scheduled for left knee injection with Dr. Letta Pate on 07/24/14.  Pain Inventory Average Pain 7 Pain Right Now 8 My pain is constant, sharp and aching  In the last 24 hours, has pain interfered with the following? General activity 7 Relation with others 9 Enjoyment of life 9 What TIME of day is your pain at its worst? morning and daytime Sleep (in general) Fair  Pain is worse with: walking and standing Pain improves with: medication and injections Relief from Meds: 7  Mobility use a walker how many minutes can you walk? 5 to 10 ability to climb steps?  yes do you drive?  yes  Function retired  Neuro/Psych weakness trouble walking  Prior Studies Any changes since last visit?  no  Physicians involved in your care Any changes since last visit?  no   Family History  Problem Relation Age of Onset  . Heart disease Father    History   Social History  . Marital Status: Widowed    Spouse Name: N/A    Number of Children: N/A  . Years of Education: N/A   Social History Main Topics  . Smoking status: Never Smoker   . Smokeless tobacco: Never Used  . Alcohol Use: No  . Drug Use: No  . Sexual Activity: None   Other Topics Concern  . None   Social History Narrative  . None   Past Surgical History  Procedure Laterality Date  . Breast lumpectomy    . Abdominal hysterectomy      Ovaries intact  . Thyroid surgery      Partial, left lobe  . Knee surgery      left torn meniscus  . Cholecystectomy    . Tonsillectomy    . Carpal tunnel release      right  . Lump in breast      Left Benign  . Gallbladder surgery      . Meniscus repair    . Radiofrequency ablation nerves      by Dr. Letta Pate  . Cataract extraction, bilateral      and lens implants Dr. Katy Fitch 2010  . Cholecystectomy    . Dilation and curettage of uterus     Past Medical History  Diagnosis Date  . Hypertension   . Arthritis   . Spinal stenosis     lumbar, bulging disc, receiving injections 01-01-2011,02-12-2011, Dr. Letta Pate (pain management)  . Left humeral fracture Category 01/23/2012  . Osteoarthritis   . Hyperlipidemia   . Thyroid disease     Hypo  . Osteopenia   . Chronic renal insufficiency, stage III (moderate)   . Nephrosclerosis     prn, Dr. Moshe Cipro  . Gout     Dr. Ouida Sills Rheumatologist   BP 167/75  Pulse 94  Resp 14  Ht 5\' 2"  (1.575 m)  Wt 190 lb (86.183 kg)  BMI 34.74 kg/m2  SpO2 99%  Opioid Risk Score:   Fall Risk Score: Moderate Fall Risk (6-13 points)   Review of Systems     Objective:   Physical Exam  Nursing  note and vitals reviewed. Constitutional: She is oriented to person, place, and time. She appears well-developed and well-nourished.  HENT:  Head: Normocephalic and atraumatic.  Neck: Normal range of motion. Neck supple.  Cardiovascular: Normal rate and regular rhythm.   Pulmonary/Chest: Effort normal and breath sounds normal.  Musculoskeletal:  Normal Muscle Bulk and Muscle testing Reveals:  Upper Extremities: Right Full ROM and Muscle Strength 5/5. Left Arm Decreased ROM 45 Degrees and Muscle Strength 4/5 Lumbar Paraspinal Tenderness: L-3- L-5 Lower Extremities: Full ROM and Muscle Strength 5/5 Left Leg Flexion produces pain into Patella No swelling or Tenderness Noted Arises from chair with ease/ Using the walker for assistance   Neurological: She is alert and oriented to person, place, and time.  Skin: Skin is warm and dry.  Psychiatric: She has a normal mood and affect.          Assessment & Plan:  1. Lumbar spinal stenosis with right L3-4 radiculitis sensory only:  Encouraged to increase activity and use heat therapy.  2. Left greater than right hip osteoarthritis: Steroid injection  with Dr. Letta Pate on 07/24/14. #3. Chronic pain syndrome: Good Relief with current medication regime  Refilled: Oxycodone 15 mg one tablet 3 times a day #90 and Tramadol 50 mg 1 tablet 4 times a day #120.   20 minutes of face to face patient care time was spent during this visit. All questions were encouraged and answered.   F/u in 1 month

## 2014-07-24 ENCOUNTER — Ambulatory Visit: Payer: Medicare Other | Admitting: Physical Medicine & Rehabilitation

## 2014-07-24 ENCOUNTER — Ambulatory Visit (HOSPITAL_BASED_OUTPATIENT_CLINIC_OR_DEPARTMENT_OTHER): Payer: Medicare Other | Admitting: Physical Medicine & Rehabilitation

## 2014-07-24 ENCOUNTER — Encounter: Payer: Self-pay | Admitting: Physical Medicine & Rehabilitation

## 2014-07-24 VITALS — BP 156/74 | HR 97 | Resp 14 | Wt 191.0 lb

## 2014-07-24 DIAGNOSIS — Z79899 Other long term (current) drug therapy: Secondary | ICD-10-CM | POA: Diagnosis not present

## 2014-07-24 DIAGNOSIS — M25562 Pain in left knee: Secondary | ICD-10-CM

## 2014-07-24 NOTE — Progress Notes (Signed)
Knee injection LEFT  Indication:LEFT Knee pain not relieved by medication management and other conservative care.  Informed consent was obtained after describing risks and benefits of the procedure with the patient, this includes bleeding, bruising, infection and medication side effects. The patient wishes to proceed and has given written consent. The patient was placed in a recumbent position. The medial aspect of the knee was marked and prepped with Betadine and alcohol. It was then entered with a 25-gauge 1-1/2 inch needle and 1 mL of 1% lidocaine was injected into the skin and subcutaneous tissue. Then another 25g 1.5 inchneedle was inserted into the knee joint. After negative draw back for blood, a solution containing one ML of 6mg  per mL betamethasone and 3 mL of 1% lidocaine were injected. The patient tolerated the procedure well. Post procedure instructions were given.  RTC 3 wks  If no improvement in 1 wk post inject get L knee xray at [facility]

## 2014-08-10 ENCOUNTER — Other Ambulatory Visit: Payer: Self-pay | Admitting: Physical Medicine & Rehabilitation

## 2014-08-14 ENCOUNTER — Encounter: Payer: Medicare Other | Attending: Physical Medicine & Rehabilitation

## 2014-08-14 ENCOUNTER — Ambulatory Visit (HOSPITAL_BASED_OUTPATIENT_CLINIC_OR_DEPARTMENT_OTHER): Payer: Medicare Other | Admitting: Physical Medicine & Rehabilitation

## 2014-08-14 ENCOUNTER — Encounter: Payer: Self-pay | Admitting: Physical Medicine & Rehabilitation

## 2014-08-14 VITALS — BP 145/66 | HR 95 | Resp 14 | Wt 187.0 lb

## 2014-08-14 DIAGNOSIS — Z5181 Encounter for therapeutic drug level monitoring: Secondary | ICD-10-CM | POA: Diagnosis present

## 2014-08-14 DIAGNOSIS — G894 Chronic pain syndrome: Secondary | ICD-10-CM | POA: Diagnosis present

## 2014-08-14 DIAGNOSIS — Z79899 Other long term (current) drug therapy: Secondary | ICD-10-CM | POA: Diagnosis present

## 2014-08-14 DIAGNOSIS — M160 Bilateral primary osteoarthritis of hip: Secondary | ICD-10-CM

## 2014-08-14 DIAGNOSIS — M1712 Unilateral primary osteoarthritis, left knee: Secondary | ICD-10-CM

## 2014-08-14 MED ORDER — OXYCODONE HCL 15 MG PO TABS: 15.0000 mg | ORAL_TABLET | Freq: Three times a day (TID) | ORAL | Status: DC | PRN

## 2014-08-14 NOTE — Progress Notes (Signed)
Subjective:    Patient ID: Kari Martinez, female    DOB: 04-13-1924, 80 y.o.   MRN: [id number]  HPI Left knee better since October 20 left knee injection. Left groin painMainly with ambulation however this is not severe at this point.  Ambulates with a walker. Independent with her bathing and dressing. Lives alone Daughter drives her    Pain Inventory Average Pain 5 Pain Right Now 6 My pain is intermittent, dull and stabbing  In the last 24 hours, has pain interfered with the following? General activity 5 Relation with others 5 Enjoyment of life 7 What TIME of day is your pain at its worst? daytime Sleep (in general) Fair  Pain is worse with: walking and standing Pain improves with: rest and medication Relief from Meds: 6  Mobility use a walker ability to climb steps?  yes do you drive?  yes  Function retired I need assistance with the following:  household duties and shopping  Neuro/Psych trouble walking  Prior Studies Any changes since last visit?  yes  Physicians involved in your care Any changes since last visit?  no   Family History  Problem Relation Age of Onset  . Heart disease Father    History   Social History  . Marital Status: Widowed    Spouse Name: N/A    Number of Children: N/A  . Years of Education: N/A   Social History Main Topics  . Smoking status: Never Smoker   . Smokeless tobacco: Never Used  . Alcohol Use: No  . Drug Use: No  . Sexual Activity: None   Other Topics Concern  . None   Social History Narrative   Past Surgical History  Procedure Laterality Date  . Breast lumpectomy    . Abdominal hysterectomy      Ovaries intact  . Thyroid surgery      Partial, left lobe  . Knee surgery      left torn meniscus  . Cholecystectomy    . Tonsillectomy    . Carpal tunnel release      right  . Lump in breast      Left Benign  . Gallbladder surgery    . Meniscus repair    . Radiofrequency ablation nerves      by Dr.  Letta Pate  . Cataract extraction, bilateral      and lens implants Dr. Katy Fitch 2010  . Cholecystectomy    . Dilation and curettage of uterus     Past Medical History  Diagnosis Date  . Hypertension   . Arthritis   . Spinal stenosis     lumbar, bulging disc, receiving injections 01-01-2011,02-12-2011, Dr. Letta Pate (pain management)  . Left humeral fracture Category 01/23/2012  . Osteoarthritis   . Hyperlipidemia   . Thyroid disease     Hypo  . Osteopenia   . Chronic renal insufficiency, stage III (moderate)   . Nephrosclerosis     prn, Dr. Moshe Cipro  . Gout     Dr. Ouida Sills Rheumatologist   BP 145/66 mmHg  Pulse 95  Resp 14  Wt 187 lb (84.823 kg)  SpO2 95%  Opioid Risk Score:   Fall Risk Score: Moderate Fall Risk (6-13 points) (previoulsy educated and given handout) Review of Systems  Respiratory: Positive for shortness of breath.   All other systems reviewed and are negative.      Objective:   Physical Exam  Constitutional: She is oriented to person, place, and time. She appears well-developed.  obese  HENT:  Head: Normocephalic and atraumatic.  Eyes: Conjunctivae and EOM are normal. Pupils are equal, round, and reactive to light.  Musculoskeletal:       Left hip: She exhibits decreased range of motion and decreased strength. She exhibits no tenderness.       Left knee: She exhibits normal range of motion. No tenderness found.  Neurological: She is alert and oriented to person, place, and time.  Psychiatric: She has a normal mood and affect.  Right and alert  Nursing note and vitals reviewed.         Assessment & Plan:  1. Left knee osteoarthritis relieved after Palpation guidedleft knee injection. We discussed that if injection wears off it can be repeated at the end of January. We also discussed Synvisc, Visco supplementation with the patient as was her daughter.  2.Left hip osteoarthritis last injection 04/24/2014. Starting to get groin pain with  ambulation. We discussed this can be repeated under fluoroscopic guidance as needed.  In terms of her pain medicine she is on a stable dose. She's had no untoward side effects such as constipation, falls or mental status changes. Continue current regimen  Oxycodone 15 mg 3 times a day Tramadol 50 mg 4 times a day  Nurse practitioner visit next month M.D. Visit in 2 months possible hip or knee injection

## 2014-08-14 NOTE — Patient Instructions (Signed)
Left hip injection can be repeated anytime  Left knee injection do not repeat prior to Jan 20th, 2016.  Hylan G-F 20 intra-articular injection What is this medicine? HYLAN G-F 20 (HI lan G F 20) is used to treat osteoarthritis of the knee. It lubricates and cushions the joint, reducing pain in the knee. This medicine may be used for other purposes; ask your health care provider or pharmacist if you have questions. COMMON BRAND NAME(S): Synvisc, Synvisc-One What should I tell my health care provider before I take this medicine? They need to know if you have any of these conditions: -severe knee inflammation -skin conditions or sensitivity -skin or joint infection -venous stasis -an unusual or allergic reaction to hylan G-F 20, hyaluronan (sodium hyaluronate), eggs, other medicines, foods, dyes, or preservatives -pregnant or trying to get pregnant -breast-feeding How should I use this medicine? This medicine is for injection into the knee joint. It is given by a health care professional in a hospital or clinic setting. Talk to your pediatrician regarding the use of this medicine in children. This medicine is not approved for use in children. Overdosage: If you think you've taken too much of this medicine contact a poison control center or emergency room at once. Overdosage: If you think you have taken too much of this medicine contact a poison control center or emergency room at once. NOTE: This medicine is only for you. Do not share this medicine with others. What if I miss a dose? Keep appointments for follow-up doses as directed. For Synvisc, you will need weekly injections for 3 doses. It is important not to miss your dose. If you will receive Synvisc-One, then only 1 injection will be needed. Call your doctor or health care professional if you are unable to keep an appointment. What may interact with this medicine? Do not take this medicine with any of the following medications: -other  injections for the joint like steroids or anesthetics -certain skin disinfectants like benzalkonium chloride This list may not describe all possible interactions. Give your health care provider a list of all the medicines, herbs, non-prescription drugs, or dietary supplements you use. Also tell them if you smoke, drink alcohol, or use illegal drugs. Some items may interact with your medicine. What should I watch for while using this medicine? Tell your doctor or healthcare professional if your symptoms do not start to get better or if they get worse. Your condition will be monitored carefully while you are receiving this medicine. Most persons get pain relief for up to 6 months after treatment. Avoid strenuous activities (high-impact sports, jogging) or major weight-bearing activities for 48 hours after the injection. What side effects may I notice from receiving this medicine? Side effects that you should report to your doctor or health care professional as soon as possible: -allergic reactions like skin rash, itching or hives, swelling of the face, lips, or tongue -difficulty breathing -fever or chills -severe joint pain or swelling -unusual bleeding or bruising Side effects that usually do not require medical attention (Report these to your doctor or health care professional if they continue or are bothersome.): -dizziness -flushing -general ill feeling or flu-like symptoms -headache -minor joint pain or swelling -muscle pain or cramps -pain, redness, irritation or bruising at site of injection This list may not describe all possible side effects. Call your doctor for medical advice about side effects. You may report side effects to FDA at 1-800-FDA-1088. Where should I keep my medicine? This drug is given  in a hospital or clinic and will not be stored at home. NOTE: This sheet is a summary. It may not cover all possible information. If you have questions about this medicine, talk to your  doctor, pharmacist, or health care provider.  2015, Elsevier/Gold Standard. (2010-05-08 16:39:59)

## 2014-08-28 ENCOUNTER — Encounter: Payer: Self-pay | Admitting: Physical Medicine & Rehabilitation

## 2014-08-28 ENCOUNTER — Ambulatory Visit (HOSPITAL_BASED_OUTPATIENT_CLINIC_OR_DEPARTMENT_OTHER): Payer: Medicare Other | Admitting: Physical Medicine & Rehabilitation

## 2014-08-28 VITALS — BP 148/62 | HR 88

## 2014-08-28 DIAGNOSIS — M1612 Unilateral primary osteoarthritis, left hip: Secondary | ICD-10-CM

## 2014-08-28 DIAGNOSIS — Z79899 Other long term (current) drug therapy: Secondary | ICD-10-CM | POA: Diagnosis not present

## 2014-08-28 NOTE — Progress Notes (Signed)
  PROCEDURE RECORD [facility]   Name: Kari Martinez DOB:1924-09-13 MRN: [id number]  Date:08/28/2014  Physician: Alysia Penna, MD    Nurse/CMA:  MaryBeth Ginkel   Allergies:  Allergies  Allergen Reactions  . Alendronate Sodium Other (See Comments)    Throat swelling  . Ketorolac Other (See Comments)    "Retained fluid in lungs"  . Ketorolac Tromethamine Other (See Comments)    FLUID RETENTION  . Mobic [Meloxicam]     Kidney decline  . Nsaids Other (See Comments)    Elevated kidney function  . Risedronate Sodium Other (See Comments)    Throat swelling and mouth swelling  . Septra [Sulfamethoxazole-Trimethoprim]     Rash  . Vioxx [Rofecoxib]     Kidney decline  . Allopurinol Rash  . Sulfa Antibiotics Rash    Consent Signed: Yes.    Is patient diabetic? No.  CBG today?   Pregnant: No. LMP: No LMP recorded. Patient is postmenopausal. (age 87-55)  Anticoagulants: no Anti-inflammatory: no Antibiotics: no  Procedure:  Major Joint - Left Hip Position: Supine Start Time: 1:25pm  End Time:  Fluoro Time:   RN/CMA MaryBeth Ginkel MaryBeth Ginkel    Time  1:30    BP  166/68    Pulse  78    Respirations 14 14    O2 Sat 98 94    S/S 6 6    Pain Level 8/10  7/10     D/C home with  Grand daughter, patient A & O X 3, D/C instructions reviewed, and sits independently.

## 2014-08-28 NOTE — Patient Instructions (Signed)
Joint Injection  Care After  Refer to this sheet in the next few days. These instructions provide you with information on caring for yourself after you have had a joint injection. Your caregiver also may give you more specific instructions. Your treatment has been planned according to current medical practices, but problems sometimes occur. Call your caregiver if you have any problems or questions after your procedure.  After any type of joint injection, it is not uncommon to experience:  · Soreness, swelling, or bruising around the injection site.  · Mild numbness, tingling, or weakness around the injection site caused by the numbing medicine used before or with the injection.  It also is possible to experience the following effects associated with the specific agent after injection:  · Iodine-based contrast agents:  ¨ Allergic reaction (itching, hives, widespread redness, and swelling beyond the injection site).  · Corticosteroids (These effects are rare.):  ¨ Allergic reaction.  ¨ Increased blood sugar levels (If you have diabetes and you notice that your blood sugar levels have increased, notify your caregiver).  ¨ Increased blood pressure levels.  ¨ Mood swings.  · Hyaluronic acid in the use of viscosupplementation.  ¨ Temporary heat or redness.  ¨ Temporary rash and itching.  ¨ Increased fluid accumulation in the injected joint.  These effects all should resolve within a day after your procedure.   HOME CARE INSTRUCTIONS  · Limit yourself to light activity the day of your procedure. Avoid lifting heavy objects, bending, stooping, or twisting.  · Take prescription or over-the-counter pain medication as directed by your caregiver.  · You may apply ice to your injection site to reduce pain and swelling the day of your procedure. Ice may be applied 03-04 times:  ¨ Put ice in a plastic bag.  ¨ Place a towel between your skin and the bag.  ¨ Leave the ice on for no longer than 15-20 minutes each time.  SEEK  IMMEDIATE MEDICAL CARE IF:   · Pain and swelling get worse rather than better or extend beyond the injection site.  · Numbness does not go away.  · Blood or fluid continues to leak from the injection site.  · You have chest pain.  · You have swelling of your face or tongue.  · You have trouble breathing or you become dizzy.  · You develop a fever, chills, or severe tenderness at the injection site that last longer than 1 day.  MAKE SURE YOU:  · Understand these instructions.  · Watch your condition.  · Get help right away if you are not doing well or if you get worse.  Document Released: 06/04/2011 Document Revised: 12/14/2011 Document Reviewed: 06/04/2011  ExitCare® Patient Information ©2015 [employer]. This information is not intended to replace advice given to you by your health care provider. Make sure you discuss any questions you have with your health care provider.

## 2014-08-28 NOTE — Progress Notes (Signed)
Aspiration/Injection Procedure Note Kari Martinez [id number] 13-Aug-1924  Procedure: Injection and Left hip under Fluorscopic guidance Indications: End stage OA Left hip  Procedure Details Consent: Risks of procedure as well as the alternatives and risks of each were explained to the (patient/caregiver).  Consent for procedure obtained. Time Out: Verified patient identification, verified procedure, site/side was marked, verified correct patient position, special equipment/implants available, medications/allergies/relevent history reviewed, required imaging and test results available.  Performed   Local Anesthesia Used:Lidocaine 1% plain; 15mL Amount of Fluid Aspirated: minimal amount Character of Fluid: clear  Under fluoroscopic guidance 22-gauge 3.5 inch spinal needle was directed into the right hip joint targeting the left femoral neck midpoint at junction with femoral head. Once target was reached and bone contact was established, Omnipaque 180 2 mL demonstrated good joint outline anda solution containing 1 cc of 6 mg/cc Celestone +4 cc of 1% lidocaine were injected. Patient tolerated procedure well.   Estimated blood loss: nil  KIRSTEINS,ANDREW E 08/28/2014, 2:03 PM

## 2014-09-12 ENCOUNTER — Encounter: Payer: Medicare Other | Attending: Physical Medicine & Rehabilitation | Admitting: Registered Nurse

## 2014-09-12 ENCOUNTER — Encounter: Payer: Self-pay | Admitting: Registered Nurse

## 2014-09-12 DIAGNOSIS — Z5181 Encounter for therapeutic drug level monitoring: Secondary | ICD-10-CM | POA: Diagnosis present

## 2014-09-12 DIAGNOSIS — M4120 Other idiopathic scoliosis, site unspecified: Secondary | ICD-10-CM

## 2014-09-12 DIAGNOSIS — M1712 Unilateral primary osteoarthritis, left knee: Secondary | ICD-10-CM

## 2014-09-12 DIAGNOSIS — M160 Bilateral primary osteoarthritis of hip: Secondary | ICD-10-CM

## 2014-09-12 DIAGNOSIS — Z79899 Other long term (current) drug therapy: Secondary | ICD-10-CM

## 2014-09-12 DIAGNOSIS — M419 Scoliosis, unspecified: Secondary | ICD-10-CM

## 2014-09-12 DIAGNOSIS — G894 Chronic pain syndrome: Secondary | ICD-10-CM | POA: Diagnosis present

## 2014-09-12 MED ORDER — OXYCODONE HCL 15 MG PO TABS: 15.0000 mg | ORAL_TABLET | Freq: Three times a day (TID) | ORAL | Status: DC | PRN

## 2014-09-12 NOTE — Progress Notes (Signed)
Subjective:    Patient ID: Kari Martinez, female    DOB: [birth year], 80 y.o.   MRN: [id number]  HPI: Kari Martinez is a 80 year old female who returns for follow up for chronic pain and medication refill. She says her pain is located in her bilateral hips and bilateral knees. She rates her pain 6. Her current exercise regime is chair exercises. S/P Left Hip injection 4-5 days relief noted.  Pain Inventory Average Pain 6 Pain Right Now 6 My pain is intermittent  In the last 24 hours, has pain interfered with the following? General activity 6 Relation with others 6 Enjoyment of life 8 What TIME of day is your pain at its worst? morning, daytime Sleep (in general) Fair  Pain is worse with: walking and standing Pain improves with: rest, heat/ice and medication Relief from Meds: 6  Mobility walk with assistance use a walker how many minutes can you walk? 5-10 ability to climb steps?  yes do you drive?  yes  Function retired  Neuro/Psych trouble walking  Prior Studies Any changes since last visit?  no  Physicians involved in your care Any changes since last visit?  no   Family History  Problem Relation Age of Onset  . Heart disease Father    History   Social History  . Marital Status: Widowed    Spouse Name: N/A    Number of Children: N/A  . Years of Education: N/A   Social History Main Topics  . Smoking status: Never Smoker   . Smokeless tobacco: Never Used  . Alcohol Use: No  . Drug Use: No  . Sexual Activity: None   Other Topics Concern  . None   Social History Narrative   Past Surgical History  Procedure Laterality Date  . Breast lumpectomy    . Abdominal hysterectomy      Ovaries intact  . Thyroid surgery      Partial, left lobe  . Knee surgery      left torn meniscus  . Cholecystectomy    . Tonsillectomy    . Carpal tunnel release      right  . Lump in breast      Left Benign  . Gallbladder surgery    . Meniscus repair    .  Radiofrequency ablation nerves      by Dr. Letta Pate  . Cataract extraction, bilateral      and lens implants Dr. Katy Fitch 2010  . Cholecystectomy    . Dilation and curettage of uterus     Past Medical History  Diagnosis Date  . Hypertension   . Arthritis   . Spinal stenosis     lumbar, bulging disc, receiving injections 01-01-2011,02-12-2011, Dr. Letta Pate (pain management)  . Left humeral fracture Category 01/23/2012  . Osteoarthritis   . Hyperlipidemia   . Thyroid disease     Hypo  . Osteopenia   . Chronic renal insufficiency, stage III (moderate)   . Nephrosclerosis     prn, Dr. Moshe Cipro  . Gout     Dr. Ouida Sills Rheumatologist   BP 162/61 mmHg  Pulse 103  Resp 14  Ht 5\' 3"  (1.6 m)  Wt 186 lb (84.369 kg)  BMI 32.96 kg/m2  SpO2 96%  Opioid Risk Score:   Fall Risk Score: Moderate Fall Risk (6-13 points) (pt has rec'd pamphlet during previous visit) Review of Systems  Musculoskeletal: Positive for gait problem.  All other systems reviewed and are negative.  Objective:   Physical Exam  Constitutional: She is oriented to person, place, and time. She appears well-developed and well-nourished.  HENT:  Head: Normocephalic and atraumatic.  Neck: Normal range of motion. Neck supple.  Cardiovascular: Normal rate and regular rhythm.   Pulmonary/Chest: Effort normal and breath sounds normal.  Musculoskeletal:  Normal Muscle Bulk and Muscle Testing Reveals: Upper Extremities: Right Full ROM and Muscle strength 5/5 Left With Decreased ROM 45 Degrees and Muscle strength 5/5 Back without spinal or paraspinal tenderness Left Greater Trochanteric Tenderness Lower Extremities: Full ROM and Muscle Strength 5/5 Left Leg Flexion Produces pain into thigh Arises from chair with ease/ Using walker for suppport Narrow Based Gait  Neurological: She is alert and oriented to person, place, and time.  Skin: Skin is warm and dry.  Psychiatric: She has a normal mood and affect.    Nursing note and vitals reviewed.         Assessment & Plan:  1. Lumbar spinal stenosis with right L3-4 radiculitis sensory only: Encouraged to increase activity and use heat therapy.  2. Left greater than right hip osteoarthritis: S/P Left Hip injectionrelief 4-5 days. #3. Chronic pain syndrome: Good Relief with current medication regime  Refilled: Oxycodone 15 mg one tablet 3 times a day #90 and Tramadol 50 mg 1 tablet 4 times a day #120.   20 minutes of face to face patient care time was spent during this visit. All questions were encouraged and answered.   F/u in 1 month

## 2014-09-25 ENCOUNTER — Other Ambulatory Visit: Payer: Self-pay | Admitting: Physical Medicine & Rehabilitation

## 2014-10-12 ENCOUNTER — Encounter: Payer: Self-pay | Admitting: Physical Medicine & Rehabilitation

## 2014-10-12 ENCOUNTER — Other Ambulatory Visit: Payer: Self-pay | Admitting: Physical Medicine & Rehabilitation

## 2014-10-12 ENCOUNTER — Ambulatory Visit: Payer: PPO | Admitting: Physical Medicine & Rehabilitation

## 2014-10-12 ENCOUNTER — Encounter: Payer: PPO | Attending: Physical Medicine & Rehabilitation

## 2014-10-12 VITALS — BP 134/60 | HR 93 | Resp 14

## 2014-10-12 DIAGNOSIS — M1712 Unilateral primary osteoarthritis, left knee: Secondary | ICD-10-CM | POA: Diagnosis not present

## 2014-10-12 DIAGNOSIS — G894 Chronic pain syndrome: Secondary | ICD-10-CM

## 2014-10-12 DIAGNOSIS — Z5181 Encounter for therapeutic drug level monitoring: Secondary | ICD-10-CM

## 2014-10-12 DIAGNOSIS — Z79899 Other long term (current) drug therapy: Secondary | ICD-10-CM | POA: Diagnosis present

## 2014-10-12 MED ORDER — OXYCODONE HCL 15 MG PO TABS: 15.0000 mg | ORAL_TABLET | Freq: Three times a day (TID) | ORAL | Status: DC | PRN

## 2014-10-12 NOTE — Patient Instructions (Signed)
Hylan G-F 20 intra-articular injection What is this medicine? HYLAN G-F 20 (HI lan G F 20) is used to treat osteoarthritis of the knee. It lubricates and cushions the joint, reducing pain in the knee. This medicine may be used for other purposes; ask your health care provider or pharmacist if you have questions. COMMON BRAND NAME(S): Synvisc, Synvisc-One What should I tell my health care provider before I take this medicine? They need to know if you have any of these conditions: -severe knee inflammation -skin conditions or sensitivity -skin or joint infection -venous stasis -an unusual or allergic reaction to hylan G-F 20, hyaluronan (sodium hyaluronate), eggs, other medicines, foods, dyes, or preservatives -pregnant or trying to get pregnant -breast-feeding How should I use this medicine? This medicine is for injection into the knee joint. It is given by a health care professional in a hospital or clinic setting. Talk to your pediatrician regarding the use of this medicine in children. This medicine is not approved for use in children. Overdosage: If you think you've taken too much of this medicine contact a poison control center or emergency room at once. Overdosage: If you think you have taken too much of this medicine contact a poison control center or emergency room at once. NOTE: This medicine is only for you. Do not share this medicine with others. What if I miss a dose? Keep appointments for follow-up doses as directed. For Synvisc, you will need weekly injections for 3 doses. It is important not to miss your dose. If you will receive Synvisc-One, then only 1 injection will be needed. Call your doctor or health care professional if you are unable to keep an appointment. What may interact with this medicine? Do not take this medicine with any of the following medications: -other injections for the joint like steroids or anesthetics -certain skin disinfectants like benzalkonium  chloride This list may not describe all possible interactions. Give your health care provider a list of all the medicines, herbs, non-prescription drugs, or dietary supplements you use. Also tell them if you smoke, drink alcohol, or use illegal drugs. Some items may interact with your medicine. What should I watch for while using this medicine? Tell your doctor or healthcare professional if your symptoms do not start to get better or if they get worse. Your condition will be monitored carefully while you are receiving this medicine. Most persons get pain relief for up to 6 months after treatment. Avoid strenuous activities (high-impact sports, jogging) or major weight-bearing activities for 48 hours after the injection. What side effects may I notice from receiving this medicine? Side effects that you should report to your doctor or health care professional as soon as possible: -allergic reactions like skin rash, itching or hives, swelling of the face, lips, or tongue -difficulty breathing -fever or chills -severe joint pain or swelling -unusual bleeding or bruising Side effects that usually do not require medical attention (Report these to your doctor or health care professional if they continue or are bothersome.): -dizziness -flushing -general ill feeling or flu-like symptoms -headache -minor joint pain or swelling -muscle pain or cramps -pain, redness, irritation or bruising at site of injection This list may not describe all possible side effects. Call your doctor for medical advice about side effects. You may report side effects to FDA at 1-800-FDA-1088. Where should I keep my medicine? This drug is given in a hospital or clinic and will not be stored at home. NOTE: This sheet is a summary. It may   not cover all possible information. If you have questions about this medicine, talk to your doctor, pharmacist, or health care provider.  2015, [employer] Standard. (2010-05-08  16:39:59)  

## 2014-10-12 NOTE — Progress Notes (Signed)
Subjective:    Patient ID: Kari Martinez, female    DOB: 23-Nov-1923, 81 y.o.   MRN: [id number]  HPI  Pain Inventory Average Pain 5 Pain Right Now 5 My pain is intermittent, sharp, stabbing and aching  In the last 24 hours, has pain interfered with the following? General activity 7 Relation with others 7 Enjoyment of life 8 What TIME of day is your pain at its worst? morning and daytime Sleep (in general) Fair  Pain is worse with: walking and standing Pain improves with: rest and medication Relief from Meds: 4  Mobility use a walker ability to climb steps?  no do you drive?  no  Function retired  Neuro/Psych trouble walking  Prior Studies Any changes since last visit?  no  Physicians involved in your care Any changes since last visit?  no   Family History  Problem Relation Age of Onset  . Heart disease Father    History   Social History  . Marital Status: Widowed    Spouse Name: N/A    Number of Children: N/A  . Years of Education: N/A   Social History Main Topics  . Smoking status: Never Smoker   . Smokeless tobacco: Never Used  . Alcohol Use: No  . Drug Use: No  . Sexual Activity: None   Other Topics Concern  . None   Social History Narrative   Past Surgical History  Procedure Laterality Date  . Breast lumpectomy    . Abdominal hysterectomy      Ovaries intact  . Thyroid surgery      Partial, left lobe  . Knee surgery      left torn meniscus  . Cholecystectomy    . Tonsillectomy    . Carpal tunnel release      right  . Lump in breast      Left Benign  . Gallbladder surgery    . Meniscus repair    . Radiofrequency ablation nerves      by Dr. Letta Pate  . Cataract extraction, bilateral      and lens implants Dr. Katy Fitch 2010  . Cholecystectomy    . Dilation and curettage of uterus     Past Medical History  Diagnosis Date  . Hypertension   . Arthritis   . Spinal stenosis     lumbar, bulging disc, receiving injections  01-01-2011,02-12-2011, Dr. Letta Pate (pain management)  . Left humeral fracture Category 01/23/2012  . Osteoarthritis   . Hyperlipidemia   . Thyroid disease     Hypo  . Osteopenia   . Chronic renal insufficiency, stage III (moderate)   . Nephrosclerosis     prn, Dr. Moshe Cipro  . Gout     Dr. Ouida Sills Rheumatologist   BP 134/60 mmHg  Pulse 93  Resp 14  SpO2 96%  Opioid Risk Score:   Fall Risk Score: Moderate Fall Risk (6-13 points)  Review of Systems  Constitutional: Negative.   HENT: Negative.   Eyes: Negative.   Respiratory: Negative.   Cardiovascular: Negative.   Gastrointestinal: Negative.   Endocrine: Negative.   Genitourinary: Negative.   Musculoskeletal: Positive for joint swelling and arthralgias.  Skin: Negative.   Allergic/Immunologic: Negative.   Neurological: Negative.        Trouble walking  Hematological: Negative.   Psychiatric/Behavioral: Negative.        Objective:   Physical Exam        Assessment & Plan:  Indication end-stage osteoarthritis of the Left  knee  with pain that limits mobility and does not respond to oral medications.  Ultrasound guidance, 12 Hz linear transducer, long axis view  Medial aspect of the knee was imaged, identified joint space, identified patella, femur, tibia. 25-gauge 1.5 inch needle was inserted under ultrasound guidance and 3 mL of 1% lidocaine were infiltrated into the skin and subcutaneous tissue. Then a 21-gauge, 2 inch needle was inserted along the same needle track Into the joint under direct ultrasound visualization. 0 cc of joint fluid were removed . 6 mL of Synvisc-1 were injected. Patient tolerated procedure well Post procedure instructions given

## 2014-10-13 LAB — PMP ALCOHOL METABOLITE (ETG): ETGU: NEGATIVE ng/mL

## 2014-10-16 LAB — OXYCODONE, URINE (LC/MS-MS)
Noroxycodone, Ur: 7792 ng/mL (ref <50)
OXYMORPHONE, URINE: 9454 ng/mL (ref <50)
Oxycodone, ur: 6286 ng/mL (ref <50)

## 2014-10-16 LAB — OPIATES/OPIOIDS (LC/MS-MS)
CODEINE URINE: NEGATIVE ng/mL (ref <50)
HYDROCODONE: NEGATIVE ng/mL (ref <50)
HYDROMORPHONE: NEGATIVE ng/mL (ref <50)
MORPHINE: NEGATIVE ng/mL (ref <50)
NORHYDROCODONE, UR: NEGATIVE ng/mL (ref <50)
Noroxycodone, Ur: 7792 ng/mL (ref <50)
Oxycodone, ur: 6286 ng/mL (ref <50)
Oxymorphone: 9454 ng/mL (ref <50)

## 2014-10-16 LAB — TRAMADOL, URINE
N-DESMETHYL-CIS-TRAMADOL: 3432 ng/mL (ref <100)
Tramadol, Urine: 14421 ng/mL (ref <100)

## 2014-10-17 LAB — PRESCRIPTION MONITORING PROFILE (SOLSTAS)
Amphetamine/Meth: NEGATIVE ng/mL
Barbiturate Screen, Urine: NEGATIVE ng/mL
Benzodiazepine Screen, Urine: NEGATIVE ng/mL
Buprenorphine, Urine: NEGATIVE ng/mL
CREATININE, URINE: 147.47 mg/dL (ref >20.0)
Cannabinoid Scrn, Ur: NEGATIVE ng/mL
Carisoprodol, Urine: NEGATIVE ng/mL
Cocaine Metabolites: NEGATIVE ng/mL
ECSTASY: NEGATIVE ng/mL
FENTANYL URINE: NEGATIVE ng/mL
METHADONE SCREEN, URINE: NEGATIVE ng/mL
Meperidine, Ur: NEGATIVE ng/mL
NITRITES URINE, INITIAL: NEGATIVE ug/mL
PH URINE, INITIAL: 6 pH (ref 4.5–8.9)
PROPOXYPHENE: NEGATIVE ng/mL
Tapentadol, urine: NEGATIVE ng/mL
Zolpidem, Urine: NEGATIVE ng/mL

## 2014-10-19 NOTE — Progress Notes (Signed)
Urine drug screen for this encounter is consistent for prescribed medications.   

## 2014-11-08 ENCOUNTER — Encounter: Payer: Self-pay | Admitting: Registered Nurse

## 2014-11-08 ENCOUNTER — Encounter: Payer: PPO | Attending: Physical Medicine & Rehabilitation | Admitting: Registered Nurse

## 2014-11-08 VITALS — BP 160/74 | HR 102 | Resp 14

## 2014-11-08 DIAGNOSIS — Z79899 Other long term (current) drug therapy: Secondary | ICD-10-CM | POA: Diagnosis present

## 2014-11-08 DIAGNOSIS — Z5181 Encounter for therapeutic drug level monitoring: Secondary | ICD-10-CM

## 2014-11-08 DIAGNOSIS — G894 Chronic pain syndrome: Secondary | ICD-10-CM | POA: Diagnosis present

## 2014-11-08 DIAGNOSIS — M1712 Unilateral primary osteoarthritis, left knee: Secondary | ICD-10-CM

## 2014-11-08 MED ORDER — OXYCODONE HCL 15 MG PO TABS: 15.0000 mg | ORAL_TABLET | Freq: Three times a day (TID) | ORAL | Status: DC | PRN

## 2014-11-08 NOTE — Progress Notes (Signed)
Subjective:    Patient ID: Kari Martinez, female    DOB: 1924-02-16, 81 y.o.   MRN: [id number]  HPI: [name] is a 81 year old female who returns for follow up for chronic pain and medication refill. She says her pain is located in her left thigh, left groin and left buttock. She rates her pain 5. Her current exercise regime is chair exercises. S/P left Synvisc injection with moderate relief noted. Arrived to office hypertensive and tachycardic, vitals re-checked Hr 89 blood pressure 142/66.  Pain Inventory Average Pain 6 Pain Right Now 5 My pain is intermittent, burning, tingling and aching  In the last 24 hours, has pain interfered with the following? General activity 6 Relation with others 7 Enjoyment of life 7 What TIME of day is your pain at its worst? morning and daytime Sleep (in general) Fair  Pain is worse with: walking and standing Pain improves with: rest, heat/ice and medication Relief from Meds: 5  Mobility walk without assistance use a cane how many minutes can you walk? 5-10 ability to climb steps?  yes do you drive?  yes  Function retired  Neuro/Psych tingling trouble walking  Prior Studies Any changes since last visit?  no  Physicians involved in your care Any changes since last visit?  no   Family History  Problem Relation Age of Onset  . Heart disease Father    History   Social History  . Marital Status: Widowed    Spouse Name: N/A    Number of Children: N/A  . Years of Education: N/A   Social History Main Topics  . Smoking status: Never Smoker   . Smokeless tobacco: Never Used  . Alcohol Use: No  . Drug Use: No  . Sexual Activity: None   Other Topics Concern  . None   Social History Narrative   Past Surgical History  Procedure Laterality Date  . Breast lumpectomy    . Abdominal hysterectomy      Ovaries intact  . Thyroid surgery      Partial, left lobe  . Knee surgery      left torn meniscus  . Cholecystectomy     . Tonsillectomy    . Carpal tunnel release      right  . Lump in breast      Left Benign  . Gallbladder surgery    . Meniscus repair    . Radiofrequency ablation nerves      by Dr. Letta Pate  . Cataract extraction, bilateral      and lens implants Dr. Katy Fitch 2010  . Cholecystectomy    . Dilation and curettage of uterus     Past Medical History  Diagnosis Date  . Hypertension   . Arthritis   . Spinal stenosis     lumbar, bulging disc, receiving injections 01-01-2011,02-12-2011, Dr. Letta Pate (pain management)  . Left humeral fracture Category 01/23/2012  . Osteoarthritis   . Hyperlipidemia   . Thyroid disease     Hypo  . Osteopenia   . Chronic renal insufficiency, stage III (moderate)   . Nephrosclerosis     prn, Dr. Moshe Cipro  . Gout     Dr. Ouida Sills Rheumatologist   BP 160/74 mmHg  Pulse 102  Resp 14  SpO2 96%  Opioid Risk Score:   Fall Risk Score: Moderate Fall Risk (6-13 points)  Review of Systems  Constitutional: Negative.   HENT: Negative.   Eyes: Negative.   Respiratory: Negative.  Cardiovascular: Negative.   Gastrointestinal: Negative.   Endocrine: Negative.   Genitourinary: Negative.   Musculoskeletal:       Pain left leg from groin to buttox  Skin: Negative.   Allergic/Immunologic: Negative.   Neurological:       Tingling  Hematological: Negative.   Psychiatric/Behavioral: Negative.        Objective:   Physical Exam  Constitutional: She is oriented to person, place, and time. She appears well-developed and well-nourished.  HENT:  Head: Normocephalic and atraumatic.  Neck: Normal range of motion. Neck supple.  Cardiovascular: Normal rate and regular rhythm.   Pulmonary/Chest: Effort normal and breath sounds normal.  Musculoskeletal:  Normal Muscle Bulk and Muscle testing Reveals: Upper Extremities: Right: Full ROM and Muscle Strength 5/5 Left Decreased ROM 45 Degrees and Muscle Strength 5/5 Lumbar Paraspinal Tenderness: L-3-  L-5 Left Gluteal Maximus Tenderness with Palpation ( S1) Lower Extremities: Full ROM and Muscle Strength 5/5 Left Lower Extremity Flexion Produces pain into hamstring Arises from chair using walker  Narrow Based Gait   Neurological: She is alert and oriented to person, place, and time.  Skin: Skin is warm and dry.  Psychiatric: She has a normal mood and affect.  Nursing note and vitals reviewed.         Assessment & Plan:  1. Lumbar spinal stenosis with right L3-4 radiculitis sensory only: Encouraged to increase activity and use heat therapy.  2. Left greater than right hip osteoarthritis: Continue with exercise and voltaren gel. 3. Chronic pain syndrome: Good Relief with current medication regime  4.Left Knee Osteoarthritis: S/P Synvisc injection: Moderate relief Refilled: Oxycodone 15 mg one tablet 3 times a day #90 and Tramadol 50 mg 1 tablet 4 times a day #120.   20 minutes of face to face patient care time was spent during this visit. All questions were encouraged and answered.   F/u in 1 month

## 2014-11-15 ENCOUNTER — Other Ambulatory Visit: Payer: Self-pay | Admitting: Physical Medicine & Rehabilitation

## 2014-12-11 ENCOUNTER — Encounter: Payer: Self-pay | Admitting: Registered Nurse

## 2014-12-11 ENCOUNTER — Encounter: Payer: PPO | Attending: Physical Medicine & Rehabilitation | Admitting: Registered Nurse

## 2014-12-11 VITALS — BP 138/51 | HR 89 | Resp 14

## 2014-12-11 DIAGNOSIS — Z79899 Other long term (current) drug therapy: Secondary | ICD-10-CM | POA: Insufficient documentation

## 2014-12-11 DIAGNOSIS — M25562 Pain in left knee: Secondary | ICD-10-CM

## 2014-12-11 DIAGNOSIS — Z5181 Encounter for therapeutic drug level monitoring: Secondary | ICD-10-CM

## 2014-12-11 DIAGNOSIS — M1712 Unilateral primary osteoarthritis, left knee: Secondary | ICD-10-CM

## 2014-12-11 DIAGNOSIS — G894 Chronic pain syndrome: Secondary | ICD-10-CM | POA: Diagnosis present

## 2014-12-11 MED ORDER — OXYCODONE HCL 15 MG PO TABS: 15.0000 mg | ORAL_TABLET | Freq: Three times a day (TID) | ORAL | Status: DC | PRN

## 2014-12-11 MED ORDER — TRAMADOL HCL 50 MG PO TABS: 50.0000 mg | ORAL_TABLET | Freq: Four times a day (QID) | ORAL | Status: DC

## 2014-12-11 NOTE — Progress Notes (Signed)
Subjective:    Patient ID: Kari Martinez, female    DOB: May 23, 1924, 81 y.o.   MRN: [id number]  HPI: [name] is a 81 year old female who returns for follow up for chronic pain and medication refill. She says her pain is located in her left groin radiating into left hip anteriorly and left knee. She rates her pain 5. Her current exercise regime is chair exercises.  Pain Inventory Average Pain 5 Pain Right Now 5 My pain is intermittent, sharp, stabbing and tingling  In the last 24 hours, has pain interfered with the following? General activity 6 Relation with others 7 Enjoyment of life 8 What TIME of day is your pain at its worst? morning, daytime Sleep (in general) Fair  Pain is worse with: walking and standing Pain improves with: rest, medication and injections Relief from Meds: 7  Mobility walk with assistance use a walker how many minutes can you walk? 5-10 ability to climb steps?  yes do you drive?  yes  Function retired I need assistance with the following:  household duties and shopping  Neuro/Psych trouble walking  Prior Studies Any changes since last visit?  no  Physicians involved in your care Any changes since last visit?  no   Family History  Problem Relation Age of Onset  . Heart disease Father    History   Social History  . Marital Status: Widowed    Spouse Name: N/A  . Number of Children: N/A  . Years of Education: N/A   Social History Main Topics  . Smoking status: Never Smoker   . Smokeless tobacco: Never Used  . Alcohol Use: No  . Drug Use: No  . Sexual Activity: Not on file   Other Topics Concern  . None   Social History Narrative   Past Surgical History  Procedure Laterality Date  . Breast lumpectomy    . Abdominal hysterectomy      Ovaries intact  . Thyroid surgery      Partial, left lobe  . Knee surgery      left torn meniscus  . Cholecystectomy    . Tonsillectomy    . Carpal tunnel release      right  .  Lump in breast      Left Benign  . Gallbladder surgery    . Meniscus repair    . Radiofrequency ablation nerves      by Dr. Letta Pate  . Cataract extraction, bilateral      and lens implants Dr. Katy Fitch 2010  . Cholecystectomy    . Dilation and curettage of uterus     Past Medical History  Diagnosis Date  . Hypertension   . Arthritis   . Spinal stenosis     lumbar, bulging disc, receiving injections 01-01-2011,02-12-2011, Dr. Letta Pate (pain management)  . Left humeral fracture Category 01/23/2012  . Osteoarthritis   . Hyperlipidemia   . Thyroid disease     Hypo  . Osteopenia   . Chronic renal insufficiency, stage III (moderate)   . Nephrosclerosis     prn, Dr. Moshe Cipro  . Gout     Dr. Ouida Sills Rheumatologist   There were no vitals taken for this visit.  Opioid Risk Score:   Fall Risk Score:  (patient previously educated)  Review of Systems  Musculoskeletal: Positive for gait problem.  All other systems reviewed and are negative.      Objective:   Physical Exam  Constitutional: She is oriented to  person, place, and time. She appears well-developed and well-nourished.  HENT:  Head: Normocephalic and atraumatic.  Neck: Normal range of motion. Neck supple.  Cardiovascular: Normal rate and regular rhythm.   Pulmonary/Chest: Effort normal and breath sounds normal.  Musculoskeletal:  Normal Muscle Bulk and Muscle Testing Reveals: Upper Extremities: Right: Full ROM and Muscle strength 5/5 Left: Decreased ROM 45 Degrees and Muscle Strength 5/5 Lumbar Paraspinal Tenderness: L-3- L-5 Left Greater Trochanteric Tenderness Lower Extremities: Full ROM and Muscle Strength 5/5 Left Lower Extremity Flexion Produces Pain into Hip Arises from chair with ease/ using walker for support    Neurological: She is alert and oriented to person, place, and time.  Skin: Skin is warm and dry.  Psychiatric: She has a normal mood and affect.  Nursing note and vitals reviewed.          Assessment & Plan:  1. Lumbar spinal stenosis with right L3-4 radiculitis sensory only: Encouraged to increase activity and use heat therapy.  2. Left greater than right hip osteoarthritis: Continue with exercise and voltaren gel. 3. Chronic pain syndrome: Good Relief with current medication regime  4.Left Knee Osteoarthritis: S/P Synvisc injection 10/12/14. Refilled: Oxycodone 15 mg one tablet 3 times a day #90 and Tramadol 50 mg 1 tablet 4 times a day #120.   20 minutes of face to face patient care time was spent during this visit. All questions were encouraged and answered.   F/u in 1 month

## 2015-01-03 ENCOUNTER — Other Ambulatory Visit: Payer: Self-pay | Admitting: Physical Medicine & Rehabilitation

## 2015-01-10 ENCOUNTER — Encounter: Payer: PPO | Attending: Physical Medicine & Rehabilitation | Admitting: Registered Nurse

## 2015-01-10 ENCOUNTER — Encounter: Payer: Self-pay | Admitting: Registered Nurse

## 2015-01-10 VITALS — BP 124/50 | HR 102 | Resp 14

## 2015-01-10 DIAGNOSIS — M1712 Unilateral primary osteoarthritis, left knee: Secondary | ICD-10-CM | POA: Diagnosis not present

## 2015-01-10 DIAGNOSIS — G894 Chronic pain syndrome: Secondary | ICD-10-CM | POA: Insufficient documentation

## 2015-01-10 DIAGNOSIS — Z5181 Encounter for therapeutic drug level monitoring: Secondary | ICD-10-CM | POA: Insufficient documentation

## 2015-01-10 DIAGNOSIS — Z79899 Other long term (current) drug therapy: Secondary | ICD-10-CM | POA: Diagnosis present

## 2015-01-10 DIAGNOSIS — M25552 Pain in left hip: Secondary | ICD-10-CM

## 2015-01-10 MED ORDER — OXYCODONE HCL 15 MG PO TABS: 15.0000 mg | ORAL_TABLET | Freq: Three times a day (TID) | ORAL | Status: DC | PRN

## 2015-01-10 NOTE — Progress Notes (Signed)
Subjective:    Patient ID: Kari Martinez, female    DOB: [birth year], 81 y.o.   MRN: [id number]  HPI: [name] is a 81 year old female who returns for follow up for chronic pain and medication refill. She says her pain is located in her lower back, left hip which radiates into her left groin radiating into left hip to left knee. She rates her pain 6. Her current exercise regime is performing chair exercises and walking in her home with her walker.  Pain Inventory Average Pain 6 Pain Right Now 6 My pain is intermittent, sharp and stabbing  In the last 24 hours, has pain interfered with the following? General activity 6 Relation with others 7 Enjoyment of life 7 What TIME of day is your pain at its worst? morning, daytime Sleep (in general) Fair  Pain is worse with: walking and standing Pain improves with: rest, heat/ice and medication Relief from Meds: 5  Mobility walk with assistance use a walker how many minutes can you walk? 5-10 ability to climb steps?  yes do you drive?  yes  Function retired I need assistance with the following:  household duties and shopping  Neuro/Psych trouble walking  Prior Studies Any changes since last visit?  no  Physicians involved in your care Any changes since last visit?  no   Family History  Problem Relation Age of Onset  . Heart disease Father    History   Social History  . Marital Status: Widowed    Spouse Name: N/A  . Number of Children: N/A  . Years of Education: N/A   Social History Main Topics  . Smoking status: Never Smoker   . Smokeless tobacco: Never Used  . Alcohol Use: No  . Drug Use: No  . Sexual Activity: Not on file   Other Topics Concern  . None   Social History Narrative   Past Surgical History  Procedure Laterality Date  . Breast lumpectomy    . Abdominal hysterectomy      Ovaries intact  . Thyroid surgery      Partial, left lobe  . Knee surgery      left torn meniscus  .  Cholecystectomy    . Tonsillectomy    . Carpal tunnel release      right  . Lump in breast      Left Benign  . Gallbladder surgery    . Meniscus repair    . Radiofrequency ablation nerves      by Dr. Letta Pate  . Cataract extraction, bilateral      and lens implants Dr. Katy Fitch 2010  . Cholecystectomy    . Dilation and curettage of uterus     Past Medical History  Diagnosis Date  . Hypertension   . Arthritis   . Spinal stenosis     lumbar, bulging disc, receiving injections 01-01-2011,02-12-2011, Dr. Letta Pate (pain management)  . Left humeral fracture Category 01/23/2012  . Osteoarthritis   . Hyperlipidemia   . Thyroid disease     Hypo  . Osteopenia   . Chronic renal insufficiency, stage III (moderate)   . Nephrosclerosis     prn, Dr. Moshe Cipro  . Gout     Dr. Ouida Sills Rheumatologist   BP 124/50 mmHg  Pulse 102  Resp 14  SpO2 91%  Opioid Risk Score:   Fall Risk Score: Moderate Fall Risk (6-13 points)`1  Depression screen PHQ 2/9  No flowsheet data found.   Review of  Systems  Musculoskeletal: Positive for gait problem.  All other systems reviewed and are negative.      Objective:   Physical Exam  Constitutional: She is oriented to person, place, and time. She appears well-developed and well-nourished.  HENT:  Head: Normocephalic and atraumatic.  Neck: Normal range of motion. Neck supple.  Cardiovascular: Normal rate and regular rhythm.   Pulmonary/Chest: Effort normal and breath sounds normal.  Musculoskeletal:  Normal Muscle Bulk and Muscle Testing Reveals: Upper Extremities: Right: Full ROM and Muscle Strength 5/5. Left: Decreased ROM 45 Degrees and Muscle Strength 5/5 Lumbar Paraspinal Tenderness: L-3- L-5 Lower Extremities: Right: Full ROM and Muscle Strength 5/5 Left Lower Extremity Extension Produces pain into Popliteal Fossa and Left Lower Extremity Flexion Produces pain into Patella and Left groin. Arises from chair with ease/ Using Walker for  support  Neurological: She is alert and oriented to person, place, and time.  Skin: Skin is warm and dry.  Psychiatric: She has a normal mood and affect.  Nursing note and vitals reviewed.         Assessment & Plan:  1. Lumbar spinal stenosis with right L3-4 radiculitis sensory only: Encouraged to increase activity and use heat therapy.  2. Left greater than right hip osteoarthritis: Continue with exercise and voltaren gel. 3. Chronic pain syndrome: Good Relief with current medication regime  4.Left Knee Osteoarthritis: S/P Synvisc injection 10/12/14. Refilled: Oxycodone 15 mg one tablet 3 times a day #90 and Tramadol 50 mg 1 tablet 4 times a day #120.   20 minutes of face to face patient care time was spent during this visit. All questions were encouraged and answered.   F/u in 1 month

## 2015-02-06 ENCOUNTER — Encounter: Payer: PPO | Attending: Physical Medicine & Rehabilitation | Admitting: Registered Nurse

## 2015-02-06 ENCOUNTER — Encounter: Payer: Self-pay | Admitting: Registered Nurse

## 2015-02-06 VITALS — BP 160/74 | HR 106 | Resp 14

## 2015-02-06 DIAGNOSIS — G894 Chronic pain syndrome: Secondary | ICD-10-CM | POA: Insufficient documentation

## 2015-02-06 DIAGNOSIS — M25552 Pain in left hip: Secondary | ICD-10-CM

## 2015-02-06 DIAGNOSIS — Z79899 Other long term (current) drug therapy: Secondary | ICD-10-CM | POA: Diagnosis present

## 2015-02-06 DIAGNOSIS — Z5181 Encounter for therapeutic drug level monitoring: Secondary | ICD-10-CM | POA: Diagnosis present

## 2015-02-06 DIAGNOSIS — M1712 Unilateral primary osteoarthritis, left knee: Secondary | ICD-10-CM | POA: Diagnosis not present

## 2015-02-06 MED ORDER — OXYCODONE HCL 15 MG PO TABS: 15.0000 mg | ORAL_TABLET | Freq: Three times a day (TID) | ORAL | Status: DC | PRN

## 2015-02-06 NOTE — Progress Notes (Signed)
Subjective:    Patient ID: Kari Martinez, female    DOB: [birth year], 81 y.o.   MRN: [id number]  HPI: [name] is a 81 year old female who returns for follow up for chronic pain and medication refill. She says her pain is located in her lower back, left hip which radiates into her left groin radiating into left hip to left knee. She rates her pain 6. Her current exercise regime is performing chair exercises and walking in her home with her walker.  Pain Inventory Average Pain 5 Pain Right Now 6 My pain is intermittent, sharp, stabbing and tingling  In the last 24 hours, has pain interfered with the following? General activity 7 Relation with others 7 Enjoyment of life 8 What TIME of day is your pain at its worst? morning and daytime  Sleep (in general) Fair  Pain is worse with: walking and standing Pain improves with: rest, heat/ice and medication Relief from Meds: 6  Mobility use a walker how many minutes can you walk? 5-10 ability to climb steps?  yes do you drive?  yes  Function retired  Neuro/Psych weakness trouble walking  Prior Studies Any changes since last visit?  no  Physicians involved in your care Any changes since last visit?  no   Family History  Problem Relation Age of Onset  . Heart disease Father    History   Social History  . Marital Status: Widowed    Spouse Name: N/A  . Number of Children: N/A  . Years of Education: N/A   Social History Main Topics  . Smoking status: Never Smoker   . Smokeless tobacco: Never Used  . Alcohol Use: No  . Drug Use: No  . Sexual Activity: Not on file   Other Topics Concern  . None   Social History Narrative   Past Surgical History  Procedure Laterality Date  . Breast lumpectomy    . Abdominal hysterectomy      Ovaries intact  . Thyroid surgery      Partial, left lobe  . Knee surgery      left torn meniscus  . Cholecystectomy    . Tonsillectomy    . Carpal tunnel release      right  .  Lump in breast      Left Benign  . Gallbladder surgery    . Meniscus repair    . Radiofrequency ablation nerves      by Dr. Letta Pate  . Cataract extraction, bilateral      and lens implants Dr. Katy Fitch 2010  . Cholecystectomy    . Dilation and curettage of uterus     Past Medical History  Diagnosis Date  . Hypertension   . Arthritis   . Spinal stenosis     lumbar, bulging disc, receiving injections 01-01-2011,02-12-2011, Dr. Letta Pate (pain management)  . Left humeral fracture Category 01/23/2012  . Osteoarthritis   . Hyperlipidemia   . Thyroid disease     Hypo  . Osteopenia   . Chronic renal insufficiency, stage III (moderate)   . Nephrosclerosis     prn, Dr. Moshe Cipro  . Gout     Dr. Ouida Sills Rheumatologist   BP 160/74 mmHg  Pulse 106  Resp 14  SpO2 96%  Opioid Risk Score:   Fall Risk Score: Moderate Fall Risk (6-13 points)`1  Depression screen PHQ 2/9  No flowsheet data found.  Review of Systems  HENT: Negative.   Eyes: Negative.   Respiratory:  Negative.   Cardiovascular: Negative.   Gastrointestinal: Negative.   Endocrine: Negative.   Genitourinary: Negative.   Musculoskeletal: Positive for myalgias, back pain and arthralgias.  Skin: Negative.   Allergic/Immunologic: Negative.   Neurological: Positive for weakness.       Trouble walking  Hematological: Negative.   Psychiatric/Behavioral: Negative.        Objective:   Physical Exam  Constitutional: She is oriented to person, place, and time. She appears well-developed and well-nourished.  HENT:  Head: Normocephalic and atraumatic.  Neck: Normal range of motion. Neck supple.  Cardiovascular: Normal rate and regular rhythm.   Pulmonary/Chest: Effort normal and breath sounds normal.  Musculoskeletal:  Normal Muscle Bulk and Muscle Testing Reveals: Upper Extremities: Full ROM and Muscle Strength 5/5 Lumbar Paraspinal Tenderness: L-3- L-5 Lower Extremities: Full ROM and Muscle Strength 5/5 Left  Lower Extremity Flexion Produces Pain into left hip, patella and radiating into extremity laterally. Arises from chair with ease/ Using walker for support   Neurological: She is alert and oriented to person, place, and time.  Skin: Skin is warm and dry.  Psychiatric: She has a normal mood and affect.  Nursing note and vitals reviewed.         Assessment & Plan:  1. Lumbar spinal stenosis with right L3-4 radiculitis sensory only: Encouraged to increase activity and use heat therapy.  2. Left greater than right hip osteoarthritis: Continue with exercise and voltaren gel. 3. Chronic pain syndrome: Good Relief with current medication regime  4.Left Knee Osteoarthritis:  Refilled: Oxycodone 15 mg one tablet 3 times a day #90 and Tramadol 50 mg 1 tablet 4 times a day #120.   20 minutes of face to face patient care time was spent during this visit. All questions were encouraged and answered.   F/u in 1 month

## 2015-02-18 ENCOUNTER — Other Ambulatory Visit: Payer: Self-pay | Admitting: Physical Medicine & Rehabilitation

## 2015-03-07 ENCOUNTER — Encounter: Payer: Self-pay | Admitting: Registered Nurse

## 2015-03-07 ENCOUNTER — Encounter: Payer: PPO | Attending: Physical Medicine & Rehabilitation | Admitting: Registered Nurse

## 2015-03-07 VITALS — BP 149/64 | HR 105 | Resp 14

## 2015-03-07 DIAGNOSIS — G894 Chronic pain syndrome: Secondary | ICD-10-CM

## 2015-03-07 DIAGNOSIS — M25552 Pain in left hip: Secondary | ICD-10-CM

## 2015-03-07 DIAGNOSIS — Z5181 Encounter for therapeutic drug level monitoring: Secondary | ICD-10-CM

## 2015-03-07 DIAGNOSIS — Z79899 Other long term (current) drug therapy: Secondary | ICD-10-CM | POA: Diagnosis not present

## 2015-03-07 DIAGNOSIS — M1712 Unilateral primary osteoarthritis, left knee: Secondary | ICD-10-CM

## 2015-03-07 MED ORDER — OXYCODONE HCL 15 MG PO TABS: 15.0000 mg | ORAL_TABLET | Freq: Three times a day (TID) | ORAL | Status: DC | PRN

## 2015-03-07 NOTE — Progress Notes (Signed)
Subjective:    Patient ID: Kari Martinez, female    DOB: 08-15-1924, 81 y.o.   MRN: [id number]  HPI: Kari Martinez is a 81 year old female who returns for follow up for chronic pain and medication refill. She says her pain is located in her lower back, left hip which radiates into her left groin and left knee. She rates her pain 6. Her current exercise regime is performing chair exercises and walking in her home with her walker.  Pain Inventory Average Pain 5 Pain Right Now 6 My pain is intermittent, sharp, stabbing and aching  In the last 24 hours, has pain interfered with the following? General activity 6 Relation with others 7 Enjoyment of life 8 What TIME of day is your pain at its worst? morning,daytime Sleep (in general) Fair  Pain is worse with: walking and standing Pain improves with: rest, heat/ice, medication and injections Relief from Meds: 6  Mobility walk with assistance use a walker how many minutes can you walk? 5-10 ability to climb steps?  yes do you drive?  yes  Function retired I need assistance with the following:  household duties  Neuro/Psych trouble walking  Prior Studies Any changes since last visit?  no  Physicians involved in your care Any changes since last visit?  no   Family History  Problem Relation Age of Onset  . Heart disease Father    History   Social History  . Marital Status: Widowed    Spouse Name: N/A  . Number of Children: N/A  . Years of Education: N/A   Social History Main Topics  . Smoking status: Never Smoker   . Smokeless tobacco: Never Used  . Alcohol Use: No  . Drug Use: No  . Sexual Activity: Not on file   Other Topics Concern  . None   Social History Narrative   Past Surgical History  Procedure Laterality Date  . Breast lumpectomy    . Abdominal hysterectomy      Ovaries intact  . Thyroid surgery      Partial, left lobe  . Knee surgery      left torn meniscus  . Cholecystectomy    .  Tonsillectomy    . Carpal tunnel release      right  . Lump in breast      Left Benign  . Gallbladder surgery    . Meniscus repair    . Radiofrequency ablation nerves      by Dr. Letta Pate  . Cataract extraction, bilateral      and lens implants Dr. Katy Fitch 2010  . Cholecystectomy    . Dilation and curettage of uterus     Past Medical History  Diagnosis Date  . Hypertension   . Arthritis   . Spinal stenosis     lumbar, bulging disc, receiving injections 01-01-2011,02-12-2011, Dr. Letta Pate (pain management)  . Left humeral fracture Category 01/23/2012  . Osteoarthritis   . Hyperlipidemia   . Thyroid disease     Hypo  . Osteopenia   . Chronic renal insufficiency, stage III (moderate)   . Nephrosclerosis     prn, Dr. Moshe Cipro  . Gout     Dr. Ouida Sills Rheumatologist   BP 149/64 mmHg  Pulse 105  Resp 14  SpO2 96%  Opioid Risk Score:   Fall Risk Score: Moderate Fall Risk (6-13 points)`1  Depression screen PHQ 2/9  No flowsheet data found.   Review of Systems  Musculoskeletal: Positive for  gait problem.  All other systems reviewed and are negative.      Objective:   Physical Exam  Constitutional: She is oriented to person, place, and time. She appears well-developed and well-nourished.  HENT:  Head: Normocephalic and atraumatic.  Neck: Normal range of motion. Neck supple.  Cardiovascular: Normal rate and regular rhythm.   Pulmonary/Chest: Effort normal and breath sounds normal.  Musculoskeletal:  Normal Muscle Bulk and Muscle Testing Reveals: Upper Extremities: Right:Full ROM and Muscle Strength 5/5 Left: Decreased ROM 45 Degrees Muscle Strength 5/5 Lumbar Paraspinal Tenderness: L-3- L-5 Left Greater Trochanteric tenderness Lower Extremities: Full ROM and Muscle Strength 5/5 Left Lower Extremity Flexion Produces pain in Patella and Popping Sound Noted Arises from chair with ease/ Using walker for support  Neurological: She is alert and oriented to person,  place, and time.  Skin: Skin is warm and dry.  Psychiatric: She has a normal mood and affect.  Nursing note and vitals reviewed.         Assessment & Plan:  1. Lumbar spinal stenosis with right L3-4 radiculitis sensory only: Encouraged to increase activity and use heat therapy.  2. Left greater than right hip osteoarthritis: Continue with exercise and voltaren gel. Schedule for Left Hip Injection with Dr. Letta Pate 04/15/2015 3. Chronic pain syndrome: Good Relief with current medication regime  4.Left Knee Osteoarthritis:  Refilled: Oxycodone 15 mg one tablet 3 times a day #90 and Tramadol 50 mg 1 tablet 4 times a day #120.  Second script given to accommodate scheduled appointment  20 minutes of face to face patient care time was spent during this visit. All questions were encouraged and answered.   F/u in 1 month

## 2015-03-26 ENCOUNTER — Emergency Department (HOSPITAL_COMMUNITY): Payer: PPO

## 2015-03-26 ENCOUNTER — Encounter (HOSPITAL_COMMUNITY): Payer: Self-pay | Admitting: Emergency Medicine

## 2015-03-26 ENCOUNTER — Inpatient Hospital Stay (HOSPITAL_COMMUNITY)
Admission: EM | Admit: 2015-03-26 | Discharge: 2015-03-28 | DRG: 871 | Disposition: A | Discharge status: Home / Self Care | Payer: PPO | Attending: Internal Medicine | Admitting: Internal Medicine

## 2015-03-26 ENCOUNTER — Inpatient Hospital Stay (HOSPITAL_COMMUNITY): Payer: PPO

## 2015-03-26 DIAGNOSIS — Z961 Presence of intraocular lens: Secondary | ICD-10-CM | POA: Diagnosis present

## 2015-03-26 DIAGNOSIS — M549 Dorsalgia, unspecified: Secondary | ICD-10-CM | POA: Diagnosis present

## 2015-03-26 DIAGNOSIS — M4806 Spinal stenosis, lumbar region: Secondary | ICD-10-CM | POA: Diagnosis present

## 2015-03-26 DIAGNOSIS — Z7952 Long term (current) use of systemic steroids: Secondary | ICD-10-CM | POA: Diagnosis not present

## 2015-03-26 DIAGNOSIS — N19 Unspecified kidney failure: Secondary | ICD-10-CM

## 2015-03-26 DIAGNOSIS — Z882 Allergy status to sulfonamides status: Secondary | ICD-10-CM | POA: Diagnosis not present

## 2015-03-26 DIAGNOSIS — G8929 Other chronic pain: Secondary | ICD-10-CM | POA: Diagnosis present

## 2015-03-26 DIAGNOSIS — N390 Urinary tract infection, site not specified: Secondary | ICD-10-CM | POA: Diagnosis present

## 2015-03-26 DIAGNOSIS — Z9842 Cataract extraction status, left eye: Secondary | ICD-10-CM | POA: Diagnosis not present

## 2015-03-26 DIAGNOSIS — N183 Chronic kidney disease, stage 3 (moderate): Secondary | ICD-10-CM | POA: Diagnosis present

## 2015-03-26 DIAGNOSIS — G9340 Encephalopathy, unspecified: Secondary | ICD-10-CM | POA: Diagnosis present

## 2015-03-26 DIAGNOSIS — M109 Gout, unspecified: Secondary | ICD-10-CM | POA: Diagnosis present

## 2015-03-26 DIAGNOSIS — A419 Sepsis, unspecified organism: Principal | ICD-10-CM | POA: Diagnosis present

## 2015-03-26 DIAGNOSIS — M545 Low back pain: Secondary | ICD-10-CM | POA: Diagnosis not present

## 2015-03-26 DIAGNOSIS — K831 Obstruction of bile duct: Secondary | ICD-10-CM

## 2015-03-26 DIAGNOSIS — Z79891 Long term (current) use of opiate analgesic: Secondary | ICD-10-CM

## 2015-03-26 DIAGNOSIS — R7989 Other specified abnormal findings of blood chemistry: Secondary | ICD-10-CM

## 2015-03-26 DIAGNOSIS — M48061 Spinal stenosis, lumbar region without neurogenic claudication: Secondary | ICD-10-CM | POA: Diagnosis present

## 2015-03-26 DIAGNOSIS — E039 Hypothyroidism, unspecified: Secondary | ICD-10-CM | POA: Diagnosis present

## 2015-03-26 DIAGNOSIS — E785 Hyperlipidemia, unspecified: Secondary | ICD-10-CM | POA: Diagnosis present

## 2015-03-26 DIAGNOSIS — R7401 Elevation of levels of liver transaminase levels: Secondary | ICD-10-CM

## 2015-03-26 DIAGNOSIS — R945 Abnormal results of liver function studies: Secondary | ICD-10-CM

## 2015-03-26 DIAGNOSIS — I129 Hypertensive chronic kidney disease with stage 1 through stage 4 chronic kidney disease, or unspecified chronic kidney disease: Secondary | ICD-10-CM | POA: Diagnosis present

## 2015-03-26 DIAGNOSIS — M1990 Unspecified osteoarthritis, unspecified site: Secondary | ICD-10-CM | POA: Diagnosis present

## 2015-03-26 DIAGNOSIS — Z9049 Acquired absence of other specified parts of digestive tract: Secondary | ICD-10-CM | POA: Diagnosis present

## 2015-03-26 DIAGNOSIS — I10 Essential (primary) hypertension: Secondary | ICD-10-CM | POA: Diagnosis not present

## 2015-03-26 DIAGNOSIS — R410 Disorientation, unspecified: Secondary | ICD-10-CM | POA: Diagnosis present

## 2015-03-26 DIAGNOSIS — N179 Acute kidney failure, unspecified: Secondary | ICD-10-CM | POA: Diagnosis present

## 2015-03-26 DIAGNOSIS — Z90710 Acquired absence of both cervix and uterus: Secondary | ICD-10-CM | POA: Diagnosis not present

## 2015-03-26 DIAGNOSIS — Z9841 Cataract extraction status, right eye: Secondary | ICD-10-CM

## 2015-03-26 DIAGNOSIS — N3000 Acute cystitis without hematuria: Secondary | ICD-10-CM

## 2015-03-26 DIAGNOSIS — Z888 Allergy status to other drugs, medicaments and biological substances status: Secondary | ICD-10-CM

## 2015-03-26 DIAGNOSIS — R740 Nonspecific elevation of levels of transaminase and lactic acid dehydrogenase [LDH]: Secondary | ICD-10-CM

## 2015-03-26 LAB — URINALYSIS, ROUTINE W REFLEX MICROSCOPIC
GLUCOSE, UA: NEGATIVE mg/dL
Ketones, ur: NEGATIVE mg/dL
Nitrite: NEGATIVE
Protein, ur: 30 mg/dL — AB
Specific Gravity, Urine: 1.014 (ref 1.005–1.030)
Urobilinogen, UA: 1 mg/dL (ref 0.0–1.0)
pH: 5 (ref 5.0–8.0)

## 2015-03-26 LAB — CBC WITH DIFFERENTIAL/PLATELET
Basophils Absolute: 0 10*3/uL (ref 0.0–0.1)
Basophils Relative: 0 % (ref 0–1)
EOS PCT: 1 % (ref 0–5)
Eosinophils Absolute: 0.1 10*3/uL (ref 0.0–0.7)
HEMATOCRIT: 36.9 % (ref 36.0–46.0)
Hemoglobin: 11.7 g/dL — ABNORMAL LOW (ref 12.0–15.0)
LYMPHS PCT: 5 % — AB (ref 12–46)
Lymphs Abs: 0.7 10*3/uL (ref 0.7–4.0)
MCH: 30.3 pg (ref 26.0–34.0)
MCHC: 31.7 g/dL (ref 30.0–36.0)
MCV: 95.6 fL (ref 78.0–100.0)
Monocytes Absolute: 0.9 10*3/uL (ref 0.1–1.0)
Monocytes Relative: 7 % (ref 3–12)
Neutro Abs: 11.2 10*3/uL — ABNORMAL HIGH (ref 1.7–7.7)
Neutrophils Relative %: 87 % — ABNORMAL HIGH (ref 43–77)
Platelets: 234 10*3/uL (ref 150–400)
RBC: 3.86 MIL/uL — ABNORMAL LOW (ref 3.87–5.11)
RDW: 14.7 % (ref 11.5–15.5)
WBC: 12.9 10*3/uL — ABNORMAL HIGH (ref 4.0–10.5)

## 2015-03-26 LAB — CBC
HEMATOCRIT: 34.7 % — AB (ref 36.0–46.0)
HEMOGLOBIN: 10.7 g/dL — AB (ref 12.0–15.0)
MCH: 29.1 pg (ref 26.0–34.0)
MCHC: 30.8 g/dL (ref 30.0–36.0)
MCV: 94.3 fL (ref 78.0–100.0)
Platelets: 180 10*3/uL (ref 150–400)
RBC: 3.68 MIL/uL — AB (ref 3.87–5.11)
RDW: 14.3 % (ref 11.5–15.5)
WBC: 11.2 10*3/uL — ABNORMAL HIGH (ref 4.0–10.5)

## 2015-03-26 LAB — URINE MICROSCOPIC-ADD ON

## 2015-03-26 LAB — COMPREHENSIVE METABOLIC PANEL
ALBUMIN: 3.5 g/dL (ref 3.5–5.0)
ALK PHOS: 261 U/L — AB (ref 38–126)
ALT: 577 U/L — ABNORMAL HIGH (ref 14–54)
AST: 794 U/L — AB (ref 15–41)
Anion gap: 12 (ref 5–15)
BILIRUBIN TOTAL: 3.3 mg/dL — AB (ref 0.3–1.2)
BUN: 48 mg/dL — ABNORMAL HIGH (ref 6–20)
CHLORIDE: 98 mmol/L — AB (ref 101–111)
CO2: 29 mmol/L (ref 22–32)
Calcium: 8.4 mg/dL — ABNORMAL LOW (ref 8.9–10.3)
Creatinine, Ser: 3.66 mg/dL — ABNORMAL HIGH (ref 0.44–1.00)
GFR calc Af Amer: 12 mL/min — ABNORMAL LOW (ref >60)
GFR calc non Af Amer: 10 mL/min — ABNORMAL LOW (ref >60)
Glucose, Bld: 121 mg/dL — ABNORMAL HIGH (ref 65–99)
POTASSIUM: 5.1 mmol/L (ref 3.5–5.1)
Sodium: 139 mmol/L (ref 135–145)
Total Protein: 6.3 g/dL — ABNORMAL LOW (ref 6.5–8.1)

## 2015-03-26 LAB — CREATININE, SERUM
Creatinine, Ser: 3.56 mg/dL — ABNORMAL HIGH (ref 0.44–1.00)
GFR calc Af Amer: 12 mL/min — ABNORMAL LOW (ref >60)
GFR calc non Af Amer: 10 mL/min — ABNORMAL LOW (ref >60)

## 2015-03-26 LAB — TROPONIN I: Troponin I: 0.03 ng/mL (ref <0.031)

## 2015-03-26 MED ORDER — MORPHINE SULFATE 2 MG/ML IJ SOLN
1.0000 mg | INTRAMUSCULAR | Status: DC | PRN
  Administered 2015-03-27: 1 mg via INTRAVENOUS
  Filled 2015-03-26: qty 1

## 2015-03-26 MED ORDER — PREDNISONE 5 MG PO TABS
5.0000 mg | ORAL_TABLET | Freq: Every day | ORAL | Status: DC
  Administered 2015-03-27 – 2015-03-28 (×2): 5 mg via ORAL
  Filled 2015-03-26 (×2): qty 1

## 2015-03-26 MED ORDER — DEXTROSE 5 % IV SOLN
1.0000 g | INTRAVENOUS | Status: DC
  Administered 2015-03-27: 1 g via INTRAVENOUS
  Filled 2015-03-26 (×2): qty 10

## 2015-03-26 MED ORDER — ONDANSETRON HCL 4 MG/2ML IJ SOLN: 4.0000 mg | Freq: Four times a day (QID) | INTRAMUSCULAR | Status: DC | PRN

## 2015-03-26 MED ORDER — HEPARIN SODIUM (PORCINE) 5000 UNIT/ML IJ SOLN
5000.0000 [IU] | Freq: Three times a day (TID) | INTRAMUSCULAR | Status: DC
  Administered 2015-03-26 – 2015-03-28 (×5): 5000 [IU] via SUBCUTANEOUS
  Filled 2015-03-26 (×4): qty 1

## 2015-03-26 MED ORDER — ONDANSETRON HCL 4 MG PO TABS: 4.0000 mg | ORAL_TABLET | Freq: Four times a day (QID) | ORAL | Status: DC | PRN

## 2015-03-26 MED ORDER — OXYCODONE HCL 5 MG PO TABS
15.0000 mg | ORAL_TABLET | Freq: Four times a day (QID) | ORAL | Status: DC | PRN
  Administered 2015-03-27 (×2): 15 mg via ORAL
  Filled 2015-03-26 (×2): qty 3

## 2015-03-26 MED ORDER — DEXTROSE 5 % IV SOLN
1.0000 g | Freq: Once | INTRAVENOUS | Status: AC
  Administered 2015-03-26: 1 g via INTRAVENOUS
  Filled 2015-03-26: qty 10

## 2015-03-26 MED ORDER — GUAIFENESIN-DM 100-10 MG/5ML PO SYRP: 5.0000 mL | ORAL_SOLUTION | ORAL | Status: DC | PRN

## 2015-03-26 MED ORDER — LEVOTHYROXINE SODIUM 137 MCG PO TABS
137.0000 ug | ORAL_TABLET | Freq: Every day | ORAL | Status: DC
  Administered 2015-03-27 – 2015-03-28 (×2): 137 ug via ORAL
  Filled 2015-03-26 (×3): qty 1

## 2015-03-26 MED ORDER — ALBUTEROL SULFATE (2.5 MG/3ML) 0.083% IN NEBU: 2.5000 mg | INHALATION_SOLUTION | RESPIRATORY_TRACT | Status: DC | PRN

## 2015-03-26 MED ORDER — DIAZEPAM 5 MG PO TABS
5.0000 mg | ORAL_TABLET | Freq: Every day | ORAL | Status: DC | PRN
  Administered 2015-03-28: 5 mg via ORAL
  Filled 2015-03-26: qty 1

## 2015-03-26 MED ORDER — SODIUM CHLORIDE 0.9 % IV SOLN
INTRAVENOUS | Status: DC
  Administered 2015-03-26: 18:00:00 via INTRAVENOUS
  Administered 2015-03-27: 125 mL/h via INTRAVENOUS
  Administered 2015-03-27 – 2015-03-28 (×2): via INTRAVENOUS

## 2015-03-26 NOTE — H&P (Addendum)
PATIENT DETAILS Name: Kari Martinez Age: 81 y.o. Sex: female Date of Birth: 1924-02-06 Admit Date: 03/26/2015 EVO:[id number] Weyman Croon, MD Referring Physician:Dr Alvino Chapel   CHIEF COMPLAINT:  Confusion  HPI: Kari Martinez is a 81 y.o. female with a Past Medical History of chronic back pain on narcotic/Neurontin, hypertension, hypothyroidism, arthritis on chronic prednisone who presents today with the above noted complaint. Patient is slightly confused hence most of this history is also collaborated by the patient's granddaughter Investment banker, corporate at [facility]) at bedside. Apparently patient was in her usual state of health till yesterday afternoon, following which she does not have a good recollection of events. Patient claims that she does not remember what exactly happened in the afternoon yesterday, however she called her granddaughter today to come and help her. Granddaughter found her confused, subsequently brought into the emergency room for further evaluation. In the ED, patient was found to have acute renal failure, elevated LFTs and a UA consistent with UTI. Per granddaughter, patient apparently had vomited as she found with vomitus in the trash can. Patient denies diarrhea or any abdominal pain currently. There is no history of fever. No history of tongue bite.  ALLERGIES:   Allergies  Allergen Reactions  . Alendronate Sodium Other (See Comments)    Throat swelling  . Ketorolac Other (See Comments)    "Retained fluid in lungs"  . Ketorolac Tromethamine Other (See Comments)    FLUID RETENTION  . Mobic [Meloxicam]     Kidney decline  . Nsaids Other (See Comments)    Elevated kidney function  . Risedronate Sodium Other (See Comments)    Throat swelling and mouth swelling  . Septra [Sulfamethoxazole-Trimethoprim]     Rash  . Vioxx [Rofecoxib]     Kidney decline  . Allopurinol Rash  . Sulfa Antibiotics Rash    PAST MEDICAL HISTORY: Past Medical History  Diagnosis  Date  . Hypertension   . Arthritis   . Spinal stenosis     lumbar, bulging disc, receiving injections 01-01-2011,02-12-2011, Dr. Letta Pate (pain management)  . Left humeral fracture Category 01/23/2012  . Osteoarthritis   . Hyperlipidemia   . Thyroid disease     Hypo  . Osteopenia   . Chronic renal insufficiency, stage III (moderate)   . Nephrosclerosis     prn, Dr. Moshe Cipro  . Gout     Dr. Ouida Sills Rheumatologist    PAST SURGICAL HISTORY: Past Surgical History  Procedure Laterality Date  . Breast lumpectomy    . Abdominal hysterectomy      Ovaries intact  . Thyroid surgery      Partial, left lobe  . Knee surgery      left torn meniscus  . Cholecystectomy    . Tonsillectomy    . Carpal tunnel release      right  . Lump in breast      Left Benign  . Gallbladder surgery    . Meniscus repair    . Radiofrequency ablation nerves      by Dr. Letta Pate  . Cataract extraction, bilateral      and lens implants Dr. Katy Fitch 2010  . Cholecystectomy    . Dilation and curettage of uterus      MEDICATIONS AT HOME: Prior to Admission medications   Medication Sig Start Date End Date Taking? Authorizing Provider  Ascorbic Acid (VITAMIN C) 500 MG CAPS Take 500 mg by mouth daily.   Yes Historical Provider, MD  bumetanide Cleda Clarks)  2 MG tablet Take 2 mg by mouth daily.   Yes Historical Provider, MD  calcium carbonate (OS-CAL) 600 MG TABS Take 600 mg by mouth 2 (two) times daily with a meal.   Yes Historical Provider, MD  Cholecalciferol (VITAMIN D) 1000 UNITS capsule Take 1,000 Units by mouth daily.   Yes Historical Provider, MD  diazepam (VALIUM) 5 MG tablet Take 5 mg by mouth daily as needed for anxiety. Prior to procedure 05/26/13  Yes Historical Provider, MD  ezetimibe-simvastatin (VYTORIN) 10-20 MG per tablet Take 1 tablet by mouth daily with breakfast.   Yes Historical Provider, MD  Febuxostat (ULORIC) 80 MG TABS Take 80 mg by mouth daily.   Yes Historical Provider, MD    fexofenadine (ALLEGRA) 180 MG tablet Take 180 mg by mouth daily.   Yes Historical Provider, MD  gabapentin (NEURONTIN) 100 MG capsule TAKE 2 CAPSULES BY MOUTH THREE TIMES DAILY. 11/15/14  Yes Charlett Blake, MD  Glucosamine HCl 1000 MG TABS Take 1,000 mg by mouth 2 (two) times daily.   Yes Historical Provider, MD  irbesartan (AVAPRO) 300 MG tablet Take 300 mg by mouth daily.  12/14/12  Yes Historical Provider, MD  levothyroxine (SYNTHROID, LEVOTHROID) 137 MCG tablet Take 137 mcg by mouth daily.   Yes Historical Provider, MD  Multiple Vitamins-Minerals (MULTIVITAMIN WITH MINERALS) tablet Take 1 tablet by mouth daily.   Yes Historical Provider, MD  oxyCODONE (ROXICODONE) 15 MG immediate release tablet Take 1 tablet (15 mg total) by mouth every 8 (eight) hours as needed for pain. 03/07/15  Yes Bayard Hugger, NP  predniSONE (DELTASONE) 5 MG tablet Take 5 mg by mouth daily.   Yes Historical Provider, MD  raloxifene (EVISTA) 60 MG tablet Take 60 mg by mouth daily.   Yes Historical Provider, MD  traMADol (ULTRAM) 50 MG tablet Take 1 tablet (50 mg total) by mouth 4 (four) times daily. 12/11/14  Yes Bayard Hugger, NP  VOLTAREN 1 % GEL APPLY 2 GRAMS TO THE AFFECTED AREA FOUR TIMES DAILY. 02/18/15  Yes Charlett Blake, MD    FAMILY HISTORY: Family History  Problem Relation Age of Onset  . Heart disease Father     SOCIAL HISTORY:  reports that she has never smoked. She has never used smokeless tobacco. She reports that she does not drink alcohol or use illicit drugs. Lives at: Home Mobility: Independent  REVIEW OF SYSTEMS: limited but denies the following Constitutional:   No  weight loss, night sweats,  Fevers  HEENT:    No headaches, Dysphagia,Tooth/dental problems,Sore throat,   Cardio-vascular: No chest pain,Orthopnea, PND,lower extremity edema, anasarca, palpitations  GI:  No  abdominal pain,diarrhea, melena or hematochezia  Resp: No shortness of breath, cough,  hemoptysis,plueritic chest pain.   Skin:  No rash or lesions.  GU:  No dysuria, no urgency or frequency.  No flank pain.  Musculoskeletal: No joint pain or swelling.    Endocrine: No heat intolerance, no cold intolerance  Psych: No change in mood or affect.    PHYSICAL EXAM: Blood pressure 111/46, pulse 92, temperature 98.5 F (36.9 C), temperature source Oral, resp. rate 18, SpO2 95 %.  General appearance :Awake, alert, not in any distress. Speech Clear. Not toxic Looking HEENT: Atraumatic and Normocephalic, pupils equally reactive to light and accomodation Neck: supple, no JVD. No cervical lymphadenopathy.  Chest:Good air entry bilaterally, no added sounds  CVS: S1 S2 regular, no murmurs.  Abdomen: Bowel sounds present, Non tender and not distended with  no gaurding, rigidity or rebound. Extremities: B/L Lower Ext shows no edema, both legs are warm to touch Neurology:  Non focal Skin:No Rash Wounds:N/A  LABS ON ADMISSION:   Recent Labs  03/26/15 1441  NA 139  K 5.1  CL 98*  CO2 29  GLUCOSE 121*  BUN 48*  CREATININE 3.66*  CALCIUM 8.4*    Recent Labs  03/26/15 1441  AST 794*  ALT 577*  ALKPHOS 261*  BILITOT 3.3*  PROT 6.3*  ALBUMIN 3.5   No results for input(s): LIPASE, AMYLASE in the last 72 hours.  Recent Labs  03/26/15 1441  WBC 12.9*  NEUTROABS 11.2*  HGB 11.7*  HCT 36.9  MCV 95.6  PLT 234    Recent Labs  03/26/15 1441  TROPONINI 0.03   No results for input(s): DDIMER in the last 72 hours. Invalid input(s): POCBNP   RADIOLOGIC STUDIES ON ADMISSION: Dg Chest 2 View  03/26/2015   CLINICAL DATA:  Altered mental status  EXAM: CHEST  2 VIEW  COMPARISON:  09/27/2009  FINDINGS: Normal heart size. No pleural effusion or edema. Lungs are hyperinflated but clear.There is no airspace consolidation identified.  IMPRESSION: 1. No acute cardiopulmonary abnormalities.   Electronically Signed   By: Kerby Moors M.D.   On: 03/26/2015 14:58   Ct  Head Wo Contrast  03/26/2015   CLINICAL DATA:  Altered mental status, does not know what has happened since 1400 hours yesterday, shaky, trouble walking, history hypertension, hyperlipidemia  EXAM: CT HEAD WITHOUT CONTRAST  TECHNIQUE: Contiguous axial images were obtained from the base of the skull through the vertex without intravenous contrast.  COMPARISON:  None  FINDINGS: Generalized atrophy.  Normal ventricular morphology.  No midline shift or mass effect.  Normal appearance of brain parenchyma.  No intracranial hemorrhage, mass lesion or evidence acute infarction.  No extra-axial fluid collections.  Osseous structures unremarkable.  Air-fluid level RIGHT maxillary sinus.  IMPRESSION: No acute intracranial abnormalities.  RIGHT maxillary sinus disease.   Electronically Signed   By: Lavonia Dana M.D.   On: 03/26/2015 15:09    EKG: Personally reviewed. NSR  ASSESSMENT AND PLAN: Present on Admission:  . Urinary tract infection: Continue with IV Rocephin, follow urine culture. Notsure if this alone explains encephalopathy.  . ARF (acute renal failure): Suspect prerenal azotemia from vomiting, Bumex and Avapro use.Start IV fluids-hold Bumex and Avapro. Abdominal status renal ultrasound ordered-but no bladder palpable on exam. Strict intake and output. Follow creatinine. If any further worsening, will consult nephrology.   . Elevated LFTs: Etiology uncertain-could have some shock liver in the setting of vomiting/antihypertensives/diuretics causing ?hypotension at home (BP soft in the ED). He is status post cholecystectomy, will check acute hepatitis serology and a abdominal ultrasound. Avoid hepatotoxic agents, follow LFTs. However abdomen is nontender on exam.  . Acute encephalopathy: Multifactorial-suspect related to UTI,metabolic derangements, Neurontin use in the setting of renal failure. CT head negative, nonfocal exam. Will continue IV antibiotics, will attempt to correct metabolic  derangements-however if mental status still persists in spite of electrolyte improvement-she will need a MRI of the brain at some point.   . Back pain: Due to spinal stenosis. On chronic narcotics-given significantly elevated creatinine-would hold tramadol and neurontin  . Essential (primary) hypertension: Hold Bumex and Avapro-given ARF.  Further plan will depend as patient's clinical course evolves and further radiologic and laboratory data become available. Patient will be monitored closely.  Above noted plan was discussed with patient/grand daughter face to face at  bedside, they were in agreement.   CONSULTS: None  DVT Prophylaxis: Prophylactic  Heparin  Code Status: Full Code  Disposition Plan:  Discharge back home in 2-3 days,but may warrant SNF depending on clinical course  Total time spent  55 minutes.Greater than 50% of this time was spent in counseling, explanation of diagnosis, planning of further management, and coordination of care.  [facility] Pager [phone]  If 7PM-7AM, please contact night-coverage [email or web address] Password [facility] 03/26/2015, 5:05 PM

## 2015-03-26 NOTE — Progress Notes (Signed)
Utilization Review completed.  Bekka Qian RN CM  

## 2015-03-26 NOTE — ED Notes (Signed)
Per family pt normally has good memory and is oriented 4x. Went to check on pt, found she did not know anything that had happened since 2pm yesterday. Found a trashcan dumped out on the floor with emesis in it. Pt oriented to person, place and situation, but unsure of year. Pt also reports she feels shaky, and has trouble walking because she is shaky. Minor tremor noted in triage.

## 2015-03-26 NOTE — ED Provider Notes (Signed)
CSN: [id number]     Arrival date & time 03/26/15  1405 History   First MD Initiated Contact with Patient 03/26/15 1426     Chief Complaint  Patient presents with  . Altered Mental Status     (Consider location/radiation/quality/duration/timing/severity/associated sxs/prior Treatment) Patient is a 81 y.o. female presenting with altered mental status. The history is provided by the patient and a relative.  Altered Mental Status Associated symptoms: vomiting   Associated symptoms: no abdominal pain, no headaches, no nausea, no rash and no weakness    patient presents with some memory issues. Reportedly has been having problems remembering since yesterday. She states she cannot remember things that happened last night and is having trouble remember today. She knows that her daughter drove her here today in the patient's car. She does not remember what she had for lunch. No headache. No confusion. She's otherwise been doing well. No previous history of memory impairment. No previous history of stroke. No dysuria. No chest pain. patient apparently vomited to because they found some vomiting in the trash can which she does not remember doing it.  Past Medical History  Diagnosis Date  . Hypertension   . Arthritis   . Spinal stenosis     lumbar, bulging disc, receiving injections 01-01-2011,02-12-2011, Dr. Letta Pate (pain management)  . Left humeral fracture Category 01/23/2012  . Osteoarthritis   . Hyperlipidemia   . Thyroid disease     Hypo  . Osteopenia   . Chronic renal insufficiency, stage III (moderate)   . Nephrosclerosis     prn, Dr. Moshe Cipro  . Gout     Dr. Ouida Sills Rheumatologist   Past Surgical History  Procedure Laterality Date  . Breast lumpectomy    . Abdominal hysterectomy      Ovaries intact  . Thyroid surgery      Partial, left lobe  . Knee surgery      left torn meniscus  . Cholecystectomy    . Tonsillectomy    . Carpal tunnel release      right  . Lump in  breast      Left Benign  . Gallbladder surgery    . Meniscus repair    . Radiofrequency ablation nerves      by Dr. Letta Pate  . Cataract extraction, bilateral      and lens implants Dr. Katy Fitch 2010  . Cholecystectomy    . Dilation and curettage of uterus     Family History  Problem Relation Age of Onset  . Heart disease Father    History  Substance Use Topics  . Smoking status: Never Smoker   . Smokeless tobacco: Never Used  . Alcohol Use: No   OB History    No data available     Review of Systems  Constitutional: Negative for activity change and appetite change.  Eyes: Negative for pain.  Respiratory: Negative for chest tightness and shortness of breath.   Cardiovascular: Negative for chest pain and leg swelling.  Gastrointestinal: Positive for vomiting. Negative for nausea, abdominal pain and diarrhea.  Genitourinary: Negative for flank pain.  Musculoskeletal: Negative for back pain and neck stiffness.  Skin: Negative for rash.  Neurological: Negative for weakness, numbness and headaches.       Memory impairment  Psychiatric/Behavioral: Negative for behavioral problems.       Allergies  Alendronate sodium; Ketorolac; Ketorolac tromethamine; Mobic; Nsaids; Risedronate sodium; Septra; Vioxx; Allopurinol; and Sulfa antibiotics  Home Medications   Prior to Admission medications  Medication Sig Start Date End Date Taking? Authorizing Provider  Ascorbic Acid (VITAMIN C) 500 MG CAPS Take 500 mg by mouth daily.   Yes Historical Provider, MD  bumetanide (BUMEX) 2 MG tablet Take 2 mg by mouth daily.   Yes Historical Provider, MD  calcium carbonate (OS-CAL) 600 MG TABS Take 600 mg by mouth 2 (two) times daily with a meal.   Yes Historical Provider, MD  Cholecalciferol (VITAMIN D) 1000 UNITS capsule Take 1,000 Units by mouth daily.   Yes Historical Provider, MD  diazepam (VALIUM) 5 MG tablet Take 5 mg by mouth daily as needed for anxiety. Prior to procedure 05/26/13  Yes  Historical Provider, MD  ezetimibe-simvastatin (VYTORIN) 10-20 MG per tablet Take 1 tablet by mouth daily with breakfast.   Yes Historical Provider, MD  Febuxostat (ULORIC) 80 MG TABS Take 80 mg by mouth daily.   Yes Historical Provider, MD  fexofenadine (ALLEGRA) 180 MG tablet Take 180 mg by mouth daily.   Yes Historical Provider, MD  gabapentin (NEURONTIN) 100 MG capsule TAKE 2 CAPSULES BY MOUTH THREE TIMES DAILY. 11/15/14  Yes Charlett Blake, MD  Glucosamine HCl 1000 MG TABS Take 1,000 mg by mouth 2 (two) times daily.   Yes Historical Provider, MD  irbesartan (AVAPRO) 300 MG tablet Take 300 mg by mouth daily.  12/14/12  Yes Historical Provider, MD  levothyroxine (SYNTHROID, LEVOTHROID) 137 MCG tablet Take 137 mcg by mouth daily.   Yes Historical Provider, MD  Multiple Vitamins-Minerals (MULTIVITAMIN WITH MINERALS) tablet Take 1 tablet by mouth daily.   Yes Historical Provider, MD  oxyCODONE (ROXICODONE) 15 MG immediate release tablet Take 1 tablet (15 mg total) by mouth every 8 (eight) hours as needed for pain. 03/07/15  Yes Bayard Hugger, NP  predniSONE (DELTASONE) 5 MG tablet Take 5 mg by mouth daily.   Yes Historical Provider, MD  raloxifene (EVISTA) 60 MG tablet Take 60 mg by mouth daily.   Yes Historical Provider, MD  traMADol (ULTRAM) 50 MG tablet Take 1 tablet (50 mg total) by mouth 4 (four) times daily. 12/11/14  Yes Bayard Hugger, NP  VOLTAREN 1 % GEL APPLY 2 GRAMS TO THE AFFECTED AREA FOUR TIMES DAILY. 02/18/15  Yes Charlett Blake, MD   BP 114/41 mmHg  Pulse 100  Temp(Src) 98.5 F (36.9 C) (Oral)  Resp 18  SpO2 96% Physical Exam  Constitutional: She is oriented to person, place, and time. She appears well-developed and well-nourished.  HENT:  Head: Normocephalic and atraumatic.  Eyes: EOM are normal. Pupils are equal, round, and reactive to light.  Neck: Normal range of motion. Neck supple.  Cardiovascular: Normal rate, regular rhythm and normal heart sounds.   No murmur  heard. Pulmonary/Chest: Effort normal and breath sounds normal. No respiratory distress. She has no wheezes. She has no rales.  Abdominal: Soft. Bowel sounds are normal. She exhibits no distension. There is no tenderness. There is no rebound and no guarding.  Musculoskeletal: Normal range of motion.  Neurological: She is alert and oriented to person, place, and time. No cranial nerve deficit.  Patient is awake and appropriate. Some memory issues. Does not remember what she ate yesterday. Some issues remembering other activities also. [place] type car that she drives cannot remember what she had for lunch. She was able to remember 2 of 3 things at around 10 minutes.  Skin: Skin is warm and dry.  Psychiatric: She has a normal mood and affect. Her speech is normal.  Nursing note and vitals reviewed.   ED Course  Procedures (including critical care time) Labs Review Labs Reviewed  URINALYSIS, ROUTINE W REFLEX MICROSCOPIC (NOT AT [facility]) - Abnormal; Notable for the following:    Color, Urine AMBER (*)    APPearance CLOUDY (*)    Hgb urine dipstick LARGE (*)    Bilirubin Urine SMALL (*)    Protein, ur 30 (*)    Leukocytes, UA MODERATE (*)    All other components within normal limits  COMPREHENSIVE METABOLIC PANEL - Abnormal; Notable for the following:    Chloride 98 (*)    Glucose, Bld 121 (*)    BUN 48 (*)    Creatinine, Ser 3.66 (*)    Calcium 8.4 (*)    Total Protein 6.3 (*)    AST 794 (*)    ALT 577 (*)    Alkaline Phosphatase 261 (*)    Total Bilirubin 3.3 (*)    GFR calc non Af Amer 10 (*)    GFR calc Af Amer 12 (*)    All other components within normal limits  CBC WITH DIFFERENTIAL/PLATELET - Abnormal; Notable for the following:    WBC 12.9 (*)    RBC 3.86 (*)    Hemoglobin 11.7 (*)    Neutrophils Relative % 87 (*)    Neutro Abs 11.2 (*)    Lymphocytes Relative 5 (*)    All other components within normal limits  URINE MICROSCOPIC-ADD ON - Abnormal; Notable for the  following:    Squamous Epithelial / LPF FEW (*)    Bacteria, UA MANY (*)    All other components within normal limits  URINE CULTURE  TROPONIN I    Imaging Review Dg Chest 2 View  03/26/2015   CLINICAL DATA:  Altered mental status  EXAM: CHEST  2 VIEW  COMPARISON:  09/27/2009  FINDINGS: Normal heart size. No pleural effusion or edema. Lungs are hyperinflated but clear.There is no airspace consolidation identified.  IMPRESSION: 1. No acute cardiopulmonary abnormalities.   Electronically Signed   By: Kerby Moors M.D.   On: 03/26/2015 14:58   Ct Head Wo Contrast  03/26/2015   CLINICAL DATA:  Altered mental status, does not know what has happened since 1400 hours yesterday, shaky, trouble walking, history hypertension, hyperlipidemia  EXAM: CT HEAD WITHOUT CONTRAST  TECHNIQUE: Contiguous axial images were obtained from the base of the skull through the vertex without intravenous contrast.  COMPARISON:  None  FINDINGS: Generalized atrophy.  Normal ventricular morphology.  No midline shift or mass effect.  Normal appearance of brain parenchyma.  No intracranial hemorrhage, mass lesion or evidence acute infarction.  No extra-axial fluid collections.  Osseous structures unremarkable.  Air-fluid level RIGHT maxillary sinus.  IMPRESSION: No acute intracranial abnormalities.  RIGHT maxillary sinus disease.   Electronically Signed   By: Lavonia Dana M.D.   On: 03/26/2015 15:09     EKG Interpretation None      MDM   Final diagnoses:  Urinary tract infection without hematuria, site unspecified  Renal failure  Elevated transaminase level    Patient with memory issue. He has urinary tract infection and worsening renal function. Baseline as of 6 months ago was 1.3. Also elevated LFTs. Will admit to internal medicine.    Davonna Belling, MD 03/26/15 [phone]

## 2015-03-26 NOTE — ED Notes (Signed)
Spoke to [place] and pt can go to floor at 16:58

## 2015-03-27 ENCOUNTER — Inpatient Hospital Stay (HOSPITAL_COMMUNITY): Payer: PPO

## 2015-03-27 DIAGNOSIS — M4806 Spinal stenosis, lumbar region: Secondary | ICD-10-CM

## 2015-03-27 DIAGNOSIS — I10 Essential (primary) hypertension: Secondary | ICD-10-CM

## 2015-03-27 DIAGNOSIS — N390 Urinary tract infection, site not specified: Secondary | ICD-10-CM

## 2015-03-27 DIAGNOSIS — R7989 Other specified abnormal findings of blood chemistry: Secondary | ICD-10-CM

## 2015-03-27 DIAGNOSIS — N179 Acute kidney failure, unspecified: Secondary | ICD-10-CM

## 2015-03-27 DIAGNOSIS — A419 Sepsis, unspecified organism: Principal | ICD-10-CM

## 2015-03-27 LAB — COMPREHENSIVE METABOLIC PANEL
ALBUMIN: 2.7 g/dL — AB (ref 3.5–5.0)
ALK PHOS: 175 U/L — AB (ref 38–126)
ALT: 323 U/L — AB (ref 14–54)
AST: 365 U/L — ABNORMAL HIGH (ref 15–41)
Anion gap: 11 (ref 5–15)
BUN: 48 mg/dL — ABNORMAL HIGH (ref 6–20)
CALCIUM: 7.5 mg/dL — AB (ref 8.9–10.3)
CO2: 25 mmol/L (ref 22–32)
CREATININE: 2.99 mg/dL — AB (ref 0.44–1.00)
Chloride: 101 mmol/L (ref 101–111)
GFR calc Af Amer: 15 mL/min — ABNORMAL LOW (ref >60)
GFR calc non Af Amer: 13 mL/min — ABNORMAL LOW (ref >60)
GLUCOSE: 86 mg/dL (ref 65–99)
Potassium: 4.3 mmol/L (ref 3.5–5.1)
SODIUM: 137 mmol/L (ref 135–145)
TOTAL PROTEIN: 5 g/dL — AB (ref 6.5–8.1)
Total Bilirubin: 1.5 mg/dL — ABNORMAL HIGH (ref 0.3–1.2)

## 2015-03-27 LAB — PROTIME-INR
INR: 1.28 (ref 0.00–1.49)
Prothrombin Time: 16.1 seconds — ABNORMAL HIGH (ref 11.6–15.2)

## 2015-03-27 LAB — CBC
HCT: 31.9 % — ABNORMAL LOW (ref 36.0–46.0)
Hemoglobin: 10.2 g/dL — ABNORMAL LOW (ref 12.0–15.0)
MCH: 30.2 pg (ref 26.0–34.0)
MCHC: 32 g/dL (ref 30.0–36.0)
MCV: 94.4 fL (ref 78.0–100.0)
PLATELETS: 165 10*3/uL (ref 150–400)
RBC: 3.38 MIL/uL — ABNORMAL LOW (ref 3.87–5.11)
RDW: 14.4 % (ref 11.5–15.5)
WBC: 7.1 10*3/uL (ref 4.0–10.5)

## 2015-03-27 LAB — AMMONIA: Ammonia: 12 umol/L (ref 9–35)

## 2015-03-27 NOTE — Progress Notes (Signed)
[facility] PROGRESS NOTE  Kari Martinez UMP:[id number] DOB: 02/07/1924 DOA: 03/26/2015 PCP: Reginia Naas, MD  Assessment/Plan: 1. UTI with sepsis 1. UA suggestive of UTI with presenting WBC of 12.9 and mental status change 2. Urine cx not ordered on admit, will order 3. Pt is continued on empiric rocephin 4. Clinically improving with normalized WBC 2. Elevated LFT's 1. Pt is s/p cholecystectomy 2. LFT's trending down 3. Ordered MRCP to r/o stone in biliary tree, pending 3. Acute encephalopathy 1. Resolved 2. Likely secondary to above 3. Ammonia is normal 4. Back pain 1. Stable 2. Cont analgesics as tolerated 5. HTN 1. BP stable 2. Cont monitor 6. ARF 1. Cr improved overnight 2. Cont hydration as tolerated 7. DVT prophylaxis 1. Heparin subQ  Code Status: Full Family Communication: Pt in room (indicate person spoken with, relationship, and if by phone, the number) Disposition Plan: Pending   Consultants:    Procedures:    Antibiotics:  Rocephin 6/21>>> (indicate start date, and stop date if known)  HPI/Subjective: Feels better. No complaints  Objective: Filed Vitals:   03/26/15 2148 03/27/15 0422 03/27/15 0500 03/27/15 1430  BP: 109/46 139/53 119/54 131/46  Pulse: 89 79  85  Temp: 98.1 F (36.7 C) 98.3 F (36.8 C)  98.6 F (37 C)  TempSrc: Oral Oral  Oral  Resp: 16 16  18   Height:      Weight:      SpO2: 97% 96%  97%    Intake/Output Summary (Last 24 hours) at 03/27/15 1930 Last data filed at 03/27/15 1100  Gross per 24 hour  Intake   1795 ml  Output   1250 ml  Net    545 ml   Filed Weights   03/26/15 1726  Weight: 84.3 kg (185 lb 13.6 oz)    Exam:   General:  Awake, in nad  Cardiovascular: regular, s1, s2  Respiratory: normal resp effort, no wheezing  Abdomen: soft,nondistended  Musculoskeletal: perfused, no clubbing   Data Reviewed: Basic Metabolic Panel:  Recent Labs Lab 03/26/15 1441 03/26/15 1830  03/27/15 0415  NA 139  --  137  K 5.1  --  4.3  CL 98*  --  101  CO2 29  --  25  GLUCOSE 121*  --  86  BUN 48*  --  48*  CREATININE 3.66* 3.56* 2.99*  CALCIUM 8.4*  --  7.5*   Liver Function Tests:  Recent Labs Lab 03/26/15 1441 03/27/15 0415  AST 794* 365*  ALT 577* 323*  ALKPHOS 261* 175*  BILITOT 3.3* 1.5*  PROT 6.3* 5.0*  ALBUMIN 3.5 2.7*   No results for input(s): LIPASE, AMYLASE in the last 168 hours.  Recent Labs Lab 03/27/15 1230  AMMONIA 12   CBC:  Recent Labs Lab 03/26/15 1441 03/26/15 1830 03/27/15 0520  WBC 12.9* 11.2* 7.1  NEUTROABS 11.2*  --   --   HGB 11.7* 10.7* 10.2*  HCT 36.9 34.7* 31.9*  MCV 95.6 94.3 94.4  PLT 234 180 165   Cardiac Enzymes:  Recent Labs Lab 03/26/15 1441  TROPONINI 0.03   BNP (last 3 results) No results for input(s): BNP in the last 8760 hours.  ProBNP (last 3 results) No results for input(s): PROBNP in the last 8760 hours.  CBG: No results for input(s): GLUCAP in the last 168 hours.  No results found for this or any previous visit (from the past 240 hour(s)).   Studies: Dg Chest 2 View  03/26/2015  CLINICAL DATA:  Altered mental status  EXAM: CHEST  2 VIEW  COMPARISON:  09/27/2009  FINDINGS: Normal heart size. No pleural effusion or edema. Lungs are hyperinflated but clear.There is no airspace consolidation identified.  IMPRESSION: 1. No acute cardiopulmonary abnormalities.   Electronically Signed   By: Kerby Moors M.D.   On: 03/26/2015 14:58   Ct Head Wo Contrast  03/26/2015   CLINICAL DATA:  Altered mental status, does not know what has happened since 1400 hours yesterday, shaky, trouble walking, history hypertension, hyperlipidemia  EXAM: CT HEAD WITHOUT CONTRAST  TECHNIQUE: Contiguous axial images were obtained from the base of the skull through the vertex without intravenous contrast.  COMPARISON:  None  FINDINGS: Generalized atrophy.  Normal ventricular morphology.  No midline shift or mass effect.   Normal appearance of brain parenchyma.  No intracranial hemorrhage, mass lesion or evidence acute infarction.  No extra-axial fluid collections.  Osseous structures unremarkable.  Air-fluid level RIGHT maxillary sinus.  IMPRESSION: No acute intracranial abnormalities.  RIGHT maxillary sinus disease.   Electronically Signed   By: Lavonia Dana M.D.   On: 03/26/2015 15:09   US Abdomen Complete  03/26/2015   CLINICAL DATA:  Elevated liver function tests  EXAM: ULTRASOUND ABDOMEN COMPLETE  COMPARISON:  None.  FINDINGS: Gallbladder: Surgically absent.  Common bile duct: Diameter: 11 mm. There is also intrahepatic biliary duct enlargement. Common bile duct is seen to the level the pancreatic head and there is no evidence of filling defect.  Liver: No focal lesion identified. Within normal limits in parenchymal echogenicity.  IVC: No abnormality visualized.  Pancreas: No definite duct enlargement or mass lesion.  Spleen: Size and appearance within normal limits.  Right Kidney: Length: 10 cm. Mild cortical thinning. No hydronephrosis.  Left Kidney: Length: 10 cm. Mild cortical thinning. No hydronephrosis. No definitive mass.  Abdominal aorta: No aneurysm visualized. The distal aorta is obscured by bowel gas.  IMPRESSION: Intra and extrahepatic bile duct enlargement post cholecystectomy. Given liver function tests, this is primarily concerning for biliary obstruction. No visible choledocholithiasis.   Electronically Signed   By: Monte Fantasia M.D.   On: 03/26/2015 19:53    Scheduled Meds: . cefTRIAXone (ROCEPHIN)  IV  1 g Intravenous Q24H  . heparin  5,000 Units Subcutaneous 3 times per day  . levothyroxine  137 mcg Oral QAC breakfast  . predniSONE  5 mg Oral Daily   Continuous Infusions: . sodium chloride 125 mL/hr at 03/27/15 1403    Principal Problem:   ARF (acute renal failure) Active Problems:   Back pain   Spinal stenosis of lumbar region   Urinary tract infection   Elevated LFTs   Essential  (primary) hypertension    [facility] Pager [phone]. If 7PM-7AM, please contact night-coverage at [email or web address], password [facility] 03/27/2015, 7:30 PM  LOS: 1 day

## 2015-03-28 LAB — COMPREHENSIVE METABOLIC PANEL
ALBUMIN: 2.6 g/dL — AB (ref 3.5–5.0)
ALT: 222 U/L — ABNORMAL HIGH (ref 14–54)
ANION GAP: 9 (ref 5–15)
AST: 172 U/L — ABNORMAL HIGH (ref 15–41)
Alkaline Phosphatase: 158 U/L — ABNORMAL HIGH (ref 38–126)
BUN: 37 mg/dL — AB (ref 6–20)
CALCIUM: 7.8 mg/dL — AB (ref 8.9–10.3)
CHLORIDE: 106 mmol/L (ref 101–111)
CO2: 25 mmol/L (ref 22–32)
Creatinine, Ser: 1.9 mg/dL — ABNORMAL HIGH (ref 0.44–1.00)
GFR calc Af Amer: 26 mL/min — ABNORMAL LOW (ref >60)
GFR calc non Af Amer: 22 mL/min — ABNORMAL LOW (ref >60)
Glucose, Bld: 85 mg/dL (ref 65–99)
Potassium: 3.7 mmol/L (ref 3.5–5.1)
Sodium: 140 mmol/L (ref 135–145)
Total Bilirubin: 0.6 mg/dL (ref 0.3–1.2)
Total Protein: 5.1 g/dL — ABNORMAL LOW (ref 6.5–8.1)

## 2015-03-28 LAB — HEPATITIS PANEL, ACUTE
HCV Ab: <0.1 s/co ratio (ref 0.0–0.9)
Hep A IgM: NEGATIVE
Hep B C IgM: NEGATIVE
Hepatitis B Surface Ag: NEGATIVE

## 2015-03-28 MED ORDER — CEFUROXIME AXETIL 250 MG PO TABS: 250.0000 mg | ORAL_TABLET | Freq: Two times a day (BID) | ORAL | Status: DC

## 2015-03-28 MED ORDER — DIAZEPAM 5 MG PO TABS: 5.0000 mg | ORAL_TABLET | Freq: Every day | ORAL | Status: DC | PRN

## 2015-03-28 NOTE — Discharge Instructions (Signed)
Please resume Bumex on 6/25

## 2015-03-28 NOTE — Discharge Summary (Signed)
Physician Discharge Summary  Kari Martinez GYJ:[id number] DOB: 05-07-1924 DOA: 03/26/2015  PCP: Reginia Naas, MD  Admit date: 03/26/2015 Discharge date: 03/28/2015  Time spent: 20 minutes  Recommendations for Outpatient Follow-up:  1. Follow up with PCP in 1-2 weeks 2. Repeat LFT's, renal panel, CBC in 1-2 weeks  Discharge Diagnoses:  Principal Problem:   ARF (acute renal failure) Active Problems:   Back pain   Spinal stenosis of lumbar region   Urinary tract infection   Elevated LFTs   Essential (primary) hypertension   Discharge Condition: Improved  Diet recommendation: Heart healthy  Filed Weights   03/26/15 1726  Weight: 84.3 kg (185 lb 13.6 oz)    History of present illness:  Please see admit h and p from 6/21 for details. Briefly, pt presented with confusion, found to be septic with likely UTI and elevated LFT's. The patient was admitted for further work up.  Hospital Course:  1. UTI with sepsis 1. UA suggestive of UTI with presenting WBC of 12.9 and mental status change 2. Pt is continued on empiric rocephin with rapid resolution of confusion 3. Clinically improving with normalized WBC 4. No growth on urine culture, although culture was obtained well after abx were started. 5. Pt to be transitioned to ceftin to complete course 2. Elevated LFT's 1. Pt is s/p cholecystectomy 2. LFT's trending down 3. MRCP without obstruction 4. Discussed case with GI who recommends repeating LFT's as outpatient as presenting elevated LFT's are possibly secondary to "shock liver" from sepsis 3. Acute encephalopathy 1. Resolved 2. Likely secondary to above 3. Ammonia is normal 4. Back pain 1. Stable 2. Cont analgesics as tolerated 5. HTN 1. BP stable 2. Cont monitor 6. ARF 1. Cr improved 2. Cont hydration as tolerated 7. DVT prophylaxis 1. Heparin subQ  Consultations:  GI over phone  Discharge Exam: Filed Vitals:   03/27/15 0500 03/27/15 1430 03/27/15 2201  03/28/15 0445  BP: 119/54 131/46 138/67 135/64  Pulse:  85 87 89  Temp:  98.6 F (37 C) 98 F (36.7 C) 98.4 F (36.9 C)  TempSrc:  Oral Oral Oral  Resp:  18 16 18   Height:      Weight:      SpO2:  97% 97% 97%    General: awake, in nad Cardiovascular: regular, s1, s2 Respiratory: normal resp effort, no wheezing  Discharge Instructions     Medication List    TAKE these medications        bumetanide 2 MG tablet  Commonly known as:  BUMEX  Take 2 mg by mouth daily.     calcium carbonate 600 MG Tabs tablet  Commonly known as:  OS-CAL  Take 600 mg by mouth 2 (two) times daily with a meal.     cefUROXime 250 MG tablet  Commonly known as:  CEFTIN  Take 1 tablet (250 mg total) by mouth 2 (two) times daily with a meal.     diazepam 5 MG tablet  Commonly known as:  VALIUM  Take 5 mg by mouth daily as needed for anxiety. Prior to procedure     diazepam 5 MG tablet  Commonly known as:  VALIUM  Take 1 tablet (5 mg total) by mouth daily as needed for anxiety.     ezetimibe-simvastatin 10-20 MG per tablet  Commonly known as:  VYTORIN  Take 1 tablet by mouth daily with breakfast.     fexofenadine 180 MG tablet  Commonly known as:  ALLEGRA  Take 180  mg by mouth daily.     gabapentin 100 MG capsule  Commonly known as:  NEURONTIN  TAKE 2 CAPSULES BY MOUTH THREE TIMES DAILY.     Glucosamine HCl 1000 MG Tabs  Take 1,000 mg by mouth 2 (two) times daily.     irbesartan 300 MG tablet  Commonly known as:  AVAPRO  Take 300 mg by mouth daily.     levothyroxine 137 MCG tablet  Commonly known as:  SYNTHROID, LEVOTHROID  Take 137 mcg by mouth daily.     multivitamin with minerals tablet  Take 1 tablet by mouth daily.     oxyCODONE 15 MG immediate release tablet  Commonly known as:  ROXICODONE  Take 1 tablet (15 mg total) by mouth every 8 (eight) hours as needed for pain.     predniSONE 5 MG tablet  Commonly known as:  DELTASONE  Take 5 mg by mouth daily.      raloxifene 60 MG tablet  Commonly known as:  EVISTA  Take 60 mg by mouth daily.     traMADol 50 MG tablet  Commonly known as:  ULTRAM  Take 1 tablet (50 mg total) by mouth 4 (four) times daily.     ULORIC 80 MG Tabs  Generic drug:  Febuxostat  Take 80 mg by mouth daily.     Vitamin C 500 MG Caps  Take 500 mg by mouth daily.     Vitamin D 1000 UNITS capsule  Take 1,000 Units by mouth daily.     VOLTAREN 1 % Gel  Generic drug:  diclofenac sodium  APPLY 2 GRAMS TO THE AFFECTED AREA FOUR TIMES DAILY.       Allergies  Allergen Reactions  . Alendronate Sodium Other (See Comments)    Throat swelling  . Ketorolac Other (See Comments)    "Retained fluid in lungs"  . Ketorolac Tromethamine Other (See Comments)    FLUID RETENTION  . Mobic [Meloxicam]     Kidney decline  . Nsaids Other (See Comments)    Elevated kidney function  . Risedronate Sodium Other (See Comments)    Throat swelling and mouth swelling  . Septra [Sulfamethoxazole-Trimethoprim]     Rash  . Vioxx [Rofecoxib]     Kidney decline  . Allopurinol Rash  . Sulfa Antibiotics Rash   Follow-up Information    Follow up with Reginia Naas, MD. Go on 04/02/2015.   Specialty:  Family Medicine   Why:  at 11:15am   With Lester Kinsman PA.  For Post Hospitalization Follow Up, Arrive 15 minutes prior to appointment   Contact information:   [place] [id number] [phone]        The results of significant diagnostics from this hospitalization (including imaging, microbiology, ancillary and laboratory) are listed below for reference.    Significant Diagnostic Studies: Dg Chest 2 View  03/26/2015   CLINICAL DATA:  Altered mental status  EXAM: CHEST  2 VIEW  COMPARISON:  09/27/2009  FINDINGS: Normal heart size. No pleural effusion or edema. Lungs are hyperinflated but clear.There is no airspace consolidation identified.  IMPRESSION: 1. No acute cardiopulmonary abnormalities.    Electronically Signed   By: Kerby Moors M.D.   On: 03/26/2015 14:58   Ct Head Wo Contrast  03/26/2015   CLINICAL DATA:  Altered mental status, does not know what has happened since 1400 hours yesterday, shaky, trouble walking, history hypertension, hyperlipidemia  EXAM: CT HEAD WITHOUT CONTRAST  TECHNIQUE: Contiguous axial  images were obtained from the base of the skull through the vertex without intravenous contrast.  COMPARISON:  None  FINDINGS: Generalized atrophy.  Normal ventricular morphology.  No midline shift or mass effect.  Normal appearance of brain parenchyma.  No intracranial hemorrhage, mass lesion or evidence acute infarction.  No extra-axial fluid collections.  Osseous structures unremarkable.  Air-fluid level RIGHT maxillary sinus.  IMPRESSION: No acute intracranial abnormalities.  RIGHT maxillary sinus disease.   Electronically Signed   By: Lavonia Dana M.D.   On: 03/26/2015 15:09   US Abdomen Complete  03/26/2015   CLINICAL DATA:  Elevated liver function tests  EXAM: ULTRASOUND ABDOMEN COMPLETE  COMPARISON:  None.  FINDINGS: Gallbladder: Surgically absent.  Common bile duct: Diameter: 11 mm. There is also intrahepatic biliary duct enlargement. Common bile duct is seen to the level the pancreatic head and there is no evidence of filling defect.  Liver: No focal lesion identified. Within normal limits in parenchymal echogenicity.  IVC: No abnormality visualized.  Pancreas: No definite duct enlargement or mass lesion.  Spleen: Size and appearance within normal limits.  Right Kidney: Length: 10 cm. Mild cortical thinning. No hydronephrosis.  Left Kidney: Length: 10 cm. Mild cortical thinning. No hydronephrosis. No definitive mass.  Abdominal aorta: No aneurysm visualized. The distal aorta is obscured by bowel gas.  IMPRESSION: Intra and extrahepatic bile duct enlargement post cholecystectomy. Given liver function tests, this is primarily concerning for biliary obstruction. No visible  choledocholithiasis.   Electronically Signed   By: Monte Fantasia M.D.   On: 03/26/2015 19:53   Mr Abdomen Mrcp Wo Cm  03/28/2015   CLINICAL DATA:  Biliary dilatation with elevated liver function tests.  EXAM: MRI ABDOMEN WITHOUT CONTRAST  (INCLUDING MRCP)  TECHNIQUE: Multiplanar multisequence MR imaging of the abdomen was performed. Heavily T2-weighted images of the biliary and pancreatic ducts were obtained, and three-dimensional MRCP images were rendered by post processing.  COMPARISON:  03/26/2015  FINDINGS: Despite efforts by the technologist and patient, motion artifact is present on today's exam and could not be eliminated. This reduces exam sensitivity and specificity.  Lower chest:  Unremarkable  Hepatobiliary: Moderate intrahepatic biliary dilatation with the common hepatic duct at 1.6 cm diameter in the common bile duct at 0.9 cm diameter. On image 27 of series 12 the common bile duct appears to taper in the vicinity of the ampulla without a well-defined filling defect or obvious stricture. The tapering seems conical. No clear filling defect is identified.  No significant fatty infiltration of the liver. I do not observe a focal liver lesion. The gallbladder is absent.  Pancreas: Type 1 pancreas divisum. No overt pancreatic duct dilatation or pancreatic/ peripancreatic edema currently.  Spleen: Unremarkable.  Adrenals/Urinary Tract: Bilateral small T2 hyperintense lesions of both kidneys are observed. Some of these are technically too small to characterize. None have obvious deviation from fluid signal intensity on T1 and T2 weighted images. That said, IV contrast was not utilized on today's exam and the enhancement characteristics of these lesions are not investigated.  Stomach/Bowel: Unremarkable  Vascular/Lymphatic: Unremarkable  Other: No supplemental non-categorized findings.  Musculoskeletal: Considerable dextroconvex lumbar scoliosis with some chronic appearing vertebral wedging, spondylosis,  and degenerative disc disease.  IMPRESSION: 1. Intra and extrahepatic biliary dilatation with conical tapering of the distal CBD in the vicinity of the ampulla. Although a subtle ampullary stricture cannot be excluded, no intraluminal filling defect is observed. Aside from the possibility of ampullary stricture, the biliary dilatation could represent a  physiologic response to cholecystectomy. 2. There limitations of today's exam which reduced diagnostic sensitivity and specificity. These include motion artifact (very common in the inpatient setting particularly in this age range when attempting MRCP) and the lack of IV contrast. 3. Type 1 pancreas divisum without dorsal pancreatic duct dilatation or imaging evidence of pancreatitis. 4. Small bilateral fluid signal intensity lesions of both kidneys, likely cysts, no postcontrast characterization. 5. Considerable dextroconvex lumbar scoliosis with spondylosis and degenerative disc disease.   Electronically Signed   By: Van Clines M.D.   On: 03/28/2015 08:14   Mr 3d Recon At Scanner  03/28/2015   CLINICAL DATA:  Biliary dilatation with elevated liver function tests.  EXAM: MRI ABDOMEN WITHOUT CONTRAST  (INCLUDING MRCP)  TECHNIQUE: Multiplanar multisequence MR imaging of the abdomen was performed. Heavily T2-weighted images of the biliary and pancreatic ducts were obtained, and three-dimensional MRCP images were rendered by post processing.  COMPARISON:  03/26/2015  FINDINGS: Despite efforts by the technologist and patient, motion artifact is present on today's exam and could not be eliminated. This reduces exam sensitivity and specificity.  Lower chest:  Unremarkable  Hepatobiliary: Moderate intrahepatic biliary dilatation with the common hepatic duct at 1.6 cm diameter in the common bile duct at 0.9 cm diameter. On image 27 of series 12 the common bile duct appears to taper in the vicinity of the ampulla without a well-defined filling defect or obvious  stricture. The tapering seems conical. No clear filling defect is identified.  No significant fatty infiltration of the liver. I do not observe a focal liver lesion. The gallbladder is absent.  Pancreas: Type 1 pancreas divisum. No overt pancreatic duct dilatation or pancreatic/ peripancreatic edema currently.  Spleen: Unremarkable.  Adrenals/Urinary Tract: Bilateral small T2 hyperintense lesions of both kidneys are observed. Some of these are technically too small to characterize. None have obvious deviation from fluid signal intensity on T1 and T2 weighted images. That said, IV contrast was not utilized on today's exam and the enhancement characteristics of these lesions are not investigated.  Stomach/Bowel: Unremarkable  Vascular/Lymphatic: Unremarkable  Other: No supplemental non-categorized findings.  Musculoskeletal: Considerable dextroconvex lumbar scoliosis with some chronic appearing vertebral wedging, spondylosis, and degenerative disc disease.  IMPRESSION: 1. Intra and extrahepatic biliary dilatation with conical tapering of the distal CBD in the vicinity of the ampulla. Although a subtle ampullary stricture cannot be excluded, no intraluminal filling defect is observed. Aside from the possibility of ampullary stricture, the biliary dilatation could represent a physiologic response to cholecystectomy. 2. There limitations of today's exam which reduced diagnostic sensitivity and specificity. These include motion artifact (very common in the inpatient setting particularly in this age range when attempting MRCP) and the lack of IV contrast. 3. Type 1 pancreas divisum without dorsal pancreatic duct dilatation or imaging evidence of pancreatitis. 4. Small bilateral fluid signal intensity lesions of both kidneys, likely cysts, no postcontrast characterization. 5. Considerable dextroconvex lumbar scoliosis with spondylosis and degenerative disc disease.   Electronically Signed   By: Van Clines M.D.   On:  03/28/2015 08:14    Microbiology: Recent Results (from the past 240 hour(s))  Culture, Urine     Status: None (Preliminary result)   Collection Time: 03/27/15 10:02 PM  Result Value Ref Range Status   Specimen Description URINE, RANDOM  Final   Special Requests NONE  Final   Culture   Final    NO GROWTH < 12 HOURS Performed at [facility]    Report Status  PENDING  Incomplete     Labs: Basic Metabolic Panel:  Recent Labs Lab 03/26/15 1441 03/26/15 1830 03/27/15 0415 03/28/15 0438  NA 139  --  137 140  K 5.1  --  4.3 3.7  CL 98*  --  101 106  CO2 29  --  25 25  GLUCOSE 121*  --  86 85  BUN 48*  --  48* 37*  CREATININE 3.66* 3.56* 2.99* 1.90*  CALCIUM 8.4*  --  7.5* 7.8*   Liver Function Tests:  Recent Labs Lab 03/26/15 1441 03/27/15 0415 03/28/15 0438  AST 794* 365* 172*  ALT 577* 323* 222*  ALKPHOS 261* 175* 158*  BILITOT 3.3* 1.5* 0.6  PROT 6.3* 5.0* 5.1*  ALBUMIN 3.5 2.7* 2.6*   No results for input(s): LIPASE, AMYLASE in the last 168 hours.  Recent Labs Lab 03/27/15 1230  AMMONIA 12   CBC:  Recent Labs Lab 03/26/15 1441 03/26/15 1830 03/27/15 0520  WBC 12.9* 11.2* 7.1  NEUTROABS 11.2*  --   --   HGB 11.7* 10.7* 10.2*  HCT 36.9 34.7* 31.9*  MCV 95.6 94.3 94.4  PLT 234 180 165   Cardiac Enzymes:  Recent Labs Lab 03/26/15 1441  TROPONINI 0.03   BNP: BNP (last 3 results) No results for input(s): BNP in the last 8760 hours.  ProBNP (last 3 results) No results for input(s): PROBNP in the last 8760 hours.  CBG: No results for input(s): GLUCAP in the last 168 hours.  Signed:  CHIU, Orpah Melter  [facility] 03/28/2015, 7:47 PM

## 2015-03-29 LAB — URINE CULTURE: Culture: NO GROWTH

## 2015-04-01 ENCOUNTER — Telehealth: Payer: Self-pay | Admitting: *Deleted

## 2015-04-01 ENCOUNTER — Other Ambulatory Visit: Payer: Self-pay

## 2015-04-01 NOTE — Telephone Encounter (Signed)
Daughter called to inform us of Mrs Dayley being in hospital last week and they gave her narcotics while there.  Just wanted [place] to know.

## 2015-04-02 ENCOUNTER — Ambulatory Visit: Payer: PPO | Admitting: Physical Medicine & Rehabilitation

## 2015-04-09 ENCOUNTER — Ambulatory Visit: Payer: PPO | Admitting: Physical Medicine & Rehabilitation

## 2015-04-10 ENCOUNTER — Telehealth: Payer: Self-pay | Admitting: *Deleted

## 2015-04-10 MED ORDER — DIAZEPAM 5 MG PO TABS: ORAL_TABLET | ORAL | Status: DC

## 2015-04-10 NOTE — Telephone Encounter (Signed)
[name] called because she is having a procedure Monday (hip injection and needs a valium to take prior to procedure. Called in one 5 mg tablet to [facility].

## 2015-04-15 ENCOUNTER — Ambulatory Visit (HOSPITAL_BASED_OUTPATIENT_CLINIC_OR_DEPARTMENT_OTHER): Payer: PPO | Admitting: Physical Medicine & Rehabilitation

## 2015-04-15 ENCOUNTER — Encounter: Payer: Self-pay | Admitting: Physical Medicine & Rehabilitation

## 2015-04-15 ENCOUNTER — Encounter: Payer: PPO | Attending: Physical Medicine & Rehabilitation

## 2015-04-15 VITALS — BP 156/65 | HR 95 | Resp 14

## 2015-04-15 DIAGNOSIS — Z5181 Encounter for therapeutic drug level monitoring: Secondary | ICD-10-CM

## 2015-04-15 DIAGNOSIS — G894 Chronic pain syndrome: Secondary | ICD-10-CM | POA: Diagnosis present

## 2015-04-15 DIAGNOSIS — M1612 Unilateral primary osteoarthritis, left hip: Secondary | ICD-10-CM | POA: Diagnosis not present

## 2015-04-15 DIAGNOSIS — Z79899 Other long term (current) drug therapy: Secondary | ICD-10-CM | POA: Insufficient documentation

## 2015-04-15 MED ORDER — OXYCODONE HCL 15 MG PO TABS: 15.0000 mg | ORAL_TABLET | Freq: Three times a day (TID) | ORAL | Status: DC | PRN

## 2015-04-15 MED ORDER — TRAMADOL HCL 50 MG PO TABS: 50.0000 mg | ORAL_TABLET | Freq: Four times a day (QID) | ORAL | Status: DC

## 2015-04-15 NOTE — Progress Notes (Signed)
Aspiration/Injection Procedure Note LACRETIA TINDALL [id number] Aug 07, 1924  Procedure: Injection and Left hip under Fluorscopic guidance Indications: End stage OA Left hip  Procedure Details Consent: Risks of procedure as well as the alternatives and risks of each were explained to the (patient/caregiver).  Consent for procedure obtained. Time Out: Verified patient identification, verified procedure, site/side was marked, verified correct patient position, special equipment/implants available, medications/allergies/relevent history reviewed, required imaging and test results available.  Performed   Local Anesthesia Used:Lidocaine 1% plain; 62mL Amount of Fluid Aspirated: minimal amount Character of Fluid: clear  Under fluoroscopic guidance 22-gauge 3.5 inch spinal needle was directed into the right hip joint targeting the left femoral neck midpoint at junction with femoral head. Once target was reached and bone contact was established, Omnipaque 180 2 mL demonstrated good joint outline anda solution containing 1 cc of 6 mg/cc Celestone +4 cc of 1% lidocaine were injected. Patient tolerated procedure well.   Estimated blood loss: nil  Yosef Krogh E 04/15/2015, 3:34 PM

## 2015-04-15 NOTE — Patient Instructions (Signed)
Joint Injection  Care After  Refer to this sheet in the next few days. These instructions provide you with information on caring for yourself after you have had a joint injection. Your caregiver also may give you more specific instructions. Your treatment has been planned according to current medical practices, but problems sometimes occur. Call your caregiver if you have any problems or questions after your procedure.  After any type of joint injection, it is not uncommon to experience:  · Soreness, swelling, or bruising around the injection site.  · Mild numbness, tingling, or weakness around the injection site caused by the numbing medicine used before or with the injection.  It also is possible to experience the following effects associated with the specific agent after injection:  · Iodine-based contrast agents:  ¨ Allergic reaction (itching, hives, widespread redness, and swelling beyond the injection site).  · Corticosteroids (These effects are rare.):  ¨ Allergic reaction.  ¨ Increased blood sugar levels (If you have diabetes and you notice that your blood sugar levels have increased, notify your caregiver).  ¨ Increased blood pressure levels.  ¨ Mood swings.  · Hyaluronic acid in the use of viscosupplementation.  ¨ Temporary heat or redness.  ¨ Temporary rash and itching.  ¨ Increased fluid accumulation in the injected joint.  These effects all should resolve within a day after your procedure.   HOME CARE INSTRUCTIONS  · Limit yourself to light activity the day of your procedure. Avoid lifting heavy objects, bending, stooping, or twisting.  · Take prescription or over-the-counter pain medication as directed by your caregiver.  · You may apply ice to your injection site to reduce pain and swelling the day of your procedure. Ice may be applied 03-04 times:  ¨ Put ice in a plastic bag.  ¨ Place a towel between your skin and the bag.  ¨ Leave the ice on for no longer than 15-20 minutes each time.  SEEK  IMMEDIATE MEDICAL CARE IF:   · Pain and swelling get worse rather than better or extend beyond the injection site.  · Numbness does not go away.  · Blood or fluid continues to leak from the injection site.  · You have chest pain.  · You have swelling of your face or tongue.  · You have trouble breathing or you become dizzy.  · You develop a fever, chills, or severe tenderness at the injection site that last longer than 1 day.  MAKE SURE YOU:  · Understand these instructions.  · Watch your condition.  · Get help right away if you are not doing well or if you get worse.  Document Released: 06/04/2011 Document Revised: 12/14/2011 Document Reviewed: 06/04/2011  ExitCare® Patient Information ©2015 [employer]. This information is not intended to replace advice given to you by your health care provider. Make sure you discuss any questions you have with your health care provider.

## 2015-04-15 NOTE — Progress Notes (Signed)
  [facility]   Name: Kari Martinez DOB:September 04, 1924 MRN: [id number]  Date:04/15/2015  Physician: Alysia Penna, MD    Nurse/CMA: Mancel Parsons  Allergies:  Allergies  Allergen Reactions  . Alendronate Sodium Other (See Comments)    Throat swelling  . Ketorolac Other (See Comments)    "Retained fluid in lungs"  . Ketorolac Tromethamine Other (See Comments)    FLUID RETENTION  . Mobic [Meloxicam]     Kidney decline  . Nsaids Other (See Comments)    Elevated kidney function  . Risedronate Sodium Other (See Comments)    Throat swelling and mouth swelling  . Septra [Sulfamethoxazole-Trimethoprim]     Rash  . Vioxx [Rofecoxib]     Kidney decline  . Allopurinol Rash  . Sulfa Antibiotics Rash    Consent Signed: Yes.    Is patient diabetic? No.  CBG today?   Pregnant: No. LMP: No LMP recorded. Patient is postmenopausal. (age 38-55)  Anticoagulants: no Anti-inflammatory: no Antibiotics: no  Procedure:intraarticular hip injectionm Position: Prone Start Time:3:38pm  End Time: 3:44pm  Fluoro Time: 17  RN/CMA Rolan Bucco Terria Deschepper    Time 3:15 pm 3:45pm    BP 156/65 145/71    Pulse 95 88    Respirations 14 14    O2 Sat 94 94    S/S 6 6    Pain Level 5/10 0/10     D/C home with daughter, patient A & O X 3, D/C instructions reviewed, and sits independently.

## 2015-04-16 ENCOUNTER — Other Ambulatory Visit: Payer: Self-pay | Admitting: Physical Medicine & Rehabilitation

## 2015-04-16 NOTE — Addendum Note (Signed)
Addended by: Caro Hight on: 04/16/2015 01:11 PM   Modules accepted: Orders

## 2015-04-17 ENCOUNTER — Ambulatory Visit: Payer: PPO | Admitting: Registered Nurse

## 2015-04-17 LAB — PMP ALCOHOL METABOLITE (ETG): ETGU: NEGATIVE ng/mL

## 2015-04-21 LAB — OPIATES/OPIOIDS (LC/MS-MS)
Codeine Urine: NEGATIVE ng/mL (ref <50)
Hydrocodone: NEGATIVE ng/mL (ref <50)
Hydromorphone: NEGATIVE ng/mL (ref <50)
Morphine Urine: NEGATIVE ng/mL (ref <50)
NOROXYCODONE, UR: 7933 ng/mL — AB (ref <50)
Norhydrocodone, Ur: NEGATIVE ng/mL (ref <50)
Oxycodone, ur: 6718 ng/mL — AB (ref <50)
Oxymorphone: 11149 ng/mL — AB (ref <50)

## 2015-04-21 LAB — TRAMADOL, URINE
N-DESMETHYL-CIS-TRAMADOL: 8804 ng/mL (ref <100)
TRAMADOL, URINE: 31547 ng/mL (ref <100)

## 2015-04-21 LAB — BENZODIAZEPINES (GC/LC/MS), URINE
Alprazolam metabolite (GC/LC/MS), ur confirm: NEGATIVE ng/mL (ref <25)
CLONAZEPAU: NEGATIVE ng/mL (ref <25)
Flurazepam metabolite (GC/LC/MS), ur confirm: NEGATIVE ng/mL (ref <50)
LORAZEPAMU: NEGATIVE ng/mL (ref <50)
Midazolam (GC/LC/MS), ur confirm: NEGATIVE ng/mL (ref <50)
NORDIAZEPAMU: 56 ng/mL (ref <50)
Oxazepam (GC/LC/MS), ur confirm: 168 ng/mL (ref <50)
TEMAZEPAMU: 60 ng/mL (ref <50)
TRIAZOLAMU: NEGATIVE ng/mL (ref <50)

## 2015-04-21 LAB — OXYCODONE, URINE (LC/MS-MS)
Noroxycodone, Ur: 7933 ng/mL — AB (ref <50)
Oxycodone, ur: 6718 ng/mL — AB (ref <50)
Oxymorphone: 11149 ng/mL — AB (ref <50)

## 2015-04-23 LAB — PRESCRIPTION MONITORING PROFILE (SOLSTAS)
Amphetamine/Meth: NEGATIVE ng/mL
Barbiturate Screen, Urine: NEGATIVE ng/mL
Buprenorphine, Urine: NEGATIVE ng/mL
CANNABINOID SCRN UR: NEGATIVE ng/mL
CARISOPRODOL, URINE: NEGATIVE ng/mL
CREATININE, URINE: 162.06 mg/dL (ref >20.0)
Cocaine Metabolites: NEGATIVE ng/mL
ECSTASY: NEGATIVE ng/mL
FENTANYL URINE: NEGATIVE ng/mL
METHADONE SCREEN, URINE: NEGATIVE ng/mL
Meperidine, Ur: NEGATIVE ng/mL
Nitrites, Initial: NEGATIVE ug/mL
Propoxyphene: NEGATIVE ng/mL
Tapentadol, urine: NEGATIVE ng/mL
Zolpidem, Urine: NEGATIVE ng/mL
pH, Initial: 5.3 pH (ref 4.5–8.9)

## 2015-05-01 ENCOUNTER — Other Ambulatory Visit: Payer: Self-pay | Admitting: *Deleted

## 2015-05-01 MED ORDER — TRAMADOL HCL 50 MG PO TABS: 50.0000 mg | ORAL_TABLET | Freq: Four times a day (QID) | ORAL | Status: DC

## 2015-05-13 ENCOUNTER — Encounter: Payer: Self-pay | Admitting: Registered Nurse

## 2015-05-13 ENCOUNTER — Ambulatory Visit: Payer: PPO | Admitting: Physical Medicine & Rehabilitation

## 2015-05-13 ENCOUNTER — Encounter: Payer: PPO | Attending: Physical Medicine & Rehabilitation | Admitting: Registered Nurse

## 2015-05-13 ENCOUNTER — Ambulatory Visit: Payer: PPO

## 2015-05-13 VITALS — BP 150/68 | HR 66 | Resp 14

## 2015-05-13 DIAGNOSIS — M1612 Unilateral primary osteoarthritis, left hip: Secondary | ICD-10-CM | POA: Diagnosis not present

## 2015-05-13 DIAGNOSIS — G894 Chronic pain syndrome: Secondary | ICD-10-CM

## 2015-05-13 DIAGNOSIS — Z5181 Encounter for therapeutic drug level monitoring: Secondary | ICD-10-CM

## 2015-05-13 DIAGNOSIS — Z79899 Other long term (current) drug therapy: Secondary | ICD-10-CM

## 2015-05-13 MED ORDER — OXYCODONE HCL 15 MG PO TABS: 15.0000 mg | ORAL_TABLET | Freq: Three times a day (TID) | ORAL | Status: DC | PRN

## 2015-05-13 NOTE — Progress Notes (Signed)
Subjective:    Patient ID: Kari Martinez, female    DOB: 01-05-1924, 81 y.o.   MRN: [id number]  HPI: [name] is a 81 year old female who returns for follow up for chronic pain and medication refill. She says her pain is located in her left hip which radiates into her left groin. She rates her pain 5. Her current exercise regime is performing chair exercises and walking in her home with her walker.   Pain Inventory Average Pain 4 Pain Right Now 5 My pain is intermittent, sharp, stabbing and tingling  In the last 24 hours, has pain interfered with the following? General activity 4 Relation with others 7 Enjoyment of life 7 What TIME of day is your pain at its worst? daytime Sleep (in general) Fair  Pain is worse with: walking and standing Pain improves with: rest, heat/ice, medication and injections Relief from Meds: 6  Mobility walk without assistance how many minutes can you walk? 5-10 ability to climb steps?  yes do you drive?  yes  Function retired  Neuro/Psych tingling trouble walking  Prior Studies Any changes since last visit?  no  Physicians involved in your care Any changes since last visit?  no   Family History  Problem Relation Age of Onset  . Heart disease Father    History   Social History  . Marital Status: Widowed    Spouse Name: N/A  . Number of Children: N/A  . Years of Education: N/A   Social History Main Topics  . Smoking status: Never Smoker   . Smokeless tobacco: Never Used  . Alcohol Use: No  . Drug Use: No  . Sexual Activity: No   Other Topics Concern  . None   Social History Narrative   Past Surgical History  Procedure Laterality Date  . Breast lumpectomy    . Abdominal hysterectomy      Ovaries intact  . Thyroid surgery      Partial, left lobe  . Knee surgery      left torn meniscus  . Cholecystectomy    . Tonsillectomy    . Carpal tunnel release      right  . Lump in breast      Left Benign  .  Gallbladder surgery    . Meniscus repair    . Radiofrequency ablation nerves      by Dr. Letta Pate  . Cataract extraction, bilateral      and lens implants Dr. Katy Fitch 2010  . Cholecystectomy    . Dilation and curettage of uterus     Past Medical History  Diagnosis Date  . Hypertension   . Arthritis   . Spinal stenosis     lumbar, bulging disc, receiving injections 01-01-2011,02-12-2011, Dr. Letta Pate (pain management)  . Left humeral fracture Category 01/23/2012  . Osteoarthritis   . Hyperlipidemia   . Thyroid disease     Hypo  . Osteopenia   . Chronic renal insufficiency, stage III (moderate)   . Nephrosclerosis     prn, Dr. Moshe Cipro  . Gout     Dr. Ouida Sills Rheumatologist   BP 150/68 mmHg  Pulse 66  Resp 14  SpO2 97%  Opioid Risk Score:   Fall Risk Score:  `1  Depression screen PHQ 2/9  No flowsheet data found.   Review of Systems  Constitutional: Negative.   HENT: Negative.   Eyes: Negative.   Respiratory: Negative.   Cardiovascular: Negative.   Gastrointestinal: Negative.  Endocrine: Negative.   Genitourinary: Negative.   Musculoskeletal: Positive for myalgias, back pain and arthralgias.  Skin: Negative.   Allergic/Immunologic: Negative.   Neurological:       Tingling, trouble walking  Hematological: Negative.   Psychiatric/Behavioral: Negative.        Objective:   Physical Exam  Constitutional: She is oriented to person, place, and time. She appears well-developed and well-nourished.  HENT:  Head: Normocephalic and atraumatic.  Neck: Normal range of motion. Neck supple.  Cardiovascular: Normal rate and regular rhythm.   Pulmonary/Chest: Effort normal and breath sounds normal.  Musculoskeletal:  Normal Muscle Bulk and Muscle Testing Reveals: Upper Extremities: Right: Full ROM and Muscle Strength 5/5 Left: Decreased ROM 45 Degrees and Muscle Strength 5/5 Back without spinal or paraspinal tenderness Lower Extremities: Full ROM and Muscle  Strength 5/5 Left Lower Extremity Flexion Produces Pain into left lower extremity laterally. Arises from chair with ease use walker for support Narrow Based Gait  Neurological: She is alert and oriented to person, place, and time.  Skin: Skin is warm and dry.  Psychiatric: She has a normal mood and affect.  Nursing note and vitals reviewed.         Assessment & Plan:  1. Lumbar spinal stenosis with right L3-4 radiculitis sensory only: Encouraged to increase activity and use heat therapy.  2. Left greater than right hip osteoarthritis: Continue with exercise and voltaren gel. S/P Left Hip Injection with Dr. Letta Pate good relief noted for a few weeks. 3. Chronic pain syndrome: Good Relief with current medication regime  4.Left Knee Osteoarthritis:  Refilled: Oxycodone 15 mg one tablet 3 times a day #90 and Tramadol 50 mg 1 tablet 4 times a day #120.  20 minutes of face to face patient care time was spent during this visit. All questions were encouraged and answered.   F/u in 1 month

## 2015-05-19 ENCOUNTER — Other Ambulatory Visit: Payer: Self-pay | Admitting: Physical Medicine & Rehabilitation

## 2015-06-11 ENCOUNTER — Encounter: Payer: PPO | Attending: Physical Medicine & Rehabilitation | Admitting: Registered Nurse

## 2015-06-11 ENCOUNTER — Encounter: Payer: Self-pay | Admitting: Registered Nurse

## 2015-06-11 VITALS — BP 136/66 | HR 93

## 2015-06-11 DIAGNOSIS — Z79899 Other long term (current) drug therapy: Secondary | ICD-10-CM | POA: Insufficient documentation

## 2015-06-11 DIAGNOSIS — G894 Chronic pain syndrome: Secondary | ICD-10-CM

## 2015-06-11 DIAGNOSIS — Z5181 Encounter for therapeutic drug level monitoring: Secondary | ICD-10-CM | POA: Insufficient documentation

## 2015-06-11 DIAGNOSIS — M1612 Unilateral primary osteoarthritis, left hip: Secondary | ICD-10-CM | POA: Diagnosis not present

## 2015-06-11 MED ORDER — OXYCODONE HCL 15 MG PO TABS: 15.0000 mg | ORAL_TABLET | Freq: Three times a day (TID) | ORAL | Status: DC | PRN

## 2015-06-11 NOTE — Progress Notes (Signed)
Subjective:    Patient ID: Kari Martinez, female    DOB: 1923-12-26, 81 y.o.   MRN: [id number]  HPI: [name] is a 81 year old female who returns for follow up for chronic pain and medication refill. She says her pain is located in her left hip which radiates into her left groin into her left knee. She rates her pain 6. Her current exercise regime is performing chair exercises and walking in her home with her walker.  Pain Inventory Average Pain 6 Pain Right Now na My pain is intermittent, sharp, stabbing and aching  In the last 24 hours, has pain interfered with the following? General activity 6 Relation with others 7 Enjoyment of life 7 What TIME of day is your pain at its worst? morning, daytime Sleep (in general) Fair  Pain is worse with: walking and standing Pain improves with: rest, heat/ice, medication and injections Relief from Meds: 7  Mobility use a walker how many minutes can you walk? 5-10 do you drive?  yes  Function retired I need assistance with the following:  household duties and shopping  Neuro/Psych trouble walking  Prior Studies na  Physicians involved in your care Any changes since last visit?  no   Family History  Problem Relation Age of Onset  . Heart disease Father    Social History   Social History  . Marital Status: Widowed    Spouse Name: N/A  . Number of Children: N/A  . Years of Education: N/A   Social History Main Topics  . Smoking status: Never Smoker   . Smokeless tobacco: Never Used  . Alcohol Use: No  . Drug Use: No  . Sexual Activity: No   Other Topics Concern  . None   Social History Narrative   Past Surgical History  Procedure Laterality Date  . Breast lumpectomy    . Abdominal hysterectomy      Ovaries intact  . Thyroid surgery      Partial, left lobe  . Knee surgery      left torn meniscus  . Cholecystectomy    . Tonsillectomy    . Carpal tunnel release      right  . Lump in breast      Left  Benign  . Gallbladder surgery    . Meniscus repair    . Radiofrequency ablation nerves      by Dr. Letta Pate  . Cataract extraction, bilateral      and lens implants Dr. Katy Fitch 2010  . Cholecystectomy    . Dilation and curettage of uterus     Past Medical History  Diagnosis Date  . Hypertension   . Arthritis   . Spinal stenosis     lumbar, bulging disc, receiving injections 01-01-2011,02-12-2011, Dr. Letta Pate (pain management)  . Left humeral fracture Category 01/23/2012  . Osteoarthritis   . Hyperlipidemia   . Thyroid disease     Hypo  . Osteopenia   . Chronic renal insufficiency, stage III (moderate)   . Nephrosclerosis     prn, Dr. Moshe Cipro  . Gout     Dr. Ouida Sills Rheumatologist   BP 136/66 mmHg  Pulse 93  SpO2 94%  Opioid Risk Score:   Fall Risk Score:  `1  Depression screen PHQ 2/9  Depression screen PHQ 2/9 06/11/2015  Decreased Interest 0  Down, Depressed, Hopeless 0  PHQ - 2 Score 0     Review of Systems  All other systems reviewed and  are negative.      Objective:   Physical Exam  Constitutional: She is oriented to person, place, and time. She appears well-developed and well-nourished.  HENT:  Head: Normocephalic and atraumatic.  Neck: Normal range of motion. Neck supple.  Cardiovascular: Normal rate and regular rhythm.   Pulmonary/Chest: Effort normal and breath sounds normal.  Musculoskeletal:  Normal Muscle Bulk and Muscle Testing Reveals: Upper Extremities: Right: Full ROM and Muscle Strength 5/5 Left: Decreased ROM 45 Degrees and Muscle Strength 4/5 Lumbar Paraspinal Tenderness: L-3- L-4 Left Greater Trochanteric Tenderness Lower Extremities: Full ROM and Muscle Strength 5/5 Arises from chair slowly using walker for support Narrow Based gait   Neurological: She is alert and oriented to person, place, and time.  Skin: Skin is warm and dry.  Psychiatric: She has a normal mood and affect.  Nursing note and vitals reviewed.          Assessment & Plan:  1. Lumbar spinal stenosis with right L3-4 radiculitis sensory only: Encouraged to increase activity and use heat therapy.  2. Left greater than right hip osteoarthritis: Continue with exercise and voltaren gel.  3. Chronic pain syndrome: Good Relief with current medication regime  4.Left Knee Osteoarthritis:  Refilled: Oxycodone 15 mg one tablet 3 times a day #90 and Tramadol 50 mg 1 tablet 4 times a day #120.  20 minutes of face to face patient care time was spent during this visit. All questions were encouraged and answered.   F/u in 1 month

## 2015-07-09 ENCOUNTER — Encounter: Payer: Self-pay | Admitting: Registered Nurse

## 2015-07-09 ENCOUNTER — Encounter: Payer: PPO | Attending: Physical Medicine & Rehabilitation | Admitting: Registered Nurse

## 2015-07-09 VITALS — BP 141/57 | HR 102

## 2015-07-09 DIAGNOSIS — G894 Chronic pain syndrome: Secondary | ICD-10-CM | POA: Insufficient documentation

## 2015-07-09 DIAGNOSIS — Z5181 Encounter for therapeutic drug level monitoring: Secondary | ICD-10-CM | POA: Insufficient documentation

## 2015-07-09 DIAGNOSIS — Z79899 Other long term (current) drug therapy: Secondary | ICD-10-CM | POA: Diagnosis not present

## 2015-07-09 DIAGNOSIS — M1612 Unilateral primary osteoarthritis, left hip: Secondary | ICD-10-CM | POA: Diagnosis not present

## 2015-07-09 MED ORDER — OXYCODONE HCL 15 MG PO TABS: 15.0000 mg | ORAL_TABLET | Freq: Three times a day (TID) | ORAL | Status: DC | PRN

## 2015-07-09 NOTE — Progress Notes (Signed)
Subjective:    Patient ID: Kari Martinez, female    DOB: [birth year], 81 y.o.   MRN: [id number]  HPI: Kari Martinez is a 81 year old female who returns for follow up for chronic pain and medication refill. She says her pain is located in her lower back radiating into her left lower extremity anterioraly and into her left groin and into her left knee. She rates her pain 5. Her current exercise regime is performing chair exercises and walking in her home with her walker. Daughter in room.  Pain Inventory Average Pain 5 Pain Right Now 5 My pain is intermittent and sharp  In the last 24 hours, has pain interfered with the following? General activity 6 Relation with others 7 Enjoyment of life 7 What TIME of day is your pain at its worst? Morning and daytime Sleep (in general) Fair  Pain is worse with: walking and standing Pain improves with: rest, heat/ice and medication Relief from Meds: 6  Mobility use a cane how many minutes can you walk? 5-10 ability to climb steps?  yes do you drive?  yes  Function retired I need assistance with the following:  household duties  Neuro/Psych trouble walking  Prior Studies Any changes since last visit?  no  Physicians involved in your care Any changes since last visit?  no   Family History  Problem Relation Age of Onset  . Heart disease Father    Social History   Social History  . Marital Status: Widowed    Spouse Name: N/A  . Number of Children: N/A  . Years of Education: N/A   Social History Main Topics  . Smoking status: Never Smoker   . Smokeless tobacco: Never Used  . Alcohol Use: No  . Drug Use: No  . Sexual Activity: No   Other Topics Concern  . None   Social History Narrative   Past Surgical History  Procedure Laterality Date  . Breast lumpectomy    . Abdominal hysterectomy      Ovaries intact  . Thyroid surgery      Partial, left lobe  . Knee surgery      left torn meniscus  . Cholecystectomy    .  Tonsillectomy    . Carpal tunnel release      right  . Lump in breast      Left Benign  . Gallbladder surgery    . Meniscus repair    . Radiofrequency ablation nerves      by Dr. Letta Pate  . Cataract extraction, bilateral      and lens implants Dr. Katy Fitch 2010  . Cholecystectomy    . Dilation and curettage of uterus     Past Medical History  Diagnosis Date  . Hypertension   . Arthritis   . Spinal stenosis     lumbar, bulging disc, receiving injections 01-01-2011,02-12-2011, Dr. Letta Pate (pain management)  . Left humeral fracture Category 01/23/2012  . Osteoarthritis   . Hyperlipidemia   . Thyroid disease     Hypo  . Osteopenia   . Chronic renal insufficiency, stage III (moderate)   . Nephrosclerosis     prn, Dr. Moshe Cipro  . Gout     Dr. Ouida Sills Rheumatologist   BP 141/57 mmHg  Pulse 102  SpO2 94%  Opioid Risk Score:   Fall Risk Score:  `1  Depression screen PHQ 2/9  Depression screen PHQ 2/9 06/11/2015  Decreased Interest 0  Down, Depressed, Hopeless 0  PHQ - 2 Score 0      Review of Systems     Objective:   Physical Exam  Constitutional: She is oriented to person, place, and time. She appears well-developed and well-nourished.  HENT:  Head: Normocephalic and atraumatic.  Neck: Normal range of motion. Neck supple.  Cardiovascular: Normal rate and regular rhythm.   Pulmonary/Chest: Effort normal and breath sounds normal.  Musculoskeletal:  Normal Muscle Bulk and Muscle Testing Reveals: Upper Extremities: Right: Full ROM and Muscle Strength 5/5. Left: Decreased ROM 45 Degrees and Muscle Strength 5/5 Lumbar Paraspinal Tenderness: L-4- L-5 Mainly Left Side Left Greater Trochanteric tenderness Lower Extremities: Full ROM and Muscle Strength 5/5 Left Lower Extremity Flexion Produces Pain into Patella Arises from chair slowly Using cadillac walker for support Antalgic gait    Neurological: She is alert and oriented to person, place, and time.  Skin:  Skin is warm and dry.  Psychiatric: She has a normal mood and affect.  Nursing note and vitals reviewed.         Assessment & Plan:  1. Lumbar spinal stenosis with right L3-4 radiculitis sensory only: Encouraged to increase activity and use heat therapy.  2. Left greater than right hip osteoarthritis: Continue with exercise and voltaren gel.  3. Chronic pain syndrome: Good Relief with current medication regime  4.Left Knee Osteoarthritis:  Refilled: Oxycodone 15 mg one tablet 3 times a day #90 and Tramadol 50 mg 1 tablet 4 times a day #120.  20 minutes of face to face patient care time was spent during this visit. All questions were encouraged and answered.   F/u in 1 month

## 2015-08-06 ENCOUNTER — Telehealth: Payer: Self-pay | Admitting: Registered Nurse

## 2015-08-06 MED ORDER — OXYCODONE HCL 15 MG PO TABS: 15.0000 mg | ORAL_TABLET | Freq: Three times a day (TID) | ORAL | Status: DC | PRN

## 2015-08-06 NOTE — Telephone Encounter (Signed)
Ms. Lascola called the office today, her daughter Warnell Bureau passed away. Emotional support given. The Remer [facility]o is 08/07/2015 her appointment has been cancelled and rescheduled for 09/09/2015. She verbalizes understanding. Her Son Joesphine Schemm will pick up her prescription today she verbalizes understanding.

## 2015-08-07 ENCOUNTER — Encounter: Payer: PPO | Admitting: Registered Nurse

## 2015-09-09 ENCOUNTER — Encounter: Payer: PPO | Attending: Physical Medicine & Rehabilitation | Admitting: Registered Nurse

## 2015-09-09 ENCOUNTER — Encounter: Payer: Self-pay | Admitting: Registered Nurse

## 2015-09-09 VITALS — BP 177/92 | HR 98

## 2015-09-09 DIAGNOSIS — M1612 Unilateral primary osteoarthritis, left hip: Secondary | ICD-10-CM | POA: Diagnosis not present

## 2015-09-09 DIAGNOSIS — G894 Chronic pain syndrome: Secondary | ICD-10-CM | POA: Insufficient documentation

## 2015-09-09 DIAGNOSIS — Z5181 Encounter for therapeutic drug level monitoring: Secondary | ICD-10-CM | POA: Insufficient documentation

## 2015-09-09 DIAGNOSIS — I158 Other secondary hypertension: Secondary | ICD-10-CM

## 2015-09-09 DIAGNOSIS — Z79899 Other long term (current) drug therapy: Secondary | ICD-10-CM | POA: Insufficient documentation

## 2015-09-09 MED ORDER — OXYCODONE HCL 15 MG PO TABS: 15.0000 mg | ORAL_TABLET | Freq: Three times a day (TID) | ORAL | Status: DC | PRN

## 2015-09-09 NOTE — Progress Notes (Signed)
Subjective:    Patient ID: Kari Martinez, female    DOB: [birth year], 81 y.o.   MRN: [id number]  HPI: Kari Martinez is a 81 year old female who returns for follow up for chronic pain and medication refill. She says her pain is located in her left hip radiating into her lower extremity laterally into her knee.She rates her pain 5. Her current exercise regime is performing chair exercises and walking in her home with her walker.  Kari Martinez arrived to office hypertensive 194/92 blood pressure was rechecked several times 177/92, 175/94 and 176/86. She states she received a call from Dr. Moshe Cipro office to discontinued her Avapro. I placed a call to Dr. Moshe Cipro office they stated" they didn't call Kari Martinez they sent a message to her PCP Dr. Carol Ada and suggested they would discontinued her Avapro. I placed a call to Dr. Carol Ada she stated Kari Martinez GFR was 20 and her office called and discontinued the Avapro. She will prescribed amlodipine, Kari Martinez and verbalizes understanding.   Pain Inventory Average Pain 5 Pain Right Now 5 My pain is intermittent, dull and stabbing  In the last 24 hours, has pain interfered with the following? General activity 5 Relation with others 6 Enjoyment of life 7 What TIME of day is your pain at its worst? morning, daytime Sleep (in general) Fair  Pain is worse with: walking and standing Pain improves with: rest and medication Relief from Meds: 7  Mobility walk with assistance use a walker how many minutes can you walk? 5-10 ability to climb steps?  yes do you drive?  yes  Function retired I need assistance with the following:  meal prep, household duties and shopping  Neuro/Psych trouble walking  Prior Studies Any changes since last visit?  no  Physicians involved in your care Any changes since last visit?  no   Family History  Problem Relation Age of Onset  . Heart disease Father    Social History   Social  History  . Marital Status: Widowed    Spouse Name: N/A  . Number of Children: N/A  . Years of Education: N/A   Social History Main Topics  . Smoking status: Never Smoker   . Smokeless tobacco: Never Used  . Alcohol Use: No  . Drug Use: No  . Sexual Activity: No   Other Topics Concern  . None   Social History Narrative   Past Surgical History  Procedure Laterality Date  . Breast lumpectomy    . Abdominal hysterectomy      Ovaries intact  . Thyroid surgery      Partial, left lobe  . Knee surgery      left torn meniscus  . Cholecystectomy    . Tonsillectomy    . Carpal tunnel release      right  . Lump in breast      Left Benign  . Gallbladder surgery    . Meniscus repair    . Radiofrequency ablation nerves      by Dr. Letta Pate  . Cataract extraction, bilateral      and lens implants Dr. Katy Fitch 2010  . Cholecystectomy    . Dilation and curettage of uterus     Past Medical History  Diagnosis Date  . Hypertension   . Arthritis   . Spinal stenosis     lumbar, bulging disc, receiving injections 01-01-2011,02-12-2011, Dr. Letta Pate (pain management)  . Left humeral fracture Category 01/23/2012  .  Osteoarthritis   . Hyperlipidemia   . Thyroid disease     Hypo  . Osteopenia   . Chronic renal insufficiency, stage III (moderate)   . Nephrosclerosis     prn, Dr. Moshe Cipro  . Gout     Dr. Ouida Sills Rheumatologist   BP 177/92 mmHg  Pulse 98  SpO2 95%  Opioid Risk Score:   Fall Risk Score:  `1  Depression screen PHQ 2/9  Depression screen PHQ 2/9 06/11/2015  Decreased Interest 0  Down, Depressed, Hopeless 0  PHQ - 2 Score 0     Review of Systems  Musculoskeletal: Positive for gait problem.  All other systems reviewed and are negative.      Objective:   Physical Exam  Constitutional: She is oriented to person, place, and time. She appears well-developed and well-nourished.  HENT:  Head: Normocephalic and atraumatic.  Neck: Normal range of motion.  Neck supple.  Cardiovascular: Normal rate and regular rhythm.   Pulmonary/Chest: Effort normal and breath sounds normal.  Musculoskeletal:  Normal Muscle Bulk and Muscle Testing Reveals: Upper Extremities: Right: Full ROM and Muscle Strength 5/5 Left: Decreased ROM 45 Degrees and Muscle Strength 4/5 Left Greater Trochanteric Tenderness Left Gluteal Maximus Tenderness Lower Extremities: Full ROM and Muscle Strength 5/5 Arises from chair slolwy using walker for support Narrow Based gait   Neurological: She is alert and oriented to person, place, and time.  Skin: Skin is warm and dry.  Psychiatric: She has a normal mood and affect.  Nursing note and vitals reviewed.         Assessment & Plan:  1. Lumbar spinal stenosis with right L3-4 radiculitis sensory only: Encouraged to increase activity and use heat therapy.  2. Left greater than right hip osteoarthritis: Continue with exercise and voltaren gel.  3. Chronic pain syndrome: Good Relief with current medication regime  4.Left Knee Osteoarthritis:  Refilled: Oxycodone 15 mg one tablet 3 times a day #90 and Tramadol 50 mg 1 tablet 4 times a day #120. 5. HTN: PCP prescribed amlodipine  60 minutes of face to face patient care time was spent during this visit. All questions were encouraged and answered.   F/u in 1 month

## 2015-09-16 ENCOUNTER — Other Ambulatory Visit: Payer: Self-pay | Admitting: Physical Medicine & Rehabilitation

## 2015-10-01 ENCOUNTER — Other Ambulatory Visit: Payer: Self-pay | Admitting: Physical Medicine & Rehabilitation

## 2015-10-11 ENCOUNTER — Telehealth: Payer: Self-pay

## 2015-10-11 MED ORDER — OXYCODONE HCL 15 MG PO TABS: 15.0000 mg | ORAL_TABLET | Freq: Three times a day (TID) | ORAL | Status: DC | PRN

## 2015-10-11 NOTE — Telephone Encounter (Signed)
Pt needs refill on her Oxycodone. Pt states that she will run out on 10/15/14. I verified with pt that she is able to wait until Wednesday, the latest, to pick up scripts. She states that she will be okay until then. I made pt aware of potential closings and delays due to weather. Rx's printed and left for ET to sign.

## 2015-10-11 NOTE — Telephone Encounter (Signed)
ET signed and placed up front for pick up.

## 2015-10-14 ENCOUNTER — Encounter: Payer: PPO | Admitting: Registered Nurse

## 2015-10-23 ENCOUNTER — Encounter: Payer: Self-pay | Admitting: Registered Nurse

## 2015-10-23 ENCOUNTER — Encounter: Payer: PPO | Attending: Physical Medicine & Rehabilitation | Admitting: Registered Nurse

## 2015-10-23 VITALS — BP 165/66 | HR 100

## 2015-10-23 DIAGNOSIS — M4806 Spinal stenosis, lumbar region: Secondary | ICD-10-CM

## 2015-10-23 DIAGNOSIS — Z79899 Other long term (current) drug therapy: Secondary | ICD-10-CM | POA: Diagnosis not present

## 2015-10-23 DIAGNOSIS — Z5181 Encounter for therapeutic drug level monitoring: Secondary | ICD-10-CM

## 2015-10-23 DIAGNOSIS — M25552 Pain in left hip: Secondary | ICD-10-CM

## 2015-10-23 DIAGNOSIS — M1712 Unilateral primary osteoarthritis, left knee: Secondary | ICD-10-CM

## 2015-10-23 DIAGNOSIS — G894 Chronic pain syndrome: Secondary | ICD-10-CM

## 2015-10-23 DIAGNOSIS — M48061 Spinal stenosis, lumbar region without neurogenic claudication: Secondary | ICD-10-CM

## 2015-10-23 MED ORDER — OXYCODONE HCL 15 MG PO TABS: 15.0000 mg | ORAL_TABLET | Freq: Three times a day (TID) | ORAL | Status: DC | PRN

## 2015-10-23 NOTE — Progress Notes (Signed)
Subjective:    Patient ID: Kari Martinez, female    DOB: 1923/10/18, 82 y.o.   MRN: [id number]  HPI: [name] is a 82 year old female who returns for follow up for chronic pain and medication refill. She says her pain is located in her lower back radiating into her left hip.She rates her pain 4. Her current exercise regime is performing chair exercises and walking in her home with her walker.   Pain Inventory Average Pain 4 Pain Right Now 4 My pain is intermittent, dull and stabbing  In the last 24 hours, has pain interfered with the following? General activity 6 Relation with others 7 Enjoyment of life 7 What TIME of day is your pain at its worst? morning Sleep (in general) Fair  Pain is worse with: walking and standing Pain improves with: rest, heat/ice and medication Relief from Meds: 6  Mobility use a walker ability to climb steps?  yes do you drive?  yes  Function I need assistance with the following:  household duties and shopping  Neuro/Psych trouble walking  Prior Studies Any changes since last visit?  no  Physicians involved in your care Any changes since last visit?  no   Family History  Problem Relation Age of Onset  . Heart disease Father    Social History   Social History  . Marital Status: Widowed    Spouse Name: N/A  . Number of Children: N/A  . Years of Education: N/A   Social History Main Topics  . Smoking status: Never Smoker   . Smokeless tobacco: Never Used  . Alcohol Use: No  . Drug Use: No  . Sexual Activity: No   Other Topics Concern  . None   Social History Narrative   Past Surgical History  Procedure Laterality Date  . Breast lumpectomy    . Abdominal hysterectomy      Ovaries intact  . Thyroid surgery      Partial, left lobe  . Knee surgery      left torn meniscus  . Cholecystectomy    . Tonsillectomy    . Carpal tunnel release      right  . Lump in breast      Left Benign  . Gallbladder surgery    .  Meniscus repair    . Radiofrequency ablation nerves      by Dr. Letta Pate  . Cataract extraction, bilateral      and lens implants Dr. Katy Fitch 2010  . Cholecystectomy    . Dilation and curettage of uterus     Past Medical History  Diagnosis Date  . Hypertension   . Arthritis   . Spinal stenosis     lumbar, bulging disc, receiving injections 01-01-2011,02-12-2011, Dr. Letta Pate (pain management)  . Left humeral fracture Category 01/23/2012  . Osteoarthritis   . Hyperlipidemia   . Thyroid disease     Hypo  . Osteopenia   . Chronic renal insufficiency, stage III (moderate)   . Nephrosclerosis     prn, Dr. Moshe Cipro  . Gout     Dr. Ouida Sills Rheumatologist   BP 165/66 mmHg  Pulse 110  SpO2 97%  Opioid Risk Score:   Fall Risk Score:  `1  Depression screen PHQ 2/9  Depression screen [facility] 2/9 10/23/2015 06/11/2015  Decreased Interest 0 0  Down, Depressed, Hopeless 0 0  PHQ - 2 Score 0 0    Review of Systems  All other systems reviewed and are  negative.      Objective:   Physical Exam  Constitutional: She is oriented to person, place, and time. She appears well-developed and well-nourished.  HENT:  Head: Normocephalic and atraumatic.  Neck: Normal range of motion. Neck supple.  Cardiovascular: Normal rate and regular rhythm.   Pulmonary/Chest: Effort normal and breath sounds normal.  Musculoskeletal:  Normal Muscle Bulk and Muscle Testing Reveals: Upper Extremities: Right:Full ROM and Muscle Strength 5/5 Left: Decreased ROM 45 Degrees and Muscle Strength 5/5 Lumbar Paraspinal Tenderness: L-3- L-5 Mainly Left Side Left Greater Trochanteric Tenderness Lower Extremities: Full ROM and Muscle Strength 5/5 Arises from chair slowly using walker for support Antalgic gait    Neurological: She is alert and oriented to person, place, and time.  Skin: Skin is warm and dry.  Psychiatric: She has a normal mood and affect.  Nursing note and vitals reviewed.           Assessment & Plan:  1. Lumbar spinal stenosis with right L3-4 radiculitis sensory only: Encouraged to increase activity and use heat therapy.  2. Left greater than right hip osteoarthritis: Continue with exercise and voltaren gel.  3. Chronic pain syndrome: Good Relief with current medication regime  4.Left Knee Osteoarthritis:  Refilled: Oxycodone 15 mg one tablet 3 times a day #90 and Tramadol 50 mg 1 tablet 4 times a day #120.  20 minutes of face to face patient care time was spent during this visit. All questions were encouraged and answered.

## 2015-10-27 LAB — TOXASSURE SELECT,+ANTIDEPR,UR: PDF: 0

## 2015-11-01 NOTE — Progress Notes (Signed)
Urine drug screen for this encounter is consistent for prescribed medication 

## 2015-11-19 ENCOUNTER — Other Ambulatory Visit: Payer: Self-pay | Admitting: Physical Medicine & Rehabilitation

## 2015-12-04 ENCOUNTER — Encounter: Payer: PPO | Admitting: Physical Medicine & Rehabilitation

## 2015-12-06 ENCOUNTER — Encounter: Payer: PPO | Attending: Physical Medicine & Rehabilitation

## 2015-12-06 ENCOUNTER — Ambulatory Visit: Payer: PPO | Admitting: Physical Medicine & Rehabilitation

## 2015-12-06 ENCOUNTER — Ambulatory Visit (HOSPITAL_BASED_OUTPATIENT_CLINIC_OR_DEPARTMENT_OTHER): Payer: PPO | Admitting: Physical Medicine & Rehabilitation

## 2015-12-06 ENCOUNTER — Encounter: Payer: Self-pay | Admitting: Physical Medicine & Rehabilitation

## 2015-12-06 VITALS — BP 175/64 | HR 111

## 2015-12-06 DIAGNOSIS — M47816 Spondylosis without myelopathy or radiculopathy, lumbar region: Secondary | ICD-10-CM

## 2015-12-06 DIAGNOSIS — G894 Chronic pain syndrome: Secondary | ICD-10-CM | POA: Insufficient documentation

## 2015-12-06 DIAGNOSIS — M4120 Other idiopathic scoliosis, site unspecified: Secondary | ICD-10-CM

## 2015-12-06 DIAGNOSIS — Z5181 Encounter for therapeutic drug level monitoring: Secondary | ICD-10-CM | POA: Insufficient documentation

## 2015-12-06 DIAGNOSIS — M1612 Unilateral primary osteoarthritis, left hip: Secondary | ICD-10-CM

## 2015-12-06 DIAGNOSIS — Z79899 Other long term (current) drug therapy: Secondary | ICD-10-CM | POA: Insufficient documentation

## 2015-12-06 DIAGNOSIS — M419 Scoliosis, unspecified: Secondary | ICD-10-CM

## 2015-12-06 MED ORDER — OXYCODONE HCL 15 MG PO TABS: 15.0000 mg | ORAL_TABLET | Freq: Three times a day (TID) | ORAL | Status: DC | PRN

## 2015-12-06 NOTE — Patient Instructions (Signed)
Please let Kari Martinez know if you need another left hip injection

## 2015-12-06 NOTE — Progress Notes (Signed)
Subjective:    Patient ID: Kari Martinez, female    DOB: Jan 30, 1924, 82 y.o.   MRN: [id number]  HPI Patient doing relatively well in terms of her left hip pain. Last injection was in July 2016. Patient also has had right knee Synvisc injection in January 2016 which was not helpful. She continues to have low back pain more on the left side than the right side. More problems bending backwards and bending forwards.Patient lost her daughter after developing a bowel infection about 3-4 months ago.   Walks with a walker. Is able to dress and bathe herself. The patient know longer drives.No complaints of constipation Pain Inventory Average Pain 5 Pain Right Now 5 My pain is intermittent, dull and stabbing  In the last 24 hours, has pain interfered with the following? General activity 4 Relation with others 6 Enjoyment of life 7 What TIME of day is your pain at its worst? morning Sleep (in general) Fair  Pain is worse with: walking and standing Pain improves with: rest, heat/ice, medication and injections Relief from Meds: 6  Mobility use a walker ability to climb steps?  yes do you drive?  yes  Function retired I need assistance with the following:  household duties and shopping  Neuro/Psych trouble walking  Prior Studies Any changes since last visit?  no  Physicians involved in your care Any changes since last visit?  no   Family History  Problem Relation Age of Onset  . Heart disease Father    Social History   Social History  . Marital Status: Widowed    Spouse Name: N/A  . Number of Children: N/A  . Years of Education: N/A   Social History Main Topics  . Smoking status: Never Smoker   . Smokeless tobacco: Never Used  . Alcohol Use: No  . Drug Use: No  . Sexual Activity: No   Other Topics Concern  . None   Social History Narrative   Past Surgical History  Procedure Laterality Date  . Breast lumpectomy    . Abdominal hysterectomy      Ovaries intact    . Thyroid surgery      Partial, left lobe  . Knee surgery      left torn meniscus  . Cholecystectomy    . Tonsillectomy    . Carpal tunnel release      right  . Lump in breast      Left Benign  . Gallbladder surgery    . Meniscus repair    . Radiofrequency ablation nerves      by Dr. Letta Pate  . Cataract extraction, bilateral      and lens implants Dr. Katy Fitch 2010  . Cholecystectomy    . Dilation and curettage of uterus     Past Medical History  Diagnosis Date  . Hypertension   . Arthritis   . Spinal stenosis     lumbar, bulging disc, receiving injections 01-01-2011,02-12-2011, Dr. Letta Pate (pain management)  . Left humeral fracture Category 01/23/2012  . Osteoarthritis   . Hyperlipidemia   . Thyroid disease     Hypo  . Osteopenia   . Chronic renal insufficiency, stage III (moderate)   . Nephrosclerosis     prn, Dr. Moshe Cipro  . Gout     Dr. Ouida Sills Rheumatologist   BP 175/64 mmHg  Pulse 111  SpO2 97%  Opioid Risk Score:   Fall Risk Score:  `1  Depression screen PHQ 2/9  Depression screen [facility] 2/9  12/06/2015 10/23/2015 06/11/2015  Decreased Interest 0 0 0  Down, Depressed, Hopeless 0 0 0  PHQ - 2 Score 0 0 0     Review of Systems  Hematological: Bruises/bleeds easily.  All other systems reviewed and are negative.      Objective:   Physical Exam  Constitutional: She is oriented to person, place, and time. She appears well-developed and well-nourished.  HENT:  Head: Normocephalic and atraumatic.  Eyes: Conjunctivae and EOM are normal. Pupils are equal, round, and reactive to light.  Musculoskeletal:       Right hip: Normal.       Left hip: She exhibits decreased range of motion, tenderness and crepitus.       Right knee: She exhibits decreased range of motion. She exhibits no swelling and no effusion. No tenderness found.       Left knee: She exhibits decreased range of motion. She exhibits no swelling and no effusion. No tenderness found.        Thoracic back: She exhibits decreased range of motion and deformity.       Lumbar back: She exhibits decreased range of motion, deformity and pain.  Neurological: She is alert and oriented to person, place, and time.  Psychiatric: She has a normal mood and affect.  Nursing note and vitals reviewed.    Dextroconvex scoliosis in the thoracic or lumbar area. Pain in the left lumbosacral junction to palpation. Limited internal and external rotation of the left hip.     Assessment & Plan:  1. Lumbar spinal stenosis and acquired lumbar scoliosis with chronic low back pain. She has no significant radicular component. She remains functional with her narcotic analgesic medications. Continue oxycodone 15 mg 3 times a day  2. Left hip end-stage osteoarthritis with hip contracture. At this point she does not feel like it is limiting her ambulation. She does have pain with movement. She does not wish to schedule an injection at this time.  3. Knee osteoarthritis with decreased range of motion but no evidence of swelling or effusion will continue Voltaren gel. She uses this on a when necessary basis. I recommend 4 times per day  Nurse practitioner visit one month M.D. Visits 6-12 months

## 2016-01-06 ENCOUNTER — Other Ambulatory Visit: Payer: Self-pay | Admitting: Physical Medicine & Rehabilitation

## 2016-01-09 ENCOUNTER — Encounter: Payer: Self-pay | Admitting: Physical Medicine & Rehabilitation

## 2016-01-10 ENCOUNTER — Encounter: Payer: Self-pay | Admitting: Registered Nurse

## 2016-01-10 ENCOUNTER — Encounter: Payer: PPO | Attending: Physical Medicine & Rehabilitation | Admitting: Registered Nurse

## 2016-01-10 VITALS — BP 158/84 | HR 75 | Resp 14

## 2016-01-10 DIAGNOSIS — Z5181 Encounter for therapeutic drug level monitoring: Secondary | ICD-10-CM

## 2016-01-10 DIAGNOSIS — G894 Chronic pain syndrome: Secondary | ICD-10-CM

## 2016-01-10 DIAGNOSIS — Z79899 Other long term (current) drug therapy: Secondary | ICD-10-CM

## 2016-01-10 DIAGNOSIS — M25552 Pain in left hip: Secondary | ICD-10-CM | POA: Diagnosis not present

## 2016-01-10 DIAGNOSIS — M4120 Other idiopathic scoliosis, site unspecified: Secondary | ICD-10-CM | POA: Diagnosis not present

## 2016-01-10 DIAGNOSIS — M419 Scoliosis, unspecified: Secondary | ICD-10-CM

## 2016-01-10 DIAGNOSIS — M47816 Spondylosis without myelopathy or radiculopathy, lumbar region: Secondary | ICD-10-CM

## 2016-01-10 MED ORDER — OXYCODONE HCL 15 MG PO TABS: 15.0000 mg | ORAL_TABLET | Freq: Three times a day (TID) | ORAL | Status: DC | PRN

## 2016-01-10 MED ORDER — DIAZEPAM 5 MG PO TABS: ORAL_TABLET | ORAL | Status: DC

## 2016-01-10 NOTE — Progress Notes (Signed)
Subjective:    Patient ID: Kari Martinez, female    DOB: [birth year], 82 y.o.   MRN: [id number]  HPI: [name] is a 82 year old female who returns for follow up for chronic pain and medication refill. She states her pain is located in her left hip radiating into her left groin.She rates her pain 7. Her current exercise regime is performing chair exercises and walking in her home with her walker.   Pain Inventory Average Pain 7 Pain Right Now 7 My pain is intermittent, sharp and stabbing  In the last 24 hours, has pain interfered with the following? General activity 6 Relation with others 6 Enjoyment of life 7 What TIME of day is your pain at its worst? morning, daytime  Sleep (in general) Fair  Pain is worse with: walking and standing Pain improves with: rest and medication Relief from Meds: 6  Mobility use a walker how many minutes can you walk? 5-10 ability to climb steps?  yes do you drive?  yes  Function retired I need assistance with the following:  household duties  Neuro/Psych trouble walking  Prior Studies Any changes since last visit?  no  Physicians involved in your care Any changes since last visit?  no   Family History  Problem Relation Age of Onset  . Heart disease Father    Social History   Social History  . Marital Status: Widowed    Spouse Name: N/A  . Number of Children: N/A  . Years of Education: N/A   Social History Main Topics  . Smoking status: Never Smoker   . Smokeless tobacco: Never Used  . Alcohol Use: No  . Drug Use: No  . Sexual Activity: No   Other Topics Concern  . None   Social History Narrative   Past Surgical History  Procedure Laterality Date  . Breast lumpectomy    . Abdominal hysterectomy      Ovaries intact  . Thyroid surgery      Partial, left lobe  . Knee surgery      left torn meniscus  . Cholecystectomy    . Tonsillectomy    . Carpal tunnel release      right  . Lump in breast      Left  Benign  . Gallbladder surgery    . Meniscus repair    . Radiofrequency ablation nerves      by Dr. Letta Pate  . Cataract extraction, bilateral      and lens implants Dr. Katy Fitch 2010  . Cholecystectomy    . Dilation and curettage of uterus     Past Medical History  Diagnosis Date  . Hypertension   . Arthritis   . Spinal stenosis     lumbar, bulging disc, receiving injections 01-01-2011,02-12-2011, Dr. Letta Pate (pain management)  . Left humeral fracture Category 01/23/2012  . Osteoarthritis   . Hyperlipidemia   . Thyroid disease     Hypo  . Osteopenia   . Chronic renal insufficiency, stage III (moderate)   . Nephrosclerosis     prn, Dr. Moshe Cipro  . Gout     Dr. Ouida Sills Rheumatologist   BP 158/84 mmHg  Pulse 75  Resp 14  SpO2 95%  Opioid Risk Score:   Fall Risk Score:  `1  Depression screen PHQ 2/9  Depression screen [facility] 2/9 12/06/2015 10/23/2015 06/11/2015  Decreased Interest 0 0 0  Down, Depressed, Hopeless 0 0 0  PHQ - 2 Score 0 0  0     Review of Systems  Musculoskeletal: Positive for gait problem.  All other systems reviewed and are negative.      Objective:   Physical Exam  Constitutional: She is oriented to person, place, and time. She appears well-developed and well-nourished.  HENT:  Head: Normocephalic and atraumatic.  Neck: Normal range of motion. Neck supple.  Cardiovascular: Normal rate and regular rhythm.   Pulmonary/Chest: Effort normal and breath sounds normal.  Musculoskeletal:  Normal Muscle Bulk and Muscle Testing Reveals:  Upper Extremities: Right: Full ROM and Muscle Strength 5/5 Left: Decreased ROM 45 Degrees and Muscle Strength 5/5  Left Greater Trochanteric Tenderness Lower Extremities: Full ROM and Muscle Strength 5/5 Left Lower Extremity Flexion Produces Pain into Left hip Arises from chair slowly using walker for support Antalgic Gait  Neurological: She is alert and oriented to person, place, and time.  Skin: Skin is warm and  dry.  Psychiatric: She has a normal mood and affect.  Nursing note and vitals reviewed.         Assessment & Plan:  1. Lumbar spinal stenosis with right L3-4 radiculitis sensory only: Encouraged to increase activity and use heat therapy.  2. Left greater than right hip osteoarthritis: Continue with exercise and voltaren gel.  Scheduled Left Hip Injection. 3. Chronic pain syndrome: Good Relief with current medication regime  4.Left Knee Osteoarthritis:  Refilled: Oxycodone 15 mg one tablet 3 times a day #90 and Tramadol 50 mg 1 tablet 4 times a day #120.  We will continue the opioid monitoring program, this consists of regular clinic visits, examinations, urine drug screen, pill counts as well as use of [place] Controlled Substance Reporting System.  20 minutes of face to face patient care time was spent during this visit. All questions were encouraged and answered.

## 2016-02-04 ENCOUNTER — Encounter: Payer: PPO | Attending: Physical Medicine & Rehabilitation

## 2016-02-04 ENCOUNTER — Encounter: Payer: Self-pay | Admitting: Physical Medicine & Rehabilitation

## 2016-02-04 ENCOUNTER — Ambulatory Visit (HOSPITAL_BASED_OUTPATIENT_CLINIC_OR_DEPARTMENT_OTHER): Payer: PPO | Admitting: Physical Medicine & Rehabilitation

## 2016-02-04 VITALS — BP 149/53 | HR 71 | Resp 14

## 2016-02-04 DIAGNOSIS — Z5181 Encounter for therapeutic drug level monitoring: Secondary | ICD-10-CM | POA: Diagnosis present

## 2016-02-04 DIAGNOSIS — G894 Chronic pain syndrome: Secondary | ICD-10-CM | POA: Insufficient documentation

## 2016-02-04 DIAGNOSIS — M1612 Unilateral primary osteoarthritis, left hip: Secondary | ICD-10-CM | POA: Diagnosis not present

## 2016-02-04 DIAGNOSIS — Z79899 Other long term (current) drug therapy: Secondary | ICD-10-CM | POA: Insufficient documentation

## 2016-02-04 MED ORDER — OXYCODONE HCL 15 MG PO TABS: 15.0000 mg | ORAL_TABLET | Freq: Three times a day (TID) | ORAL | Status: DC | PRN

## 2016-02-04 NOTE — Progress Notes (Signed)
Aspiration/Injection Procedure Note Kari Martinez [id number] [birth year]  Procedure: Injection and Left hip under Fluorscopic guidance Indications: End stage OA Left hip  Procedure Details Consent: Risks of procedure as well as the alternatives and risks of each were explained to the patient.  Consent for procedure obtained. Time Out: Verified patient identification, verified procedure, site/side was marked, verified correct patient position, special equipment/implants available, medications/allergies/relevent history reviewed, required imaging and test results available.  Performed   Local Anesthesia Used:Lidocaine 1% plain; 86mL Amount of Fluid Aspirated: minimal amount Character of Fluid: clear yellow  Under fluoroscopic guidance 22-gauge 3.5 inch spinal needle was directed into the right hip joint targeting the left femoral neck midpoint at junction with femoral head. Once target was reached and bone contact was established, Omnipaque 180 2 mL demonstrated good joint outline anda solution containing 1 cc of 6 mg/cc Celestone +4 cc of 1% lidocaine were injected. Patient tolerated procedure well.   Estimated blood loss: nil  Kari Martinez E 02/04/2016, 12:38 PM

## 2016-02-04 NOTE — Progress Notes (Signed)
  [facility]   Name: Kari Martinez DOB:03/29/1924 MRN: [id number]  Date:02/04/2016  Physician: Alysia Penna, MD    Nurse/CMA: Edwena Mayorga, CMA  Allergies:  Allergies  Allergen Reactions  . Alendronate Sodium Other (See Comments)    Throat swelling  . Ketorolac Other (See Comments)    "Retained fluid in lungs"  . Ketorolac Tromethamine Other (See Comments)    FLUID RETENTION  . Mobic [Meloxicam]     Kidney decline  . Nsaids Other (See Comments)    Elevated kidney function  . Risedronate Sodium Other (See Comments)    Throat swelling and mouth swelling  . Septra [Sulfamethoxazole-Trimethoprim]     Rash  . Vioxx [Rofecoxib]     Kidney decline  . Allopurinol Rash  . Sulfa Antibiotics Rash    Consent Signed: Yes.    Is patient diabetic? No.  CBG today? N/A  Pregnant: No. LMP: No LMP recorded. Patient is postmenopausal. (age 11-55)  Anticoagulants: no Anti-inflammatory: no Antibiotics: no  Procedure: left intra-articular hip injection Position: Supine Start Time: 12:59pm      End Time:1:02pm        Fluoro Time: 27  RN/CMA Earl Zellmer,CMA Sua Spadafora, CMA    Time 12:45pm 1:09pm    BP 149/53 175/49    Pulse 71 82    Respirations 14 14    O2 Sat 96 95    S/S 6 6    Pain Level 5/10 2/10     D/C home with Kari Martinez, patient A & O X 3, D/C instructions reviewed, and sits independently.

## 2016-02-04 NOTE — Patient Instructions (Signed)
Performed left hip joint injection, injected Isovue dye as well as Celestone and lidocaine. Monitor for bruising.  Please call if pain is increasing rather than decreasing.

## 2016-02-10 ENCOUNTER — Other Ambulatory Visit: Payer: Self-pay | Admitting: Physical Medicine & Rehabilitation

## 2016-02-29 ENCOUNTER — Other Ambulatory Visit: Payer: Self-pay | Admitting: Physical Medicine & Rehabilitation

## 2016-03-03 ENCOUNTER — Ambulatory Visit: Payer: PPO | Admitting: Registered Nurse

## 2016-03-04 ENCOUNTER — Encounter (HOSPITAL_BASED_OUTPATIENT_CLINIC_OR_DEPARTMENT_OTHER): Payer: PPO | Admitting: Registered Nurse

## 2016-03-04 ENCOUNTER — Encounter: Payer: Self-pay | Admitting: Registered Nurse

## 2016-03-04 VITALS — BP 151/68 | HR 86 | Resp 16

## 2016-03-04 DIAGNOSIS — M25562 Pain in left knee: Secondary | ICD-10-CM | POA: Diagnosis not present

## 2016-03-04 DIAGNOSIS — G894 Chronic pain syndrome: Secondary | ICD-10-CM | POA: Diagnosis not present

## 2016-03-04 DIAGNOSIS — Z5181 Encounter for therapeutic drug level monitoring: Secondary | ICD-10-CM

## 2016-03-04 DIAGNOSIS — M47816 Spondylosis without myelopathy or radiculopathy, lumbar region: Secondary | ICD-10-CM

## 2016-03-04 DIAGNOSIS — Z79899 Other long term (current) drug therapy: Secondary | ICD-10-CM | POA: Diagnosis not present

## 2016-03-04 DIAGNOSIS — M25552 Pain in left hip: Secondary | ICD-10-CM

## 2016-03-04 MED ORDER — OXYCODONE HCL 15 MG PO TABS: 15.0000 mg | ORAL_TABLET | Freq: Three times a day (TID) | ORAL | Status: DC | PRN

## 2016-03-04 NOTE — Progress Notes (Signed)
Subjective:    Patient ID: Kari Martinez, female    DOB: 09/05/1924, 82 y.o.   MRN: [id number]  HPI: [name] is a 82 year old female who returns for follow up for chronic pain and medication refill. She states her pain is located in her left buttock radiating into her left hip and left knee.She rates her pain 6. Her current exercise regime is walking in her home with her walker.  S/P Left Hip Injection with good relief noted.  Pain Inventory Average Pain 4 Pain Right Now 6 My pain is intermittent and stabbing  In the last 24 hours, has pain interfered with the following? General activity 5 Relation with others 6 Enjoyment of life 4 What TIME of day is your pain at its worst? daytime Sleep (in general) Good  Pain is worse with: walking and standing Pain improves with: rest, heat/ice and medication Relief from Meds: 6  Mobility use a walker how many minutes can you walk? 5 ability to climb steps?  yes do you drive?  yes  Function retired I need assistance with the following:  household duties and shopping  Neuro/Psych trouble walking  Prior Studies Any changes since last visit?  no  Physicians involved in your care Any changes since last visit?  no   Family History  Problem Relation Age of Onset  . Heart disease Father    Social History   Social History  . Marital Status: Widowed    Spouse Name: N/A  . Number of Children: N/A  . Years of Education: N/A   Social History Main Topics  . Smoking status: Never Smoker   . Smokeless tobacco: Never Used  . Alcohol Use: No  . Drug Use: No  . Sexual Activity: No   Other Topics Concern  . None   Social History Narrative   Past Surgical History  Procedure Laterality Date  . Breast lumpectomy    . Abdominal hysterectomy      Ovaries intact  . Thyroid surgery      Partial, left lobe  . Knee surgery      left torn meniscus  . Cholecystectomy    . Tonsillectomy    . Carpal tunnel release     right  . Lump in breast      Left Benign  . Gallbladder surgery    . Meniscus repair    . Radiofrequency ablation nerves      by Dr. Letta Pate  . Cataract extraction, bilateral      and lens implants Dr. Katy Fitch 2010  . Cholecystectomy    . Dilation and curettage of uterus     Past Medical History  Diagnosis Date  . Hypertension   . Arthritis   . Spinal stenosis     lumbar, bulging disc, receiving injections 01-01-2011,02-12-2011, Dr. Letta Pate (pain management)  . Left humeral fracture Category 01/23/2012  . Osteoarthritis   . Hyperlipidemia   . Thyroid disease     Hypo  . Osteopenia   . Chronic renal insufficiency, stage III (moderate)   . Nephrosclerosis     prn, Dr. Moshe Cipro  . Gout     Dr. Ouida Sills Rheumatologist   BP 151/68 mmHg  Pulse 86  Resp 16  SpO2 94%  Opioid Risk Score:   Fall Risk Score:  `1  Depression screen PHQ 2/9  Depression screen [facility] 2/9 03/04/2016 12/06/2015 10/23/2015 06/11/2015  Decreased Interest 0 0 0 0  Down, Depressed, Hopeless 0 0 0  0  PHQ - 2 Score 0 0 0 0       Review of Systems  All other systems reviewed and are negative.      Objective:   Physical Exam  Constitutional: She is oriented to person, place, and time. She appears well-developed and well-nourished.  HENT:  Head: Normocephalic and atraumatic.  Neck: Normal range of motion. Neck supple.  Cardiovascular: Normal rate and regular rhythm.   Pulmonary/Chest: Effort normal and breath sounds normal.  Musculoskeletal:  Normal Muscle Bulk and Muscle Testing Reveals: Upper Extremities: Right: Full ROM and Muscle Strength 5/5 Left: Decreased ROM 45 Degrees and Muscle Strength 5/5 Left Greater Trochanteric tenderness Lower Extremities: Full ROM and Muscle Strength 5/5 Arises from chair slowly using walker for support Antalgic gait  Neurological: She is alert and oriented to person, place, and time.  Skin: Skin is warm and dry.  Psychiatric: She has a normal mood and  affect.  Nursing note and vitals reviewed.         Assessment & Plan:  1. Lumbar spinal stenosis with right L3-4 radiculitis sensory only: Encouraged to increase activity and use heat therapy.  2. Left greater than right hip osteoarthritis: Continue with exercise and voltaren gel.  S/P Left Hip Injection with good relief noted 3. Chronic pain syndrome: Good Relief with current medication regime  4.Left Knee Osteoarthritis:  Refilled: Oxycodone 15 mg one tablet 3 times a day #90 and continue Tramadol 50 mg 1 tablet 4 times a day #120.  We will continue the opioid monitoring program, this consists of regular clinic visits, examinations, urine drug screen, pill counts as well as use of [place] Controlled Substance Reporting System.  20 minutes of face to face patient care time was spent during this visit. All questions were encouraged and answered.

## 2016-03-12 LAB — TOXASSURE SELECT,+ANTIDEPR,UR: PDF: 0

## 2016-03-25 NOTE — Progress Notes (Signed)
Urine drug screen for this encounter is consistent for prescribed medication 

## 2016-04-01 ENCOUNTER — Encounter: Payer: Self-pay | Admitting: Registered Nurse

## 2016-04-01 ENCOUNTER — Encounter: Payer: PPO | Attending: Physical Medicine & Rehabilitation | Admitting: Registered Nurse

## 2016-04-01 VITALS — BP 146/67 | HR 90 | Resp 16

## 2016-04-01 DIAGNOSIS — Z79899 Other long term (current) drug therapy: Secondary | ICD-10-CM | POA: Diagnosis not present

## 2016-04-01 DIAGNOSIS — G894 Chronic pain syndrome: Secondary | ICD-10-CM | POA: Diagnosis present

## 2016-04-01 DIAGNOSIS — M25562 Pain in left knee: Secondary | ICD-10-CM

## 2016-04-01 DIAGNOSIS — M25552 Pain in left hip: Secondary | ICD-10-CM | POA: Diagnosis not present

## 2016-04-01 DIAGNOSIS — M47816 Spondylosis without myelopathy or radiculopathy, lumbar region: Secondary | ICD-10-CM | POA: Diagnosis not present

## 2016-04-01 DIAGNOSIS — Z5181 Encounter for therapeutic drug level monitoring: Secondary | ICD-10-CM | POA: Diagnosis present

## 2016-04-01 DIAGNOSIS — M1612 Unilateral primary osteoarthritis, left hip: Secondary | ICD-10-CM

## 2016-04-01 MED ORDER — OXYCODONE HCL 15 MG PO TABS: 15.0000 mg | ORAL_TABLET | Freq: Three times a day (TID) | ORAL | Status: DC | PRN

## 2016-04-01 NOTE — Progress Notes (Signed)
Subjective:    Patient ID: Kari Martinez, female    DOB: 06/10/1924, 82 y.o.   MRN: [id number]  HPI: [name] is a 82 year old female who returns for follow up for chronic pain and medication refill. She states her pain is located in her left buttock radiating into her left hip and left knee.She rated her pain last night 0 . Her current exercise regime is walking in her home with her walker.   Pain Inventory Average Pain 4 Pain Right Now 0 My pain is intermittent and stabbing  In the last 24 hours, has pain interfered with the following? General activity 4 Relation with others 5 Enjoyment of life 6 What TIME of day is your pain at its worst? daytime Sleep (in general) Good  Pain is worse with: walking and standing Pain improves with: rest, medication and injections Relief from Meds: 7  Mobility use a walker how many minutes can you walk? 5-10 ability to climb steps?  no do you drive?  yes  Function I need assistance with the following:  household duties and shopping  Neuro/Psych trouble walking  Prior Studies Any changes since last visit?  no  Physicians involved in your care Any changes since last visit?  no   Family History  Problem Relation Age of Onset  . Heart disease Father    Social History   Social History  . Marital Status: Widowed    Spouse Name: N/A  . Number of Children: N/A  . Years of Education: N/A   Social History Main Topics  . Smoking status: Never Smoker   . Smokeless tobacco: Never Used  . Alcohol Use: No  . Drug Use: No  . Sexual Activity: No   Other Topics Concern  . None   Social History Narrative   Past Surgical History  Procedure Laterality Date  . Breast lumpectomy    . Abdominal hysterectomy      Ovaries intact  . Thyroid surgery      Partial, left lobe  . Knee surgery      left torn meniscus  . Cholecystectomy    . Tonsillectomy    . Carpal tunnel release      right  . Lump in breast      Left Benign    . Gallbladder surgery    . Meniscus repair    . Radiofrequency ablation nerves      by Dr. Letta Pate  . Cataract extraction, bilateral      and lens implants Dr. Katy Fitch 2010  . Cholecystectomy    . Dilation and curettage of uterus     Past Medical History  Diagnosis Date  . Hypertension   . Arthritis   . Spinal stenosis     lumbar, bulging disc, receiving injections 01-01-2011,02-12-2011, Dr. Letta Pate (pain management)  . Left humeral fracture Category 01/23/2012  . Osteoarthritis   . Hyperlipidemia   . Thyroid disease     Hypo  . Osteopenia   . Chronic renal insufficiency, stage III (moderate)   . Nephrosclerosis     prn, Dr. Moshe Cipro  . Gout     Dr. Ouida Sills Rheumatologist   BP 185/78 mmHg  Pulse 90  Resp 16  SpO2 94%  Opioid Risk Score:   Fall Risk Score:  `1  Depression screen PHQ 2/9  Depression screen [facility] 2/9 04/01/2016 03/04/2016 12/06/2015 10/23/2015 06/11/2015  Decreased Interest 0 0 0 0 0  Down, Depressed, Hopeless 0 0 0 0  0  PHQ - 2 Score 0 0 0 0 0       Review of Systems  Constitutional: Negative.   HENT: Negative.   Respiratory: Negative.   Cardiovascular: Negative.   Endocrine: Negative.   Genitourinary: Negative.   Musculoskeletal: Negative.   Neurological: Negative.   Hematological: Negative.   Psychiatric/Behavioral: Negative.   All other systems reviewed and are negative.      Objective:   Physical Exam  Constitutional: She is oriented to person, place, and time. She appears well-developed and well-nourished.  HENT:  Head: Normocephalic and atraumatic.  Neck: Normal range of motion. Neck supple.  Cardiovascular: Normal rate and regular rhythm.   Pulmonary/Chest: Effort normal and breath sounds normal.  Musculoskeletal:  Normal Muscle Bulk and Muscle Testing Reveals: Upper Extremities: Right: Full ROM and Muscle Strength 5/5 Left: Decreased ROM 45 Degrees and Muscle Strength 5/5 Lumbar Paraspinal Tenderness: L-4- L-5 Mainly Left  Side Lower Extremity: Full ROM and Muscle Strength 5/5 Left Lower Extremity Flexion Produces pain into Patella. Cracking heard with flexion. Arises from chair slowly using walker for support  Neurological: She is alert and oriented to person, place, and time.  Skin: Skin is warm and dry.  Psychiatric: She has a normal mood and affect.  Nursing note and vitals reviewed.         Assessment & Plan:  1. Lumbar spinal stenosis with right L3-4 radiculitis sensory only: Encouraged to increase activity and use heat therapy.  2. Left greater than right hip osteoarthritis: Continue with exercise and voltaren gel.  3. Chronic pain syndrome: Good Relief with current medication regime  4.Left Knee Osteoarthritis:  Refilled: Oxycodone 15 mg one tablet 3 times a day #90 and continue Tramadol 50 mg 1 tablet 4 times a day #120.  We will continue the opioid monitoring program, this consists of regular clinic visits, examinations, urine drug screen, pill counts as well as use of [place] Controlled Substance Reporting System.  20 minutes of face to face patient care time was spent during this visit. All questions were encouraged and answered.

## 2016-04-06 ENCOUNTER — Other Ambulatory Visit: Payer: Self-pay | Admitting: Physical Medicine & Rehabilitation

## 2016-04-28 ENCOUNTER — Encounter: Payer: PPO | Admitting: Registered Nurse

## 2016-05-01 ENCOUNTER — Encounter: Payer: PPO | Attending: Physical Medicine & Rehabilitation | Admitting: Registered Nurse

## 2016-05-01 ENCOUNTER — Encounter: Payer: Self-pay | Admitting: Registered Nurse

## 2016-05-01 VITALS — BP 146/73 | HR 69

## 2016-05-01 DIAGNOSIS — Z79899 Other long term (current) drug therapy: Secondary | ICD-10-CM

## 2016-05-01 DIAGNOSIS — M47816 Spondylosis without myelopathy or radiculopathy, lumbar region: Secondary | ICD-10-CM | POA: Diagnosis not present

## 2016-05-01 DIAGNOSIS — Z5181 Encounter for therapeutic drug level monitoring: Secondary | ICD-10-CM | POA: Diagnosis present

## 2016-05-01 DIAGNOSIS — M25511 Pain in right shoulder: Secondary | ICD-10-CM | POA: Diagnosis not present

## 2016-05-01 DIAGNOSIS — M25562 Pain in left knee: Secondary | ICD-10-CM | POA: Diagnosis not present

## 2016-05-01 DIAGNOSIS — G894 Chronic pain syndrome: Secondary | ICD-10-CM | POA: Diagnosis not present

## 2016-05-01 DIAGNOSIS — M1612 Unilateral primary osteoarthritis, left hip: Secondary | ICD-10-CM

## 2016-05-01 MED ORDER — OXYCODONE HCL 15 MG PO TABS: 15.0000 mg | ORAL_TABLET | Freq: Three times a day (TID) | ORAL | 0 refills | Status: DC | PRN

## 2016-05-01 NOTE — Progress Notes (Signed)
Subjective:    Patient ID: Kari Martinez, female    DOB: [birth year], 82 y.o.   MRN: [id number]  HPI: [name] is a 82 year old female who returns for follow up for chronic pain and medication refill. She states her pain is located in her lower back and left knee.She rates her pain 5 . Her current exercise regime is walking in her home with her walker.   Pain Inventory Average Pain 4 Pain Right Now 5 My pain is intermittent and stabbing  In the last 24 hours, has pain interfered with the following? General activity 5 Relation with others 6 Enjoyment of life 6 What TIME of day is your pain at its worst? morning, daytime  Sleep (in general) Fair  Pain is worse with: walking and standing Pain improves with: rest, heat/ice and medication Relief from Meds: 6  Mobility use a walker how many minutes can you walk? 5-10 ability to climb steps?  no do you drive?  yes  Function retired I need assistance with the following:  household duties and shopping  Neuro/Psych trouble walking  Prior Studies Any changes since last visit?  no  Physicians involved in your care Any changes since last visit?  no   Family History  Problem Relation Age of Onset  . Heart disease Father    Social History   Social History  . Marital status: Widowed    Spouse name: N/A  . Number of children: N/A  . Years of education: N/A   Social History Main Topics  . Smoking status: Never Smoker  . Smokeless tobacco: Never Used  . Alcohol use No  . Drug use: No  . Sexual activity: No   Other Topics Concern  . Not on file   Social History Narrative  . No narrative on file   Past Surgical History:  Procedure Laterality Date  . ABDOMINAL HYSTERECTOMY     Ovaries intact  . BREAST LUMPECTOMY    . CARPAL TUNNEL RELEASE     right  . CATARACT EXTRACTION, BILATERAL     and lens implants Dr. Katy Fitch 2010  . CHOLECYSTECTOMY    . CHOLECYSTECTOMY    . DILATION AND CURETTAGE OF UTERUS    .  GALLBLADDER SURGERY    . KNEE SURGERY     left torn meniscus  . LUMP IN BREAST     Left Benign  . MENISCUS REPAIR    . RADIOFREQUENCY ABLATION NERVES     by Dr. Letta Pate  . THYROID SURGERY     Partial, left lobe  . TONSILLECTOMY     Past Medical History:  Diagnosis Date  . Arthritis   . Chronic renal insufficiency, stage III (moderate)   . Gout    Dr. Ouida Sills Rheumatologist  . Hyperlipidemia   . Hypertension   . Left humeral fracture Category 01/23/2012  . Nephrosclerosis    prn, Dr. Moshe Cipro  . Osteoarthritis   . Osteopenia   . Spinal stenosis    lumbar, bulging disc, receiving injections 01-01-2011,02-12-2011, Dr. Letta Pate (pain management)  . Thyroid disease    Hypo   There were no vitals taken for this visit.  Opioid Risk Score:   Fall Risk Score:  `1  Depression screen PHQ 2/9  Depression screen [facility] 2/9 04/01/2016 03/04/2016 12/06/2015 10/23/2015 06/11/2015  Decreased Interest 0 0 0 0 0  Down, Depressed, Hopeless 0 0 0 0 0  PHQ - 2 Score 0 0 0 0 0  Review of Systems  Constitutional: Negative.   Eyes: Negative.   Respiratory: Negative.   Cardiovascular: Negative.   Endocrine: Negative.   Genitourinary: Negative.   Musculoskeletal: Positive for arthralgias, back pain and gait problem.  Skin: Negative.   Allergic/Immunologic: Negative.   Hematological: Negative.   Psychiatric/Behavioral: Negative.   All other systems reviewed and are negative.      Objective:   Physical Exam  Constitutional: She is oriented to person, place, and time. She appears well-developed and well-nourished.  HENT:  Head: Normocephalic and atraumatic.  Neck: Normal range of motion. Neck supple.  Cardiovascular: Normal rate and regular rhythm.   Pulmonary/Chest: Effort normal and breath sounds normal.  Musculoskeletal:  Normal Muscle Bulk and Muscle Testing Reveals: Upper Extremities" Right: Full ROM and Muscle Strength 5/5 Right AC Joint Tenderness Rhomboid  Tenderness Left: Decreased ROM 45 Degrees and Muscle Strength 4/5 Lumbar Paraspinal Tenderness: L-4- L-5 mainly left side Lower Extremities: Full ROM and Muscle Strength 5/5 Left Lower Extremity Flexion Produces Pain into Left hip, lower back and left knee Arises from table slowly using walker for support Narrow Based gait  Neurological: She is alert and oriented to person, place, and time.  Skin: Skin is warm and dry.  Psychiatric: She has a normal mood and affect.  Nursing note and vitals reviewed.         Assessment & Plan:  1. Lumbar spinal stenosis with right L3-4 radiculitis sensory only: Encouraged to increase activity and use heat therapy.  2. Left greater than right hip osteoarthritis: Continue with exercise and voltaren gel.  3. Chronic pain syndrome: Good Relief with current medication regime  4.Left Knee Osteoarthritis:  Refilled: Oxycodone 15 mg one tablet 3 times a day #90 and continue Tramadol 50 mg 1 tablet 4 times a day #120.  We will continue the opioid monitoring program, this consists of regular clinic visits, examinations, urine drug screen, pill counts as well as use of [place] Controlled Substance Reporting System.  20 minutes of face to face patient care time was spent during this visit. All questions were encouraged and answered.

## 2016-05-06 ENCOUNTER — Other Ambulatory Visit: Payer: Self-pay | Admitting: Physical Medicine & Rehabilitation

## 2016-05-24 ENCOUNTER — Other Ambulatory Visit: Payer: Self-pay | Admitting: Physical Medicine & Rehabilitation

## 2016-05-27 ENCOUNTER — Other Ambulatory Visit: Payer: Self-pay | Admitting: Physical Medicine & Rehabilitation

## 2016-06-01 ENCOUNTER — Emergency Department (HOSPITAL_COMMUNITY): Payer: PPO

## 2016-06-01 ENCOUNTER — Encounter (HOSPITAL_COMMUNITY): Payer: Self-pay | Admitting: Emergency Medicine

## 2016-06-01 ENCOUNTER — Emergency Department (HOSPITAL_COMMUNITY)
Admission: EM | Admit: 2016-06-01 | Discharge: 2016-06-01 | Disposition: A | Discharge status: Home / Self Care | Payer: PPO | Attending: Emergency Medicine | Admitting: Emergency Medicine

## 2016-06-01 DIAGNOSIS — W19XXXA Unspecified fall, initial encounter: Secondary | ICD-10-CM | POA: Diagnosis not present

## 2016-06-01 DIAGNOSIS — Y939 Activity, unspecified: Secondary | ICD-10-CM | POA: Insufficient documentation

## 2016-06-01 DIAGNOSIS — Y929 Unspecified place or not applicable: Secondary | ICD-10-CM | POA: Insufficient documentation

## 2016-06-01 DIAGNOSIS — N183 Chronic kidney disease, stage 3 (moderate): Secondary | ICD-10-CM | POA: Insufficient documentation

## 2016-06-01 DIAGNOSIS — S0083XA Contusion of other part of head, initial encounter: Secondary | ICD-10-CM | POA: Diagnosis not present

## 2016-06-01 DIAGNOSIS — S40011A Contusion of right shoulder, initial encounter: Secondary | ICD-10-CM | POA: Diagnosis not present

## 2016-06-01 DIAGNOSIS — S8012XA Contusion of left lower leg, initial encounter: Secondary | ICD-10-CM | POA: Diagnosis not present

## 2016-06-01 DIAGNOSIS — S8011XA Contusion of right lower leg, initial encounter: Secondary | ICD-10-CM | POA: Insufficient documentation

## 2016-06-01 DIAGNOSIS — Z79899 Other long term (current) drug therapy: Secondary | ICD-10-CM | POA: Diagnosis not present

## 2016-06-01 DIAGNOSIS — Z791 Long term (current) use of non-steroidal anti-inflammatories (NSAID): Secondary | ICD-10-CM | POA: Diagnosis not present

## 2016-06-01 DIAGNOSIS — Y999 Unspecified external cause status: Secondary | ICD-10-CM | POA: Insufficient documentation

## 2016-06-01 DIAGNOSIS — I129 Hypertensive chronic kidney disease with stage 1 through stage 4 chronic kidney disease, or unspecified chronic kidney disease: Secondary | ICD-10-CM | POA: Diagnosis not present

## 2016-06-01 DIAGNOSIS — S0990XA Unspecified injury of head, initial encounter: Secondary | ICD-10-CM | POA: Diagnosis present

## 2016-06-01 LAB — URINALYSIS, ROUTINE W REFLEX MICROSCOPIC
GLUCOSE, UA: NEGATIVE mg/dL
KETONES UR: NEGATIVE mg/dL
NITRITE: NEGATIVE
PH: 5 (ref 5.0–8.0)
PROTEIN: 30 mg/dL — AB
Specific Gravity, Urine: 1.02 (ref 1.005–1.030)

## 2016-06-01 LAB — URINE MICROSCOPIC-ADD ON

## 2016-06-01 NOTE — Progress Notes (Signed)
CSW spoke with patient at bedside with daughter, Kari Martinez and granddaughter, Kari Martinez present. Patient reports she lives at home alone. Patient reports she just remembers being on the floor. Patient reports she called her granddaughter to help get her off of the floor. Patient reports she does not remember anything concerning her fall. Patient reports she completes her own ADL's, however, she states she has a house cleaner that comes in once monthly. Patient reports she has a life alert call button. Patient's daughter states she is going to see if they are able to obtain the life alert that alerts the company of a fall. Patient reports she uses a walker. Patient reports she has good family support. No questions noted for CSW at this time.   Kari Martinez [id number] ED CSW 06/01/2016 2:31 PM

## 2016-06-01 NOTE — Discharge Instructions (Signed)
As discussed, your evaluation today has been largely reassuring.  But, it is important that you monitor your condition carefully, and do not hesitate to return to the ED if you develop new, or concerning changes in your condition.  Otherwise, please follow-up with your physician for appropriate ongoing care.  Also, recall that a secondary evaluation is being conducted on your urinalysis.  You will be made aware of abnormal findings.

## 2016-06-01 NOTE — ED Notes (Signed)
Pt transported to DG.  

## 2016-06-01 NOTE — ED Provider Notes (Signed)
[facility] DEPT Provider Note   CSN: [id number] Arrival date & time: 06/01/16  1032     History   Chief Complaint Chief Complaint  Patient presents with  . Fall    HPI AIRI Martinez is a 82 y.o. female.  HPI  Patient presents after a fall. Notably, fall occurred about 10 hours ago. Patient did not want to alarm her grand daughter, and waited until morning to call for assistance. Patient is unsure of the circumstances, but does have history of falls. Currently she combines of pain in the right shoulder, right knee, denies confusion, disorientation, weakness anywhere. She is generally active, generally well, but does note easy bruising with falls. No medication taken for pain relief and the pain in her shoulder and he is moderate, sore, diffuse.  Family notes that the patient has frequent UTI, typically without ongoing symptoms, request evaluation.  Past Medical History:  Diagnosis Date  . Arthritis   . Chronic renal insufficiency, stage III (moderate)   . Gout    Dr. Ouida Sills Rheumatologist  . Hyperlipidemia   . Hypertension   . Left humeral fracture Category 01/23/2012  . Nephrosclerosis    prn, Dr. Moshe Cipro  . Osteoarthritis   . Osteopenia   . Spinal stenosis    lumbar, bulging disc, receiving injections 01-01-2011,02-12-2011, Dr. Letta Pate (pain management)  . Thyroid disease    Hypo    Patient Active Problem List   Diagnosis Date Noted  . Urinary tract infection 03/26/2015  . ARF (acute renal failure) ([place]) 03/26/2015  . Elevated LFTs 03/26/2015  . Essential (primary) hypertension 03/26/2015  . Osteoarthritis of hip 08/14/2013  . Chronic low back pain 04/24/2013  . Acquired scoliosis 03/28/2013  . Back pain 01/23/2013  . Right lumbar radiculitis 01/23/2013  . Spinal stenosis of lumbar region 01/23/2013  . Lumbar spondylosis 01/12/2012    Past Surgical History:  Procedure Laterality Date  . ABDOMINAL HYSTERECTOMY     Ovaries intact  . BREAST  LUMPECTOMY    . CARPAL TUNNEL RELEASE     right  . CATARACT EXTRACTION, BILATERAL     and lens implants Dr. Katy Fitch 2010  . CHOLECYSTECTOMY    . CHOLECYSTECTOMY    . DILATION AND CURETTAGE OF UTERUS    . GALLBLADDER SURGERY    . KNEE SURGERY     left torn meniscus  . LUMP IN BREAST     Left Benign  . MENISCUS REPAIR    . RADIOFREQUENCY ABLATION NERVES     by Dr. Letta Pate  . THYROID SURGERY     Partial, left lobe  . TONSILLECTOMY      OB History    No data available       Home Medications    Prior to Admission medications   Medication Sig Start Date End Date Taking? Authorizing Provider  Ascorbic Acid (VITAMIN C) 500 MG CAPS Take 500 mg by mouth daily.    Historical Provider, MD  calcium carbonate (OS-CAL) 600 MG TABS Take 600 mg by mouth 2 (two) times daily with a meal.    Historical Provider, MD  Cholecalciferol (VITAMIN D) 1000 UNITS capsule Take 1,000 Units by mouth daily.    Historical Provider, MD  diazepam (VALIUM) 5 MG tablet Take one table by mouth Prior to procedure 01/10/16   Bayard Hugger, NP  diclofenac sodium (VOLTAREN) 1 % GEL APPLY 2 GRAMS EXTERNALLY TO THE AFFECTED AREA FOUR TIMES DAILY 03/03/16   Charlett Blake, MD  diclofenac sodium (VOLTAREN)  1 % GEL APPLY 2 GRAMS EXTERNALLY TO THE AFFECTED AREA FOUR TIMES DAILY 05/29/16   Charlett Blake, MD  ezetimibe-simvastatin (VYTORIN) 10-20 MG per tablet Take 1 tablet by mouth daily with breakfast.    Historical Provider, MD  Febuxostat (ULORIC) 80 MG TABS Take 80 mg by mouth daily.    Historical Provider, MD  fexofenadine (ALLEGRA) 180 MG tablet Take 180 mg by mouth daily.    Historical Provider, MD  gabapentin (NEURONTIN) 100 MG capsule TAKE 2 CAPSULES BY MOUTH THREE TIMES DAILY 05/25/16   Charlett Blake, MD  Glucosamine HCl 1000 MG TABS Take 1,000 mg by mouth 2 (two) times daily.    Historical Provider, MD  levothyroxine (SYNTHROID, LEVOTHROID) 137 MCG tablet Take 137 mcg by mouth daily.    Historical  Provider, MD  metoprolol succinate (TOPROL-XL) 25 MG 24 hr tablet TK 1 T PO QD 12/30/15   Historical Provider, MD  Multiple Vitamins-Minerals (MULTIVITAMIN WITH MINERALS) tablet Take 1 tablet by mouth daily.    Historical Provider, MD  oxyCODONE (ROXICODONE) 15 MG immediate release tablet Take 1 tablet (15 mg total) by mouth every 8 (eight) hours as needed for pain. 05/01/16   Bayard Hugger, NP  predniSONE (DELTASONE) 5 MG tablet Take 5 mg by mouth daily.    Historical Provider, MD  torsemide (DEMADEX) 5 MG tablet Take 5 mg by mouth daily.    Historical Provider, MD  traMADol (ULTRAM) 50 MG tablet TAKE 1 TABLET BY MOUTH FOUR TIMES DAILY 05/07/16   Charlett Blake, MD    Family History Family History  Problem Relation Age of Onset  . Heart disease Father     Social History Social History  Substance Use Topics  . Smoking status: Never Smoker  . Smokeless tobacco: Never Used  . Alcohol use No     Allergies   Alendronate sodium; Ketorolac; Ketorolac tromethamine; Mobic [meloxicam]; Nsaids; Risedronate sodium; Septra [sulfamethoxazole-trimethoprim]; Vioxx [rofecoxib]; Allopurinol; and Sulfa antibiotics   Review of Systems Review of Systems  Constitutional:       Per HPI, otherwise negative  HENT:       Per HPI, otherwise negative  Respiratory:       Per HPI, otherwise negative  Cardiovascular:       Per HPI, otherwise negative  Gastrointestinal: Negative for vomiting.  Endocrine:       Negative aside from HPI  Genitourinary:       Neg aside from HPI   Musculoskeletal:       Per HPI, otherwise negative  Skin: Negative.   Allergic/Immunologic: Negative for immunocompromised state.  Neurological: Negative for syncope.  Hematological: Bruises/bleeds easily.     Physical Exam Updated Vital Signs BP 105/55   Pulse 100   Temp 98.7 F (37.1 C) (Oral)   Resp 18   SpO2 99%   Physical Exam  Constitutional: She is oriented to person, place, and time. She appears  well-developed and well-nourished. No distress.  HENT:  Head: Normocephalic.  Hematoma, right forehead, mild tender to palpation  Eyes: Conjunctivae and EOM are normal.  Neck:  No neck pain, deformity, limited range of motion  Cardiovascular: Normal rate and regular rhythm.   Pulmonary/Chest: Effort normal and breath sounds normal. No stridor. No respiratory distress.  Abdominal: She exhibits no distension.  Musculoskeletal: She exhibits no edema.  Patient has some pain with range of motion of the right shoulder, but is able to abduct, elevate, rotate the shoulder, without substantial discomfort. Patient flexes  each hip and dependently, bends each knee with flexion, extension, spontaneously.   Neurological: She is alert and oriented to person, place, and time. No cranial nerve deficit. Coordination normal.  Skin: Skin is warm and dry.  Multiple areas of ecchymosis throughout the habitus, most prominently in the right lateral superior shoulder, both legs, anterior, distal quadriceps area  Psychiatric: She has a normal mood and affect.  Nursing note and vitals reviewed.    ED Treatments / Results  Labs (all labs ordered are listed, but only abnormal results are displayed) Labs Reviewed  URINALYSIS, ROUTINE W REFLEX MICROSCOPIC (NOT AT [facility]) - Abnormal; Notable for the following:       Result Value   Color, Urine AMBER (*)    APPearance TURBID (*)    Hgb urine dipstick MODERATE (*)    Bilirubin Urine SMALL (*)    Protein, ur 30 (*)    Leukocytes, UA LARGE (*)    All other components within normal limits  URINE MICROSCOPIC-ADD ON - Abnormal; Notable for the following:    Squamous Epithelial / LPF 0-5 (*)    Bacteria, UA MANY (*)    All other components within normal limits     Radiology Dg Shoulder Right  Result Date: 06/01/2016 CLINICAL DATA:  fall this morning.  Right shoulder pain. EXAM: RIGHT SHOULDER - 2+ VIEW COMPARISON:  None. FINDINGS: Degenerative changes in the  right [facility] and glenohumeral joints with joint space narrowing. No acute bony abnormality. Specifically, no fracture, subluxation, or dislocation. Soft tissues are intact. IMPRESSION: No acute bony abnormality. Electronically Signed   By: Rolm Baptise M.D.   On: 06/01/2016 11:14   Ct Head Wo Contrast  Result Date: 06/01/2016 CLINICAL DATA:  Fall this morning. EXAM: CT HEAD WITHOUT CONTRAST TECHNIQUE: Contiguous axial images were obtained from the base of the skull through the vertex without intravenous contrast. COMPARISON:  03/26/2015 FINDINGS: Brain: Mild age related volume loss. No acute intracranial abnormality. Specifically, no hemorrhage, hydrocephalus, mass lesion, acute infarction, or significant intracranial injury. Vascular: No hyperdense vessel or unexpected calcification. Skull: No acute calvarial abnormality. Sinuses/Orbits: Visualized paranasal sinuses and mastoids clear. Orbital soft tissues unremarkable. Other: None IMPRESSION: No acute intracranial abnormality. Electronically Signed   By: Rolm Baptise M.D.   On: 06/01/2016 13:12   Dg Knee Complete 4 Views Right  Result Date: 06/01/2016 CLINICAL DATA:  Fall at home this morning.  Right knee pain. EXAM: RIGHT KNEE - COMPLETE 4+ VIEW COMPARISON:  None. FINDINGS: Degenerative changes in the medial and patellofemoral compartments with joint space narrowing and spurring. No joint effusion. No acute bony abnormality. Specifically, no fracture, subluxation, or dislocation. Soft tissues are intact. IMPRESSION: Degenerative changes.  No acute bony abnormality. Electronically Signed   By: Rolm Baptise M.D.   On: 06/01/2016 11:15    Procedures Procedures (including critical care time)  On return from radiology, patient is in no distress, moves all extremity spontaneously, after initial x-rays completed, unremarkable.  2:34 PM Patient in no distress. She and her daughter are aware of all findings, including pending urine culture.   Initial  Impression / Assessment and Plan / ED Course  I have reviewed the triage vital signs and the nursing notes.  Pertinent labs & imaging results that were available during my care of the patient were reviewed by me and considered in my medical decision making (see chart for details).   Elderly female presents after a fall that occurred several hours prior to ED arrival. Here, no  evidence for fracture, intracranial hemorrhage. Patient was hemodynamically stable, with no new complaints during monitoring. Patient's urinalysis was abnormal, though not conclusive, urine culture sent. With no decompensation, notable changes, patient discharged in stable condition.   Carmin Muskrat, MD 06/01/16 1435

## 2016-06-01 NOTE — ED Notes (Signed)
Pt given sandwich and water.

## 2016-06-01 NOTE — ED Triage Notes (Signed)
Per EMS pt complaint of fall this morning with associated right forehead, right shoulder, and right knee pain. No blood thinners or LOC.

## 2016-06-04 ENCOUNTER — Ambulatory Visit: Payer: PPO | Admitting: Registered Nurse

## 2016-06-05 ENCOUNTER — Ambulatory Visit: Payer: PPO | Admitting: Physical Medicine & Rehabilitation

## 2016-06-12 ENCOUNTER — Encounter: Payer: Self-pay | Admitting: Physical Medicine & Rehabilitation

## 2016-06-12 ENCOUNTER — Encounter: Payer: PPO | Attending: Physical Medicine & Rehabilitation

## 2016-06-12 ENCOUNTER — Ambulatory Visit (HOSPITAL_BASED_OUTPATIENT_CLINIC_OR_DEPARTMENT_OTHER): Payer: PPO | Admitting: Physical Medicine & Rehabilitation

## 2016-06-12 VITALS — BP 117/63 | HR 74 | Resp 16

## 2016-06-12 DIAGNOSIS — G894 Chronic pain syndrome: Secondary | ICD-10-CM | POA: Insufficient documentation

## 2016-06-12 DIAGNOSIS — M48061 Spinal stenosis, lumbar region without neurogenic claudication: Secondary | ICD-10-CM

## 2016-06-12 DIAGNOSIS — Z79899 Other long term (current) drug therapy: Secondary | ICD-10-CM | POA: Insufficient documentation

## 2016-06-12 DIAGNOSIS — M1612 Unilateral primary osteoarthritis, left hip: Secondary | ICD-10-CM | POA: Diagnosis not present

## 2016-06-12 DIAGNOSIS — Z5181 Encounter for therapeutic drug level monitoring: Secondary | ICD-10-CM | POA: Diagnosis present

## 2016-06-12 MED ORDER — OXYCODONE HCL 15 MG PO TABS: 15.0000 mg | ORAL_TABLET | Freq: Three times a day (TID) | ORAL | 0 refills | Status: DC | PRN

## 2016-06-12 NOTE — Progress Notes (Signed)
  PROCEDURE RECORD [facility]   Name: Kari Martinez DOB:[birth year] MRN: [id number]  Date:06/12/2016  Physician: Alysia Penna, MD    Nurse/CMA: Shumaker RN  Allergies:  Allergies  Allergen Reactions  . Alendronate Sodium Other (See Comments)    Throat swelling  . Ketorolac Other (See Comments)    "Retained fluid in lungs"  . Ketorolac Tromethamine Other (See Comments)    FLUID RETENTION  . Mobic [Meloxicam]     Kidney decline  . Nsaids Other (See Comments)    Elevated kidney function  . Risedronate Sodium Other (See Comments)    Throat swelling and mouth swelling  . Septra [Sulfamethoxazole-Trimethoprim]     Rash  . Vioxx [Rofecoxib]     Kidney decline  . Allopurinol Rash  . Sulfa Antibiotics Rash    Consent Signed: Yes.    Is patient diabetic? No.  CBG today?   Pregnant: No. LMP: No LMP recorded. Patient is postmenopausal. (age 81-55)  Anticoagulants: no Anti-inflammatory: no Antibiotics: no  Procedure:left intra articular hip injection Position: Prone Start Time: 3:50 End Time: 3:55 Fluoro Time: 30 seconds  RN/CMA Biomedical engineer    Time 3:32 4:00    BP 117/63 121/66    Pulse 74 75    Respirations 16 16    O2 Sat 90 90    S/S 6 6    Pain Level 7/10 7/10     D/C home with Friends ([facility]), patient A & O X 3, D/C instructions reviewed, and sits independently.

## 2016-06-12 NOTE — Patient Instructions (Signed)
Left hip injection under fluoroscopy, injected lidocaine plus Celestone. This can be repeated in 3 months as needed. follow-up with my nurse practitioner in one month

## 2016-06-12 NOTE — Progress Notes (Signed)
Aspiration/Injection Procedure Note BILLE SCHWERIN [id number] 1923-10-25  Procedure: Injection and Left hip under Fluorscopic guidance Indications: End stage OA Left hip  Procedure Details Consent: Risks of procedure as well as the alternatives and risks of each were explained to the patient.  Consent for procedure obtained. Time Out: Verified patient identification, verified procedure, site/side was marked, verified correct patient position, special equipment/implants available, medications/allergies/relevent history reviewed, required imaging and test results available.  Performed   Local Anesthesia Used:Lidocaine 1% plain; 42mL   Under fluoroscopic guidance 22-gauge 3.5 inch spinal needle was directed into the right hip joint targeting the left femoral neck midpoint at junction with femoral head. Once target was reached and bone contact was established, Omnipaque 180 2 mL demonstrated good joint outline anda solution containing 1 cc of 6 mg/cc Celestone +4 cc of 1% lidocaine were injected. Patient tolerated procedure well.   Estimated blood loss: nil  Clayten Allcock E 06/12/2016, 3:57 PM

## 2016-06-24 ENCOUNTER — Telehealth: Payer: Self-pay | Admitting: Physical Medicine & Rehabilitation

## 2016-06-24 MED ORDER — TRAMADOL HCL 50 MG PO TABS: 50.0000 mg | ORAL_TABLET | Freq: Four times a day (QID) | ORAL | 3 refills | Status: DC

## 2016-06-24 NOTE — Telephone Encounter (Signed)
Patient needs a refill on Tramadol.  Please call patient when this is done. °

## 2016-06-24 NOTE — Telephone Encounter (Signed)
Tramadol called into pharmacy, Kari Martinez is aware.

## 2016-06-24 NOTE — Telephone Encounter (Signed)
Called patient to let her Zella Ball will be calling in her Tramadol prescription to her pharmacy.

## 2016-07-10 ENCOUNTER — Encounter: Payer: Self-pay | Admitting: Registered Nurse

## 2016-07-10 ENCOUNTER — Encounter: Payer: PPO | Attending: Physical Medicine & Rehabilitation | Admitting: Registered Nurse

## 2016-07-10 VITALS — BP 145/77 | HR 69

## 2016-07-10 DIAGNOSIS — Z79899 Other long term (current) drug therapy: Secondary | ICD-10-CM | POA: Diagnosis present

## 2016-07-10 DIAGNOSIS — Z5181 Encounter for therapeutic drug level monitoring: Secondary | ICD-10-CM | POA: Diagnosis not present

## 2016-07-10 DIAGNOSIS — M1612 Unilateral primary osteoarthritis, left hip: Secondary | ICD-10-CM

## 2016-07-10 DIAGNOSIS — G894 Chronic pain syndrome: Secondary | ICD-10-CM | POA: Insufficient documentation

## 2016-07-10 DIAGNOSIS — M47816 Spondylosis without myelopathy or radiculopathy, lumbar region: Secondary | ICD-10-CM | POA: Diagnosis not present

## 2016-07-10 MED ORDER — OXYCODONE HCL 15 MG PO TABS: 15.0000 mg | ORAL_TABLET | Freq: Three times a day (TID) | ORAL | 0 refills | Status: DC | PRN

## 2016-07-10 NOTE — Progress Notes (Signed)
Subjective:    Patient ID: Kari Martinez, female    DOB: 12-Mar-1924, 82 y.o.   MRN: [id number]  HPI: [name] is a 82 year old female who returns for follow up for chronic pain and medication refill. She states her pain is located in her lower back.She rates her pain 4 . Her current exercise regime is walking in her home with her walker.  S/P left hip injection with good relief noted.  Pain Inventory Average Pain 4 Pain Right Now 4 My pain is intermittent and aching  In the last 24 hours, has pain interfered with the following? General activity 5 Relation with others 5 Enjoyment of life 5 What TIME of day is your pain at its worst? morning and daytime Sleep (in general) Fair  Pain is worse with: walking and standing Pain improves with: rest and medication Relief from Meds: 5  Mobility use a walker how many minutes can you walk? 5-10 do you drive?  yes  Function retired I need assistance with the following:  household duties  Neuro/Psych trouble walking  Prior Studies Any changes since last visit?  no  Physicians involved in your care Any changes since last visit?  no   Family History  Problem Relation Age of Onset  . Heart disease Father    Social History   Social History  . Marital status: Widowed    Spouse name: N/A  . Number of children: N/A  . Years of education: N/A   Social History Main Topics  . Smoking status: Never Smoker  . Smokeless tobacco: Never Used  . Alcohol use No  . Drug use: No  . Sexual activity: No   Other Topics Concern  . None   Social History Narrative  . None   Past Surgical History:  Procedure Laterality Date  . ABDOMINAL HYSTERECTOMY     Ovaries intact  . BREAST LUMPECTOMY    . CARPAL TUNNEL RELEASE     right  . CATARACT EXTRACTION, BILATERAL     and lens implants Dr. Katy Fitch 2010  . CHOLECYSTECTOMY    . CHOLECYSTECTOMY    . DILATION AND CURETTAGE OF UTERUS    . GALLBLADDER SURGERY    . KNEE SURGERY       left torn meniscus  . LUMP IN BREAST     Left Benign  . MENISCUS REPAIR    . RADIOFREQUENCY ABLATION NERVES     by Dr. Letta Pate  . THYROID SURGERY     Partial, left lobe  . TONSILLECTOMY     Past Medical History:  Diagnosis Date  . Arthritis   . Chronic renal insufficiency, stage III (moderate)   . Gout    Dr. Ouida Sills Rheumatologist  . Hyperlipidemia   . Hypertension   . Left humeral fracture Category 01/23/2012  . Nephrosclerosis    prn, Dr. Moshe Cipro  . Osteoarthritis   . Osteopenia   . Spinal stenosis    lumbar, bulging disc, receiving injections 01-01-2011,02-12-2011, Dr. Letta Pate (pain management)  . Thyroid disease    Hypo   BP (!) 145/77   Pulse 69   SpO2 94%   Opioid Risk Score:   Fall Risk Score:  `1  Depression screen PHQ 2/9  Depression screen [facility] 2/9 06/12/2016 04/01/2016 03/04/2016 12/06/2015 10/23/2015 06/11/2015  Decreased Interest 0 0 0 0 0 0  Down, Depressed, Hopeless 0 0 0 0 0 0  PHQ - 2 Score 0 0 0 0 0 0  Review of Systems  Constitutional: Negative.   HENT: Negative.   Eyes: Negative.   Respiratory: Negative.   Cardiovascular: Negative.   Gastrointestinal: Negative.   Endocrine: Negative.   Genitourinary: Negative.   Musculoskeletal: Positive for back pain.  Skin: Negative.   Allergic/Immunologic: Negative.   Neurological: Negative.   Hematological: Negative.   Psychiatric/Behavioral: Negative.        Objective:   Physical Exam  Constitutional: She is oriented to person, place, and time. She appears well-developed and well-nourished.  HENT:  Head: Normocephalic and atraumatic.  Neck: Normal range of motion. Neck supple.  Cardiovascular: Normal rate and regular rhythm.   Pulmonary/Chest: Effort normal and breath sounds normal.  Musculoskeletal:  Normal Muscle Bulk and Muscle Testing Reveals: Upper Extremities: Right: Full ROM and Muscle Strength 5/5 Left: Decreased ROM 45 degrees and Muscle Strength 5/5 Lower Extremities: Full  ROM and Muscle Strength 5/5 Arises from chair with ease Narrow Based Gait  Neurological: She is alert and oriented to person, place, and time.  Skin: Skin is warm and dry.  Psychiatric: She has a normal mood and affect.  Nursing note and vitals reviewed.         Assessment & Plan:  1. Lumbar spinal stenosis with right L3-4 radiculitis sensory only: Encouraged to increase activity and use heat therapy.  2. Left greater than right hip osteoarthritis: S/P injection with good relief noted. No complaints today. Continue with exercise and voltaren gel.  3. Chronic pain syndrome: Good Relief with current medication regime  4.Left Knee Osteoarthritis:  Refilled: Oxycodone 15 mg one tablet 3 times a day #90 and continue Tramadol 50 mg 1 tablet 4 times a day #120.  We will continue the opioid monitoring program, this consists of regular clinic visits, examinations, urine drug screen, pill counts as well as use of [place] Controlled Substance Reporting System.  20 minutes of face to face patient care time was spent during this visit. All questions were encouraged and answered.

## 2016-07-29 ENCOUNTER — Other Ambulatory Visit: Payer: Self-pay | Admitting: Registered Nurse

## 2016-07-31 ENCOUNTER — Other Ambulatory Visit: Payer: Self-pay | Admitting: Registered Nurse

## 2016-08-11 ENCOUNTER — Encounter: Payer: PPO | Attending: Physical Medicine & Rehabilitation | Admitting: Registered Nurse

## 2016-08-11 ENCOUNTER — Encounter: Payer: Self-pay | Admitting: Registered Nurse

## 2016-08-11 VITALS — BP 140/82 | HR 75 | Resp 14

## 2016-08-11 DIAGNOSIS — M1712 Unilateral primary osteoarthritis, left knee: Secondary | ICD-10-CM

## 2016-08-11 DIAGNOSIS — Z5181 Encounter for therapeutic drug level monitoring: Secondary | ICD-10-CM | POA: Diagnosis present

## 2016-08-11 DIAGNOSIS — M1612 Unilateral primary osteoarthritis, left hip: Secondary | ICD-10-CM

## 2016-08-11 DIAGNOSIS — Z79899 Other long term (current) drug therapy: Secondary | ICD-10-CM | POA: Diagnosis not present

## 2016-08-11 DIAGNOSIS — M47816 Spondylosis without myelopathy or radiculopathy, lumbar region: Secondary | ICD-10-CM | POA: Diagnosis not present

## 2016-08-11 DIAGNOSIS — G894 Chronic pain syndrome: Secondary | ICD-10-CM | POA: Insufficient documentation

## 2016-08-11 MED ORDER — OXYCODONE HCL 15 MG PO TABS: 15.0000 mg | ORAL_TABLET | Freq: Three times a day (TID) | ORAL | 0 refills | Status: DC | PRN

## 2016-08-11 NOTE — Progress Notes (Signed)
Subjective:    Patient ID: Kari Martinez, female    DOB: Sep 22, 1924, 82 y.o.   MRN: [id number]  HPI:  Kari Martinez is a 82 year old female who returns for follow up appointment for chronic pain and medication refill. She states her pain is located in her lower back, left hip and left knee.She ratesher pain 6. Her current exercise regime is walking in her home with her walker.   Pain Inventory Average Pain 6 Pain Right Now 6 My pain is intermittent and aching  In the last 24 hours, has pain interfered with the following? General activity 5 Relation with others 6 Enjoyment of life 6 What TIME of day is your pain at its worst? morning, daytime Sleep (in general) Fair  Pain is worse with: walking and standing Pain improves with: rest, heat/ice and medication Relief from Meds: 6  Mobility use a walker how many minutes can you walk? 5-10  ability to climb steps?  no do you drive?  yes  Function retired I need assistance with the following:  household duties and shopping  Neuro/Psych trouble walking  Prior Studies Any changes since last visit?  no  Physicians involved in your care Any changes since last visit?  no   Family History  Problem Relation Age of Onset  . Heart disease Father    Social History   Social History  . Marital status: Widowed    Spouse name: N/A  . Number of children: N/A  . Years of education: N/A   Social History Main Topics  . Smoking status: Never Smoker  . Smokeless tobacco: Never Used  . Alcohol use No  . Drug use: No  . Sexual activity: No   Other Topics Concern  . None   Social History Narrative  . None   Past Surgical History:  Procedure Laterality Date  . ABDOMINAL HYSTERECTOMY     Ovaries intact  . BREAST LUMPECTOMY    . CARPAL TUNNEL RELEASE     right  . CATARACT EXTRACTION, BILATERAL     and lens implants Dr. Katy Fitch 2010  . CHOLECYSTECTOMY    . CHOLECYSTECTOMY    . DILATION AND CURETTAGE OF UTERUS    .  GALLBLADDER SURGERY    . KNEE SURGERY     left torn meniscus  . LUMP IN BREAST     Left Benign  . MENISCUS REPAIR    . RADIOFREQUENCY ABLATION NERVES     by Dr. Letta Pate  . THYROID SURGERY     Partial, left lobe  . TONSILLECTOMY     Past Medical History:  Diagnosis Date  . Arthritis   . Chronic renal insufficiency, stage III (moderate)   . Gout    Dr. Ouida Sills Rheumatologist  . Hyperlipidemia   . Hypertension   . Left humeral fracture Category 01/23/2012  . Nephrosclerosis    prn, Dr. Moshe Cipro  . Osteoarthritis   . Osteopenia   . Spinal stenosis    lumbar, bulging disc, receiving injections 01-01-2011,02-12-2011, Dr. Letta Pate (pain management)  . Thyroid disease    Hypo   BP 140/82 (BP Location: Right Arm, Patient Position: Sitting, Cuff Size: Large)   Pulse 75   Resp 14   SpO2 94%   Opioid Risk Score:   Fall Risk Score:  `1  Depression screen PHQ 2/9  Depression screen [facility] 2/9 06/12/2016 04/01/2016 03/04/2016 12/06/2015 10/23/2015 06/11/2015  Decreased Interest 0 0 0 0 0 0  Down, Depressed, Hopeless 0  0 0 0 0 0  PHQ - 2 Score 0 0 0 0 0 0    Review of Systems  Constitutional: Negative.   HENT: Negative.   Eyes: Negative.   Respiratory: Negative.   Cardiovascular: Negative.   Gastrointestinal: Negative.   Endocrine: Negative.   Genitourinary: Negative.   Musculoskeletal: Positive for gait problem.  Skin: Negative.   Allergic/Immunologic: Negative.   Hematological: Negative.   Psychiatric/Behavioral: Negative.   All other systems reviewed and are negative.      Objective:   Physical Exam  Constitutional: She is oriented to person, place, and time. She appears well-developed and well-nourished.  HENT:  Head: Normocephalic and atraumatic.  Neck: Normal range of motion. Neck supple.  Cardiovascular: Normal rate and regular rhythm.   Pulmonary/Chest: Effort normal and breath sounds normal.  Musculoskeletal:  Normal Muscle Bulk and Muscle Testing  Reveals: Upper Extremities: Right: Full ROM and Muscle Strength 5/5 Left: Decreased ROM 45 Degrees and Muscle Strength 5/5 Left Lumbar Paraspinal Tenderness: L-3- L-5 Lower Extremities: Full ROM and Muscle Strength 5/5 Left Lower Extremity Flexion Produces Pain into Patella and Left Popliteal Fossa Arises from Table slowly using walker for support Antalgic gait   Neurological: She is alert and oriented to person, place, and time.  Skin: Skin is warm and dry.  Psychiatric: She has a normal mood and affect.  Nursing note and vitals reviewed.         Assessment & Plan:  1. Lumbar spinal stenosis with right L3-4 radiculitis sensory only: Encouraged to increase activity and use heat therapy.  2. Left greater than right hip osteoarthritis: Continue with exercise and voltaren gel.  3. Chronic pain syndrome: Good Relief with current medication regime  4.Left Knee Osteoarthritis:  Refilled: Oxycodone 15 mg one tablet 3 times a day #90 and continue Tramadol 50 mg 1 tablet 4 times a day #120.  We will continue the opioid monitoring program, this consists of regular clinic visits, examinations, urine drug screen, pill counts as well as use of [place] Controlled Substance Reporting System.  20 minutes of face to face patient care time was spent during this visit. All questions were encouraged and answered.

## 2016-08-11 NOTE — Addendum Note (Signed)
Addended by: Caro Hight on: 08/11/2016 03:13 PM   Modules accepted: Orders

## 2016-08-19 LAB — TOXASSURE SELECT,+ANTIDEPR,UR

## 2016-08-20 NOTE — Progress Notes (Signed)
Urine drug screen for this encounter is consistent for prescribed medication 

## 2016-08-25 ENCOUNTER — Other Ambulatory Visit: Payer: Self-pay | Admitting: Physical Medicine & Rehabilitation

## 2016-09-08 ENCOUNTER — Encounter: Payer: PPO | Attending: Physical Medicine & Rehabilitation | Admitting: Registered Nurse

## 2016-09-08 ENCOUNTER — Encounter: Payer: Self-pay | Admitting: Registered Nurse

## 2016-09-08 VITALS — BP 177/85 | HR 74

## 2016-09-08 DIAGNOSIS — M47816 Spondylosis without myelopathy or radiculopathy, lumbar region: Secondary | ICD-10-CM

## 2016-09-08 DIAGNOSIS — Z5181 Encounter for therapeutic drug level monitoring: Secondary | ICD-10-CM

## 2016-09-08 DIAGNOSIS — M1612 Unilateral primary osteoarthritis, left hip: Secondary | ICD-10-CM

## 2016-09-08 DIAGNOSIS — G894 Chronic pain syndrome: Secondary | ICD-10-CM | POA: Diagnosis not present

## 2016-09-08 DIAGNOSIS — I10 Essential (primary) hypertension: Secondary | ICD-10-CM

## 2016-09-08 DIAGNOSIS — M1712 Unilateral primary osteoarthritis, left knee: Secondary | ICD-10-CM

## 2016-09-08 DIAGNOSIS — Z79899 Other long term (current) drug therapy: Secondary | ICD-10-CM

## 2016-09-08 MED ORDER — OXYCODONE HCL 15 MG PO TABS: 15.0000 mg | ORAL_TABLET | Freq: Three times a day (TID) | ORAL | 0 refills | Status: DC | PRN

## 2016-09-08 NOTE — Progress Notes (Signed)
Subjective:    Patient ID: Kari Martinez, female    DOB: December 24, 1923, 82 y.o.   MRN: [id number]  HPI: [name] is a 82 year old female who returns for follow up appointment for chronic pain and medication refill. She states her pain is located in her lower back radiating into her left hip and left knee.She ratesher pain 5. Her current exercise regime is walking in her home with her walker.  Arrived hypertensive, blood pressure rechecked, she states she is compliant with her medications. She refuses ED evaluation, encouraged to keep blood pressure log and F/U with her PCP. She verbalizes understanding.  Pain Inventory Average Pain 6 Pain Right Now 5 My pain is intermittent and stabbing  In the last 24 hours, has pain interfered with the following? General activity 5 Relation with others 6 Enjoyment of life 6 What TIME of day is your pain at its worst? morning Sleep (in general) Fair  Pain is worse with: walking and standing Pain improves with: rest and medication Relief from Meds: 6  Mobility use a walker  Function retired I need assistance with the following:  household duties and shopping  Neuro/Psych trouble walking  Prior Studies Any changes since last visit?  no  Physicians involved in your care Any changes since last visit?  no   Family History  Problem Relation Age of Onset  . Heart disease Father    Social History   Social History  . Marital status: Widowed    Spouse name: N/A  . Number of children: N/A  . Years of education: N/A   Social History Main Topics  . Smoking status: Never Smoker  . Smokeless tobacco: Never Used  . Alcohol use No  . Drug use: No  . Sexual activity: No   Other Topics Concern  . Not on file   Social History Narrative  . No narrative on file   Past Surgical History:  Procedure Laterality Date  . ABDOMINAL HYSTERECTOMY     Ovaries intact  . BREAST LUMPECTOMY    . CARPAL TUNNEL RELEASE     right  .  CATARACT EXTRACTION, BILATERAL     and lens implants Dr. Katy Fitch 2010  . CHOLECYSTECTOMY    . CHOLECYSTECTOMY    . DILATION AND CURETTAGE OF UTERUS    . GALLBLADDER SURGERY    . KNEE SURGERY     left torn meniscus  . LUMP IN BREAST     Left Benign  . MENISCUS REPAIR    . RADIOFREQUENCY ABLATION NERVES     by Dr. Letta Pate  . THYROID SURGERY     Partial, left lobe  . TONSILLECTOMY     Past Medical History:  Diagnosis Date  . Arthritis   . Chronic renal insufficiency, stage III (moderate)   . Gout    Dr. Ouida Sills Rheumatologist  . Hyperlipidemia   . Hypertension   . Left humeral fracture Category 01/23/2012  . Nephrosclerosis    prn, Dr. Moshe Cipro  . Osteoarthritis   . Osteopenia   . Spinal stenosis    lumbar, bulging disc, receiving injections 01-01-2011,02-12-2011, Dr. Letta Pate (pain management)  . Thyroid disease    Hypo   There were no vitals taken for this visit.  Opioid Risk Score:   Fall Risk Score:  `1  Depression screen PHQ 2/9  Depression screen [facility] 2/9 06/12/2016 04/01/2016 03/04/2016 12/06/2015 10/23/2015 06/11/2015  Decreased Interest 0 0 0 0 0 0  Down, Depressed, Hopeless 0  0 0 0 0 0  PHQ - 2 Score 0 0 0 0 0 0   Review of Systems  Constitutional: Negative.   HENT: Negative.   Eyes: Negative.   Respiratory: Negative.   Cardiovascular: Negative.   Gastrointestinal: Negative.   Endocrine: Negative.   Genitourinary: Negative.   Musculoskeletal: Positive for gait problem and joint swelling.  Skin: Negative.   Allergic/Immunologic: Negative.   Hematological: Negative.   Psychiatric/Behavioral: Negative.   All other systems reviewed and are negative.      Objective:   Physical Exam  Constitutional: She is oriented to person, place, and time. She appears well-developed and well-nourished.  HENT:  Head: Normocephalic and atraumatic.  Neck: Normal range of motion. Neck supple.  Cardiovascular: Normal rate and regular rhythm.   Pulmonary/Chest: Effort  normal and breath sounds normal.  Musculoskeletal:  Normal Muscle Bulk and Muscle Testing Reveals: Upper Extremities: Decreased ROM 45 Degrees and Muscle Strength 4/5 Lumbar Paraspinal Tenderness: L-3-L-5 Left Greater Trochanteric Tenderness Lower Extremities: Full ROM and Muscle Strength 5/5 Left Lower Extremity Flexion Produces Pain into Left Hip and Left knee Arises from Table Slowly using walker for support Narrow Based Gait   Neurological: She is alert and oriented to person, place, and time.  Skin: Skin is warm and dry.  Psychiatric: She has a normal mood and affect.  Nursing note and vitals reviewed.         Assessment & Plan:  1. Lumbar spinal stenosis with right L3-4 radiculitis sensory only: Encouraged to increase activity and use heat therapy.  2. Left greater than right hip osteoarthritis: Continue with exercise and voltaren gel.  3. Chronic pain syndrome: Good Relief with current medication regime  4.Left Knee Osteoarthritis:  Refilled: Oxycodone 15 mg one tablet 3 times a day #90 and continue Tramadol 50 mg 1 tablet 4 times a day #120. 5. HTN: Refuses ED evaluation. States she will F/U with her PCP  We will continue the opioid monitoring program, this consists of regular clinic visits, examinations, urine drug screen, pill counts as well as use of [place] Controlled Substance Reporting System.  20 minutes of face to face patient care time was spent during this visit. All questions were encouraged and answered.

## 2016-10-13 ENCOUNTER — Ambulatory Visit (HOSPITAL_BASED_OUTPATIENT_CLINIC_OR_DEPARTMENT_OTHER): Payer: Medicare Other | Admitting: Physical Medicine & Rehabilitation

## 2016-10-13 ENCOUNTER — Encounter: Payer: Medicare Other | Attending: Physical Medicine & Rehabilitation

## 2016-10-13 ENCOUNTER — Ambulatory Visit: Payer: PPO | Admitting: Registered Nurse

## 2016-10-13 ENCOUNTER — Encounter: Payer: Self-pay | Admitting: Physical Medicine & Rehabilitation

## 2016-10-13 VITALS — BP 184/78 | HR 67

## 2016-10-13 DIAGNOSIS — Z79899 Other long term (current) drug therapy: Secondary | ICD-10-CM | POA: Diagnosis present

## 2016-10-13 DIAGNOSIS — G894 Chronic pain syndrome: Secondary | ICD-10-CM | POA: Insufficient documentation

## 2016-10-13 DIAGNOSIS — M48061 Spinal stenosis, lumbar region without neurogenic claudication: Secondary | ICD-10-CM

## 2016-10-13 DIAGNOSIS — M47816 Spondylosis without myelopathy or radiculopathy, lumbar region: Secondary | ICD-10-CM

## 2016-10-13 DIAGNOSIS — Z5181 Encounter for therapeutic drug level monitoring: Secondary | ICD-10-CM | POA: Diagnosis present

## 2016-10-13 DIAGNOSIS — M1712 Unilateral primary osteoarthritis, left knee: Secondary | ICD-10-CM

## 2016-10-13 DIAGNOSIS — M1612 Unilateral primary osteoarthritis, left hip: Secondary | ICD-10-CM

## 2016-10-13 MED ORDER — OXYCODONE HCL 15 MG PO TABS: 15.0000 mg | ORAL_TABLET | Freq: Three times a day (TID) | ORAL | 0 refills | Status: DC | PRN

## 2016-10-13 MED ORDER — TRAMADOL HCL 50 MG PO TABS: 50.0000 mg | ORAL_TABLET | Freq: Four times a day (QID) | ORAL | 0 refills | Status: DC

## 2016-10-13 NOTE — Progress Notes (Signed)
Subjective:    Patient ID: Kari Martinez, female    DOB: [birth year], 83 y.o.   MRN: [id number]  HPI Patient still has relief from left intra-articular hip injection performed 06/12/2017 Left knee is hurting. This is causing some night pain. Pain is persisting despite oxycodone, tramadol, and diclofenac gel. History of fall back in August with right knee injury as well as right shoulder injury. The right knee x-ray showed medial compartment OA Current complaints are on the left side. However Pain Inventory Average Pain 6 Pain Right Now 6 My pain is intermittent, sharp and stabbing  In the last 24 hours, has pain interfered with the following? General activity 0 Relation with others 0 Enjoyment of life 0 What TIME of day is your pain at its worst? varies Sleep (in general) Good  Pain is worse with: walking and standing Pain improves with: rest, heat/ice, medication and injections Relief from Meds: 5  Mobility walk with assistance use a walker how many minutes can you walk? 5-10 ability to climb steps?  no do you drive?  yes  Function retired I need assistance with the following:  household duties and shopping  Neuro/Psych trouble walking  Prior Studies Any changes since last visit?  no  Physicians involved in your care Any changes since last visit?  no   Family History  Problem Relation Age of Onset  . Heart disease Father    Social History   Social History  . Marital status: Widowed    Spouse name: N/A  . Number of children: N/A  . Years of education: N/A   Social History Main Topics  . Smoking status: Never Smoker  . Smokeless tobacco: Never Used  . Alcohol use No  . Drug use: No  . Sexual activity: No   Other Topics Concern  . None   Social History Narrative  . None   Past Surgical History:  Procedure Laterality Date  . ABDOMINAL HYSTERECTOMY     Ovaries intact  . BREAST LUMPECTOMY    . CARPAL TUNNEL RELEASE     right  . CATARACT  EXTRACTION, BILATERAL     and lens implants Dr. Katy Fitch 2010  . CHOLECYSTECTOMY    . CHOLECYSTECTOMY    . DILATION AND CURETTAGE OF UTERUS    . GALLBLADDER SURGERY    . KNEE SURGERY     left torn meniscus  . LUMP IN BREAST     Left Benign  . MENISCUS REPAIR    . RADIOFREQUENCY ABLATION NERVES     by Dr. Letta Pate  . THYROID SURGERY     Partial, left lobe  . TONSILLECTOMY     Past Medical History:  Diagnosis Date  . Arthritis   . Chronic renal insufficiency, stage III (moderate)   . Gout    Dr. Ouida Sills Rheumatologist  . Hyperlipidemia   . Hypertension   . Left humeral fracture Category 01/23/2012  . Nephrosclerosis    prn, Dr. Moshe Cipro  . Osteoarthritis   . Osteopenia   . Spinal stenosis    lumbar, bulging disc, receiving injections 01-01-2011,02-12-2011, Dr. Letta Pate (pain management)  . Thyroid disease    Hypo   BP (!) 184/78   Pulse 67   SpO2 90%   Opioid Risk Score:   Fall Risk Score:  `1  Depression screen PHQ 2/9  Depression screen [facility] 2/9 06/12/2016 04/01/2016 03/04/2016 12/06/2015 10/23/2015 06/11/2015  Decreased Interest 0 0 0 0 0 0  Down, Depressed, Hopeless 0 0 0 0  0 0  PHQ - 2 Score 0 0 0 0 0 0    Review of Systems  Constitutional: Negative.   HENT: Negative.   Eyes: Negative.   Respiratory: Negative.   Cardiovascular: Negative.   Gastrointestinal: Negative.   Endocrine: Negative.   Genitourinary: Negative.   Musculoskeletal: Positive for arthralgias, back pain, gait problem and myalgias.  Allergic/Immunologic: Negative.   Hematological: Negative.   Psychiatric/Behavioral: Negative.   All other systems reviewed and are negative.      Objective:   Physical Exam  Constitutional: She is oriented to person, place, and time. She appears well-developed and well-nourished.  HENT:  Head: Normocephalic and atraumatic.  Eyes: Conjunctivae and EOM are normal. Pupils are equal, round, and reactive to light.  Neck: Normal range of motion.    Musculoskeletal:       Left knee: She exhibits no effusion. Tenderness found. Medial joint line and lateral joint line tenderness noted. No patellar tendon tenderness noted.  Cannot appreciate effusion, however, patient has excess adiposity around the knees  Neurological: She is alert and oriented to person, place, and time.  Psychiatric: She has a normal mood and affect. Her behavior is normal. Judgment and thought content normal.  Nursing note and vitals reviewed.         Assessment & Plan:  1. History of lumbar spinal stenosis, on chronic moderate dose narcotic analgesics, Roxicodone 15 mg 3 times a day  Last UDS was 08/11/2016, appropriate Continue opioid monitoring program. This consists of regular clinic visits, examinations, urine drug screen, pill counts as well as use of [place] controlled substance reporting System.  2. Left osteoarthritis, status post injection in September 2017.  3. Left knee pain, likely osteoarthritis, pain persists despite medication management as listed above. Recommend injection

## 2016-10-13 NOTE — Progress Notes (Signed)
Knee injection Left withoutultrasound guidance)  Indication:Left Knee pain not relieved by medication management and other conservative care.  Informed consent was obtained after describing risks and benefits of the procedure with the patient, this includes bleeding, bruising, infection and medication side effects. The patient wishes to proceed and has given written consent. The patient was placed in a recumbent position. The medial aspect of the knee was marked and prepped with Betadine and alcohol.Then 25 g 1.5 inch needle was inserted into the knee joint, Left superomedial approach. After negative draw back for blood, a solution containing one ML of 6mg  per mL betamethasone and 3 mL of 1% lidocaine were injected. The patient tolerated the procedure well. Post procedure instructions were given.

## 2016-10-13 NOTE — Patient Instructions (Signed)
You will see Kari Martinez next month. If your left hip pain increases, asked her to schedule a repeat left hip injection under fluoroscopic guidance

## 2016-11-16 ENCOUNTER — Encounter: Payer: Self-pay | Admitting: Registered Nurse

## 2016-11-16 ENCOUNTER — Telehealth: Payer: Self-pay | Admitting: Registered Nurse

## 2016-11-16 ENCOUNTER — Encounter: Payer: Medicare Other | Attending: Physical Medicine & Rehabilitation | Admitting: Registered Nurse

## 2016-11-16 VITALS — BP 119/66 | HR 87

## 2016-11-16 DIAGNOSIS — M1612 Unilateral primary osteoarthritis, left hip: Secondary | ICD-10-CM | POA: Diagnosis not present

## 2016-11-16 DIAGNOSIS — M1712 Unilateral primary osteoarthritis, left knee: Secondary | ICD-10-CM

## 2016-11-16 DIAGNOSIS — M47816 Spondylosis without myelopathy or radiculopathy, lumbar region: Secondary | ICD-10-CM | POA: Diagnosis not present

## 2016-11-16 DIAGNOSIS — G894 Chronic pain syndrome: Secondary | ICD-10-CM | POA: Insufficient documentation

## 2016-11-16 DIAGNOSIS — Z5181 Encounter for therapeutic drug level monitoring: Secondary | ICD-10-CM | POA: Diagnosis not present

## 2016-11-16 DIAGNOSIS — Z79899 Other long term (current) drug therapy: Secondary | ICD-10-CM | POA: Insufficient documentation

## 2016-11-16 MED ORDER — TRAMADOL HCL 50 MG PO TABS: 50.0000 mg | ORAL_TABLET | Freq: Four times a day (QID) | ORAL | 0 refills | Status: DC

## 2016-11-16 MED ORDER — OXYCODONE HCL 15 MG PO TABS: 15.0000 mg | ORAL_TABLET | Freq: Three times a day (TID) | ORAL | 0 refills | Status: DC | PRN

## 2016-11-16 NOTE — Progress Notes (Signed)
Subjective:    Patient ID: Kari Martinez, female    DOB: 1924/09/19, 83 y.o.   MRN: [id number]  HPI: [name] is a 83 year old female who returns for follow up appointmentfor chronic pain and medication refill. She states her pain is located in her lower back radiating into her left hip and left lower extremity laterally and left knee pain.She ratesher pain 3. Her current exercise regime is walking in her home with her walker.  S/P Left Knee injection with  Pain Inventory Average Pain 3 Pain Right Now 3 My pain is intermittent and dull  In the last 24 hours, has pain interfered with the following? General activity 4 Relation with others 3 Enjoyment of life 5 What TIME of day is your pain at its worst? daytime Sleep (in general) Fair  Pain is worse with: walking Pain improves with: rest and medication Relief from Meds: 6  Mobility use a walker ability to climb steps?  no do you drive?  yes  Function retired I need assistance with the following:  household duties and shopping  Neuro/Psych trouble walking  Prior Studies Any changes since last visit?  no  Physicians involved in your care Any changes since last visit?  no   Family History  Problem Relation Age of Onset  . Heart disease Father    Social History   Social History  . Marital status: Widowed    Spouse name: N/A  . Number of children: N/A  . Years of education: N/A   Social History Main Topics  . Smoking status: Never Smoker  . Smokeless tobacco: Never Used  . Alcohol use No  . Drug use: No  . Sexual activity: No   Other Topics Concern  . None   Social History Narrative  . None   Past Surgical History:  Procedure Laterality Date  . ABDOMINAL HYSTERECTOMY     Ovaries intact  . BREAST LUMPECTOMY    . CARPAL TUNNEL RELEASE     right  . CATARACT EXTRACTION, BILATERAL     and lens implants Dr. Katy Fitch 2010  . CHOLECYSTECTOMY    . CHOLECYSTECTOMY    . DILATION AND CURETTAGE OF  UTERUS    . GALLBLADDER SURGERY    . KNEE SURGERY     left torn meniscus  . LUMP IN BREAST     Left Benign  . MENISCUS REPAIR    . RADIOFREQUENCY ABLATION NERVES     by Dr. Letta Pate  . THYROID SURGERY     Partial, left lobe  . TONSILLECTOMY     Past Medical History:  Diagnosis Date  . Arthritis   . Chronic renal insufficiency, stage III (moderate)   . Gout    Dr. Ouida Sills Rheumatologist  . Hyperlipidemia   . Hypertension   . Left humeral fracture Category 01/23/2012  . Nephrosclerosis    prn, Dr. Moshe Cipro  . Osteoarthritis   . Osteopenia   . Spinal stenosis    lumbar, bulging disc, receiving injections 01-01-2011,02-12-2011, Dr. Letta Pate (pain management)  . Thyroid disease    Hypo   BP 119/66   Pulse 87   SpO2 94%   Opioid Risk Score:   Fall Risk Score:  `1  Depression screen PHQ 2/9  Depression screen [facility] 2/9 06/12/2016 04/01/2016 03/04/2016 12/06/2015 10/23/2015 06/11/2015  Decreased Interest 0 0 0 0 0 0  Down, Depressed, Hopeless 0 0 0 0 0 0  PHQ - 2 Score 0 0 0 0  0 0   Review of Systems  Constitutional: Negative.   HENT: Negative.   Eyes: Negative.   Respiratory: Negative.   Cardiovascular: Negative.   Gastrointestinal: Negative.   Endocrine: Negative.   Genitourinary: Negative.   Musculoskeletal: Positive for gait problem.  Skin: Negative.   Allergic/Immunologic: Negative.   Hematological: Negative.   Psychiatric/Behavioral: Negative.   All other systems reviewed and are negative.      Objective:   Physical Exam  Constitutional: She appears well-developed and well-nourished.  HENT:  Head: Normocephalic and atraumatic.  Neck: Normal range of motion. Neck supple.  Cardiovascular: Normal rate and regular rhythm.   Pulmonary/Chest: Effort normal and breath sounds normal.  Musculoskeletal:  Normal Muscle Bulk and Muscle Testing Reveals: Upper Extremities: Right: Full ROM and Muscle Strength 5/5 Left: Decreased ROM: 90 Degrees and Muscle Strength  5/5 Lower Extremities: Full ROM and Muscle Strength 5/5 Left: Lower Extremity Flexion Produces Pain into Left Patella Arises from Table Slowly using walker for support Antalgic gait  Neurological: She is alert.  Skin: Skin is warm and dry.  Psychiatric: She has a normal mood and affect.  Nursing note and vitals reviewed.         Assessment & Plan:  1. Lumbar spinal stenosis with right L3-4 radiculitis sensory only: 11/16/2016.  Encouraged to increase activity and use heat therapy.  2. Left hip osteoarthritis: Continue with exercise and voltaren gel. 11/16/2016 3. Chronic pain syndrome: Good Relief with current medication regime .11/16/2016 4.Left Knee Osteoarthritis: 11/16/2016 Refilled: Oxycodone 15 mg one tablet 3 times a day #90 and continue Tramadol 50 mg 1 tablet 4 times a day #120. We will continue the opioid monitoring program, this consists of regular clinic visits, examinations, urine drug screen, pill counts as well as use of [place] Controlled Substance Reporting System.  F/U in 1 month.

## 2016-11-16 NOTE — Telephone Encounter (Signed)
On 11/16/2016 the [place] was reviewed no conflict was seen on the [place] with multiple prescribers. Ms. Holladay has a signed narcotic contract with our office. If there were any discrepancies this would have been reported to her physician.

## 2016-11-24 ENCOUNTER — Other Ambulatory Visit: Payer: Self-pay | Admitting: Physical Medicine & Rehabilitation

## 2016-11-30 DIAGNOSIS — M109 Gout, unspecified: Secondary | ICD-10-CM | POA: Diagnosis not present

## 2016-11-30 DIAGNOSIS — N183 Chronic kidney disease, stage 3 (moderate): Secondary | ICD-10-CM | POA: Diagnosis not present

## 2016-11-30 DIAGNOSIS — E039 Hypothyroidism, unspecified: Secondary | ICD-10-CM | POA: Diagnosis not present

## 2016-11-30 DIAGNOSIS — I10 Essential (primary) hypertension: Secondary | ICD-10-CM | POA: Diagnosis not present

## 2016-12-05 ENCOUNTER — Emergency Department (HOSPITAL_COMMUNITY)
Admission: EM | Admit: 2016-12-05 | Discharge: 2016-12-05 | Disposition: A | Discharge status: Home / Self Care | Payer: Medicare Other | Attending: Emergency Medicine | Admitting: Emergency Medicine

## 2016-12-05 ENCOUNTER — Encounter (HOSPITAL_COMMUNITY): Payer: Self-pay | Admitting: Emergency Medicine

## 2016-12-05 DIAGNOSIS — Y939 Activity, unspecified: Secondary | ICD-10-CM | POA: Insufficient documentation

## 2016-12-05 DIAGNOSIS — N183 Chronic kidney disease, stage 3 (moderate): Secondary | ICD-10-CM | POA: Insufficient documentation

## 2016-12-05 DIAGNOSIS — I129 Hypertensive chronic kidney disease with stage 1 through stage 4 chronic kidney disease, or unspecified chronic kidney disease: Secondary | ICD-10-CM | POA: Diagnosis not present

## 2016-12-05 DIAGNOSIS — Y929 Unspecified place or not applicable: Secondary | ICD-10-CM | POA: Diagnosis not present

## 2016-12-05 DIAGNOSIS — S60221A Contusion of right hand, initial encounter: Secondary | ICD-10-CM | POA: Diagnosis not present

## 2016-12-05 DIAGNOSIS — S81812A Laceration without foreign body, left lower leg, initial encounter: Secondary | ICD-10-CM | POA: Diagnosis not present

## 2016-12-05 DIAGNOSIS — W19XXXA Unspecified fall, initial encounter: Secondary | ICD-10-CM | POA: Insufficient documentation

## 2016-12-05 DIAGNOSIS — R404 Transient alteration of awareness: Secondary | ICD-10-CM | POA: Diagnosis not present

## 2016-12-05 DIAGNOSIS — R531 Weakness: Secondary | ICD-10-CM | POA: Diagnosis not present

## 2016-12-05 DIAGNOSIS — Y999 Unspecified external cause status: Secondary | ICD-10-CM | POA: Insufficient documentation

## 2016-12-05 DIAGNOSIS — S8992XA Unspecified injury of left lower leg, initial encounter: Secondary | ICD-10-CM | POA: Diagnosis present

## 2016-12-05 NOTE — ED Provider Notes (Signed)
[name] Provider Note   CSN: [id number] Arrival date & time: 12/05/16  0844     History   Chief Complaint Chief Complaint  Patient presents with  . Fall    HPI Kari Martinez is a 83 y.o. female.  The history is provided by the patient and medical records.  Fall  Pertinent negatives include no chest pain, no abdominal pain, no headaches and no shortness of breath.    83 year old female with history of arthritis, chronic renal insufficiency, gout, hyperlipidemia, hypertension, osteoarthritis, thyroid disease, presenting to the ED after a fall. Patient reports she felt nauseated this morning so she sat upright in the bed and leaned forward to vomit into a waistband. States she accidentally slid off the bed and landed on her buttocks, but was unable to get up so she called for help.  She denies any head injury loss of consciousness. Patient has no acute complaints here. States she feels fine otherwise.  States she is not entirely sure why she threw up. She denies any abnormal food intake. No sick contacts. No fever or chills. No abdominal pain. No diarrhea.  Past Medical History:  Diagnosis Date  . Arthritis   . Chronic renal insufficiency, stage III (moderate)   . Gout    Dr. Ouida Sills Rheumatologist  . Hyperlipidemia   . Hypertension   . Left humeral fracture Category 01/23/2012  . Nephrosclerosis    prn, Dr. Moshe Cipro  . Osteoarthritis   . Osteopenia   . Spinal stenosis    lumbar, bulging disc, receiving injections 01-01-2011,02-12-2011, Dr. Letta Pate (pain management)  . Thyroid disease    Hypo    Patient Active Problem List   Diagnosis Date Noted  . Primary osteoarthritis of left knee 10/13/2016  . Urinary tract infection 03/26/2015  . ARF (acute renal failure) ([place]) 03/26/2015  . Elevated LFTs 03/26/2015  . Essential (primary) hypertension 03/26/2015  . Osteoarthritis of hip 08/14/2013  . Chronic low back pain 04/24/2013  . Acquired scoliosis 03/28/2013   . Back pain 01/23/2013  . Right lumbar radiculitis 01/23/2013  . Spinal stenosis of lumbar region 01/23/2013  . Lumbar spondylosis 01/12/2012    Past Surgical History:  Procedure Laterality Date  . ABDOMINAL HYSTERECTOMY     Ovaries intact  . BREAST LUMPECTOMY    . CARPAL TUNNEL RELEASE     right  . CATARACT EXTRACTION, BILATERAL     and lens implants Dr. Katy Fitch 2010  . CHOLECYSTECTOMY    . CHOLECYSTECTOMY    . DILATION AND CURETTAGE OF UTERUS    . GALLBLADDER SURGERY    . KNEE SURGERY     left torn meniscus  . LUMP IN BREAST     Left Benign  . MENISCUS REPAIR    . RADIOFREQUENCY ABLATION NERVES     by Dr. Letta Pate  . THYROID SURGERY     Partial, left lobe  . TONSILLECTOMY      OB History    No data available       Home Medications    Prior to Admission medications   Medication Sig Start Date End Date Taking? Authorizing Provider  Ascorbic Acid (VITAMIN C) 500 MG CAPS Take 500 mg by mouth daily.    Historical Provider, MD  calcium carbonate (OS-CAL) 600 MG TABS Take 600 mg by mouth 2 (two) times daily with a meal.    Historical Provider, MD  Cholecalciferol (VITAMIN D3) 2000 units TABS Take 1 tablet by mouth daily.    Historical Provider,  MD  diazepam (VALIUM) 5 MG tablet Take one table by mouth Prior to procedure 01/10/16   Bayard Hugger, NP  diclofenac sodium (VOLTAREN) 1 % GEL APPLY 2 GRAMS EXTERNALLY TO THE AFFECTED AREA FOUR TIMES DAILY 05/29/16   Charlett Blake, MD  ezetimibe-simvastatin (VYTORIN) 10-20 MG per tablet Take 1 tablet by mouth daily with breakfast.    Historical Provider, MD  Febuxostat (ULORIC) 80 MG TABS Take 80 mg by mouth daily.    Historical Provider, MD  fexofenadine (ALLEGRA) 180 MG tablet Take 180 mg by mouth daily.    Historical Provider, MD  gabapentin (NEURONTIN) 100 MG capsule TAKE 2 CAPSULES BY MOUTH THREE TIMES DAILY 11/24/16   Charlett Blake, MD  Glucosamine HCl 1000 MG TABS Take 1,000 mg by mouth 2 (two) times daily.     Historical Provider, MD  ibuprofen (ADVIL,MOTRIN) 200 MG tablet Take 400 mg by mouth every 6 (six) hours as needed for moderate pain.    Historical Provider, MD  levothyroxine (SYNTHROID, LEVOTHROID) 137 MCG tablet Take 137 mcg by mouth daily.    Historical Provider, MD  metoprolol succinate (TOPROL-XL) 25 MG 24 hr tablet TK 1 T PO QD 12/30/15   Historical Provider, MD  Multiple Vitamins-Minerals (MULTIVITAMIN WITH MINERALS) tablet Take 1 tablet by mouth daily.    Historical Provider, MD  oxyCODONE (ROXICODONE) 15 MG immediate release tablet Take 1 tablet (15 mg total) by mouth every 8 (eight) hours as needed for pain. 11/16/16   Bayard Hugger, NP  predniSONE (DELTASONE) 5 MG tablet Take 5 mg by mouth daily.    Historical Provider, MD  torsemide (DEMADEX) 5 MG tablet Take 5 mg by mouth daily.    Historical Provider, MD  traMADol (ULTRAM) 50 MG tablet Take 1 tablet (50 mg total) by mouth 4 (four) times daily. 11/16/16   Bayard Hugger, NP    Family History Family History  Problem Relation Age of Onset  . Heart disease Father     Social History Social History  Substance Use Topics  . Smoking status: Never Smoker  . Smokeless tobacco: Never Used  . Alcohol use No     Allergies   Alendronate sodium; Ketorolac; Ketorolac tromethamine; Mobic [meloxicam]; Nsaids; Risedronate sodium; Septra [sulfamethoxazole-trimethoprim]; Vioxx [rofecoxib]; Allopurinol; and Sulfa antibiotics   Review of Systems Review of Systems  Constitutional: Negative for fever.  Respiratory: Negative for shortness of breath.   Cardiovascular: Negative for chest pain.  Gastrointestinal: Negative for abdominal pain.  Musculoskeletal: Negative for joint swelling.  Neurological: Negative for dizziness, syncope, weakness and headaches.  All other systems reviewed and are negative.    Physical Exam Updated Vital Signs BP 148/81   Pulse 88   Temp 97.7 F (36.5 C) (Oral)   Resp 16   SpO2 91%   Physical Exam    Constitutional: She is oriented to person, place, and time. She appears well-developed and well-nourished.  HENT:  Head: Normocephalic and atraumatic.  Mouth/Throat: Oropharynx is clear and moist.  No visible signs of head trauma  Eyes: Conjunctivae and EOM are normal. Pupils are equal, round, and reactive to light.  Neck: Normal range of motion.  Cardiovascular: Normal rate, regular rhythm and normal heart sounds.   Pulmonary/Chest: Effort normal and breath sounds normal.  Abdominal: Soft. Bowel sounds are normal.  Soft, benign  Musculoskeletal: Normal range of motion.  No C/T/L spine tenderness Bruising of the right dorsal hand which appears old, there is no swelling or bony deformity,  area is nontender Small triangular-shaped skin tear of the left lateral calf with adjacent older appearing skin tear Pelvis is stable, nontender, no leg shortening Able to arrange both hips and knees without difficulty or pain  Neurological: She is alert and oriented to person, place, and time.  Awake, alert, oriented, moving her extremities well, no apparent ataxia, speech is clear and goal oriented  Skin: Skin is warm and dry.  Psychiatric: She has a normal mood and affect.  Nursing note and vitals reviewed.    ED Treatments / Results  Labs (all labs ordered are listed, but only abnormal results are displayed) Labs Reviewed - No data to display  EKG  EKG Interpretation None       Radiology No results found.  Procedures Procedures (including critical care time)  Medications Ordered in ED Medications - No data to display   Initial Impression / Assessment and Plan / ED Course  I have reviewed the triage vital signs and the nursing notes.  Pertinent labs & imaging results that were available during my care of the patient were reviewed by me and considered in my medical decision making (see chart for details).  83 y.o. F here after falling.  Reports he slid off the bed after  vomiting. No head injury loss of consciousness. Patient has no complaints, rather she just could not get up out of the floor and that is why she called for help.  She has a small skin tear of the left lateral calf, exam is otherwise atraumatic. She's not had any further vomiting, denies abdominal pain. Abdomen is soft and benign here.  Tolerating oral fluids well.  At this time, do not feel patient requires further work-up.  Will discharge home and have her follow-up with her primary care doctor.  Discussed plan with patient, she acknowledged understanding and agreed with plan of care.  Return precautions given for new or worsening symptoms.  Case discussed with attending physician, Dr. Zenia Resides, who evaluated patient and agrees with assessment and plan of care.  Final Clinical Impressions(s) / ED Diagnoses   Final diagnoses:  Fall, initial encounter  Noninfected skin tear of left lower extremity, initial encounter    New Prescriptions Discharge Medication List as of 12/05/2016 11:07 AM       Larene Pickett, PA-C 12/05/16 1247

## 2016-12-05 NOTE — Discharge Instructions (Signed)
Follow-up with your primary care doctor. Return here for any new or worsening symptoms.

## 2016-12-05 NOTE — ED Notes (Signed)
Santiago Glad (family member) [phone]

## 2016-12-05 NOTE — ED Notes (Signed)
Bed: WA04 Expected date:  Expected time:  Means of arrival:  Comments: 83 yo fall

## 2016-12-05 NOTE — ED Notes (Signed)
ED Provider at bedside. 

## 2016-12-05 NOTE — ED Triage Notes (Addendum)
Per EMS, patient pressed her alert button and upon EMS arrival patient was laying beside her bed on the floor. Reports 1 episode of emesis during the night. No longer nauseated. C-collar in place by EMS. Denies neck/back pain. Complaining of chronic bilateral leg pain. No new pain. Denies loss of consciousness or hitting head. Patient is from home.

## 2016-12-05 NOTE — ED Notes (Signed)
Discharge instructions and follow up care reviewed with patient. Patient verbalized understanding. 

## 2016-12-05 NOTE — ED Provider Notes (Signed)
Medical screening examination/treatment/procedure(s) were conducted as a shared visit with non-physician practitioner(s) and myself.  I personally evaluated the patient during the encounter.   EKG Interpretation None     83 year old female here after having one episode of emesis prior to arrival. Denies any abdominal pain with that. States she got bed and slipped and fell to the ground. Denies any injury from that event. On exam she is nontender in her back head and C-spine. Her abdomen is soft. Will have oral challenge and likely discharge him   Lacretia Leigh, MD 12/05/16 [phone]

## 2016-12-11 DIAGNOSIS — S80812A Abrasion, left lower leg, initial encounter: Secondary | ICD-10-CM | POA: Diagnosis not present

## 2016-12-14 IMAGING — CR DG KNEE COMPLETE 4+V*R*
5 series · 5 of 5 positions shown · non-contrast
Comparison: None.

CLINICAL DATA: Fall at home this morning.  Right knee pain.

EXAM:
RIGHT KNEE - COMPLETE 4+ VIEW

[x knee ap right (1 of 3)]
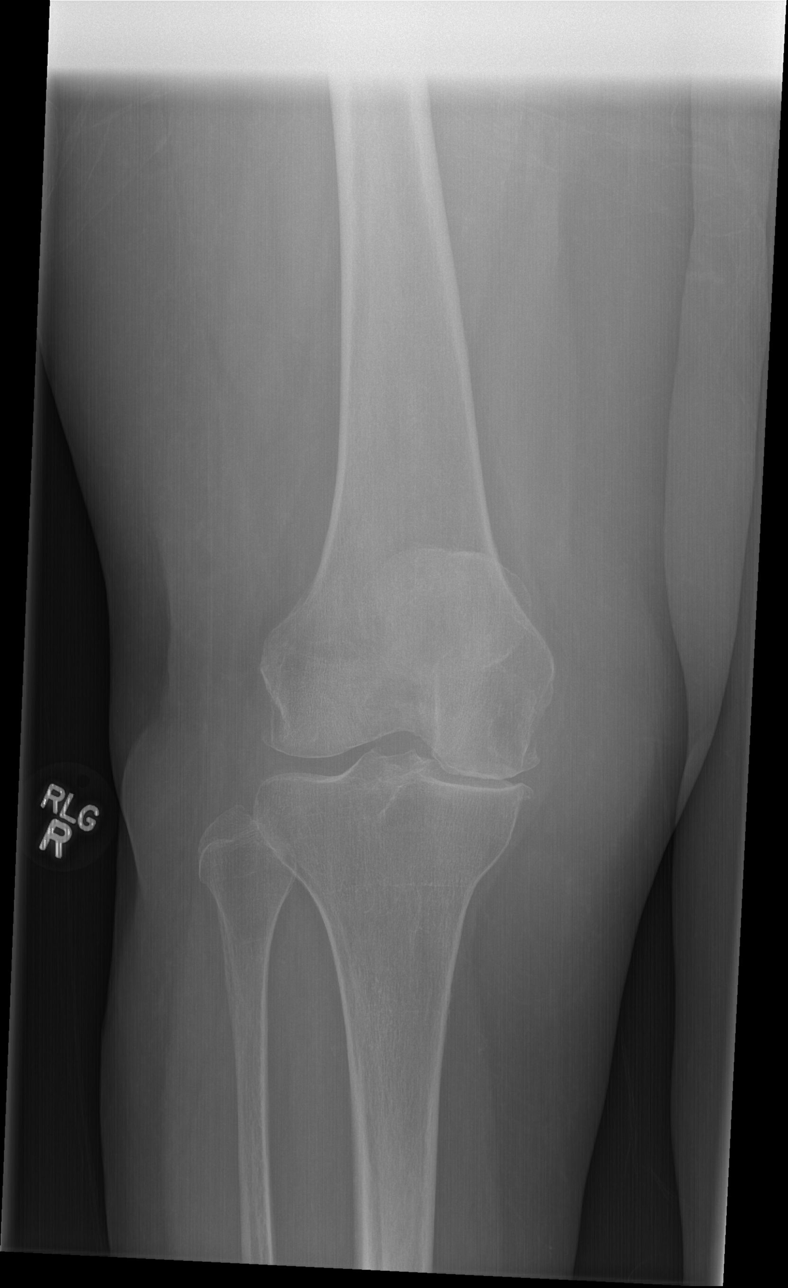

[x knee ap right (2 of 3)]
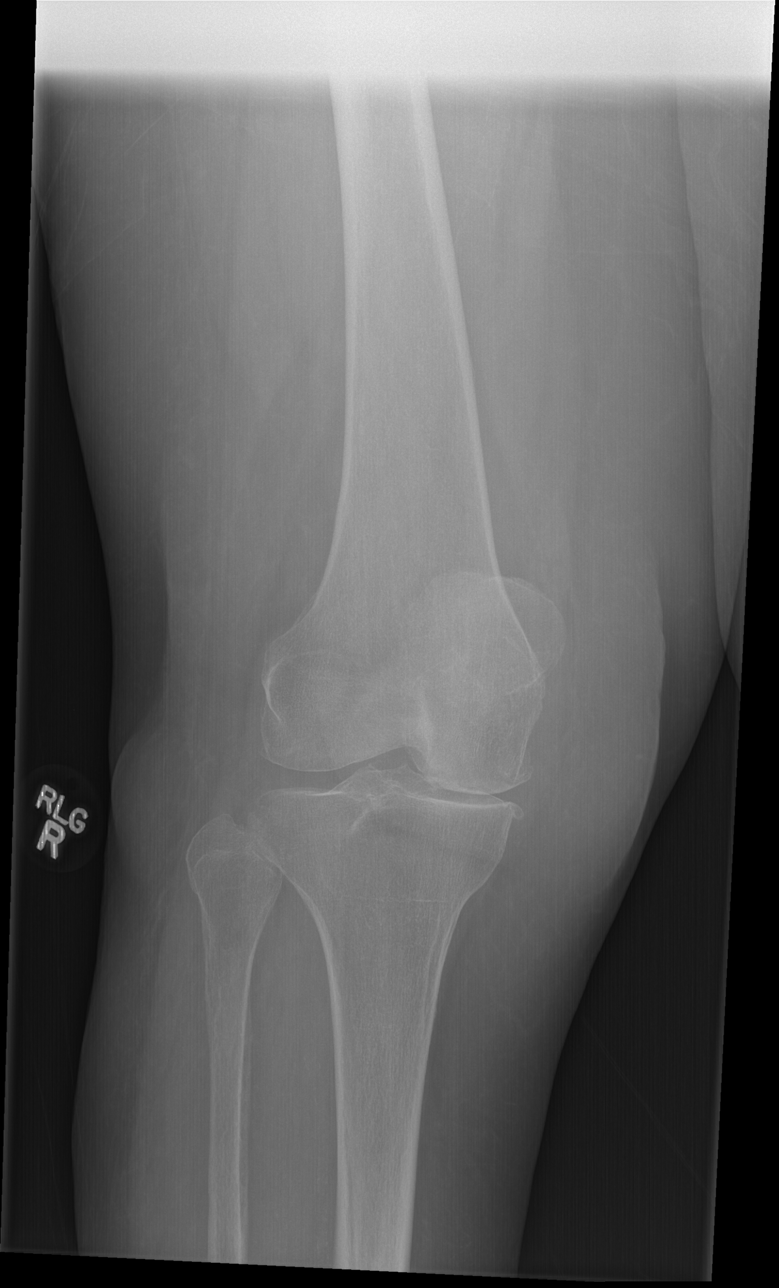

[x knee ap right (3 of 3)]
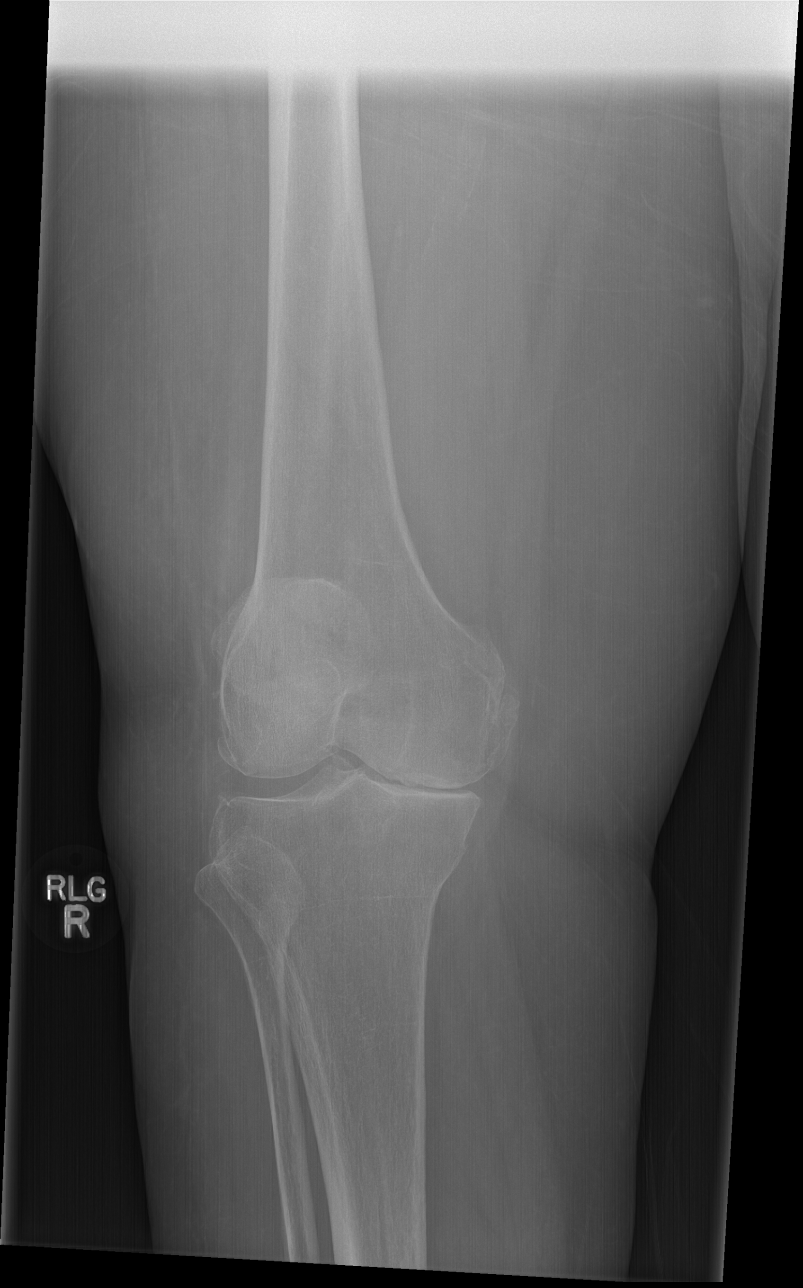

[x knee lat right (1 of 2)]
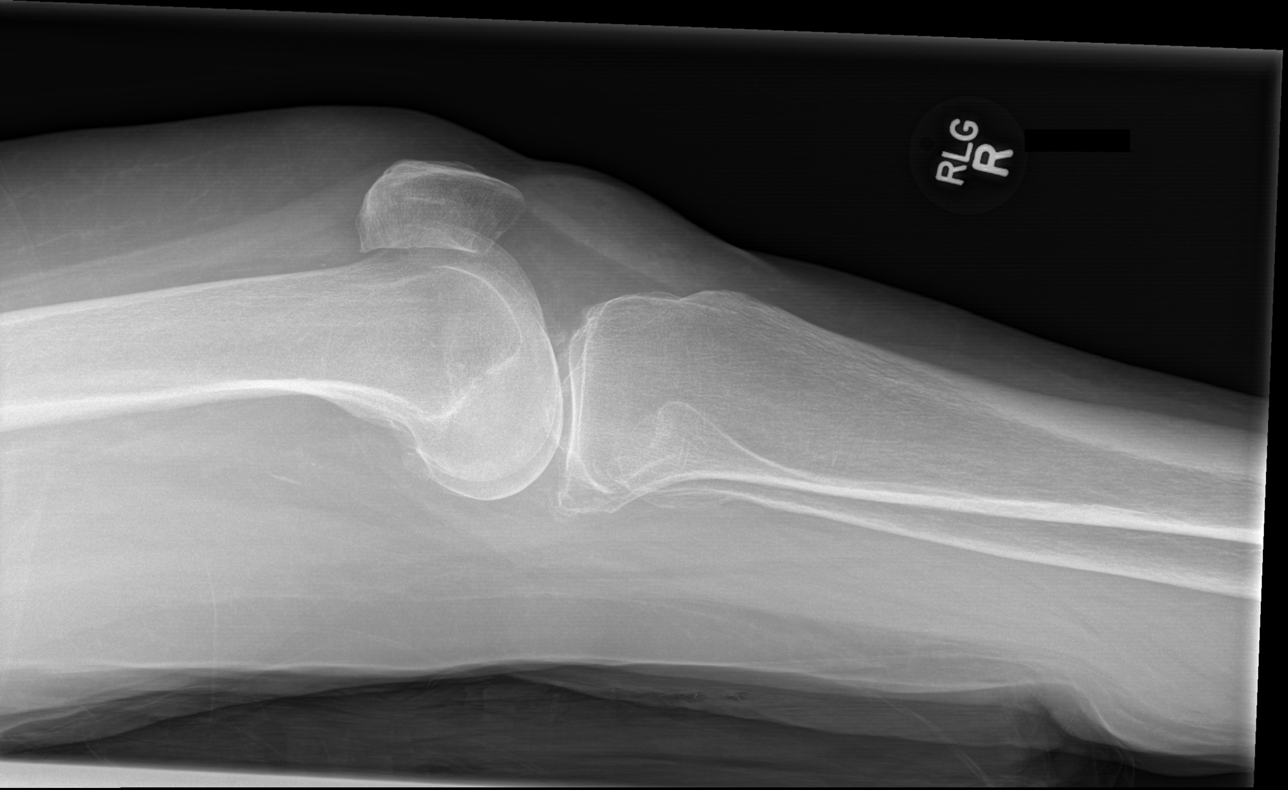

[x knee lat right (2 of 2)]
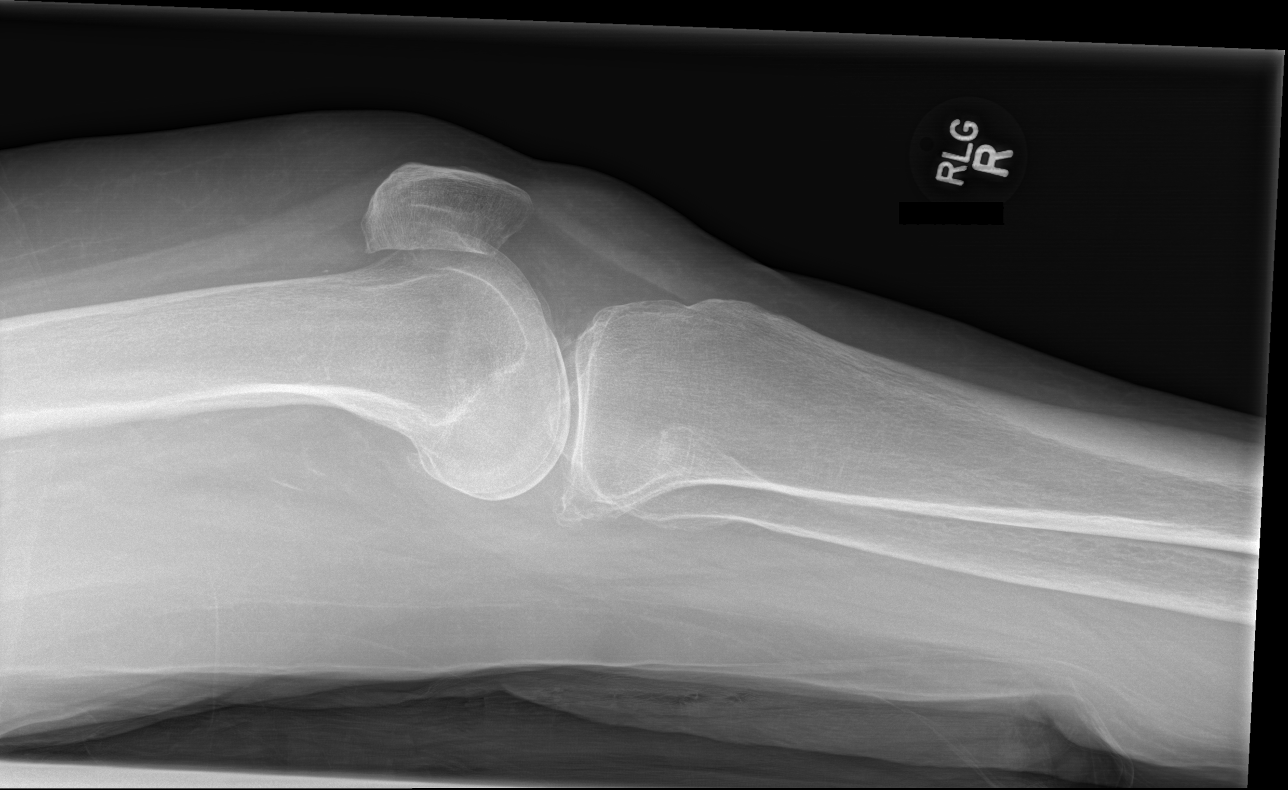

[5 of 5 positions shown; findings below may reference images not displayed]

FINDINGS: Degenerative changes in the medial and patellofemoral compartments
with joint space narrowing and spurring. No joint effusion. No acute
bony abnormality. Specifically, no fracture, subluxation, or
dislocation. Soft tissues are intact.
IMPRESSION: Degenerative changes.  No acute bony abnormality.

## 2016-12-14 IMAGING — CR DG SHOULDER 2+V*R*
4 series · 4 of 4 positions shown · non-contrast
Comparison: None.

CLINICAL DATA: fall this morning.  Right shoulder pain.

EXAM:
RIGHT SHOULDER - 2+ VIEW

[x shoulder ap right (1 of 4)]
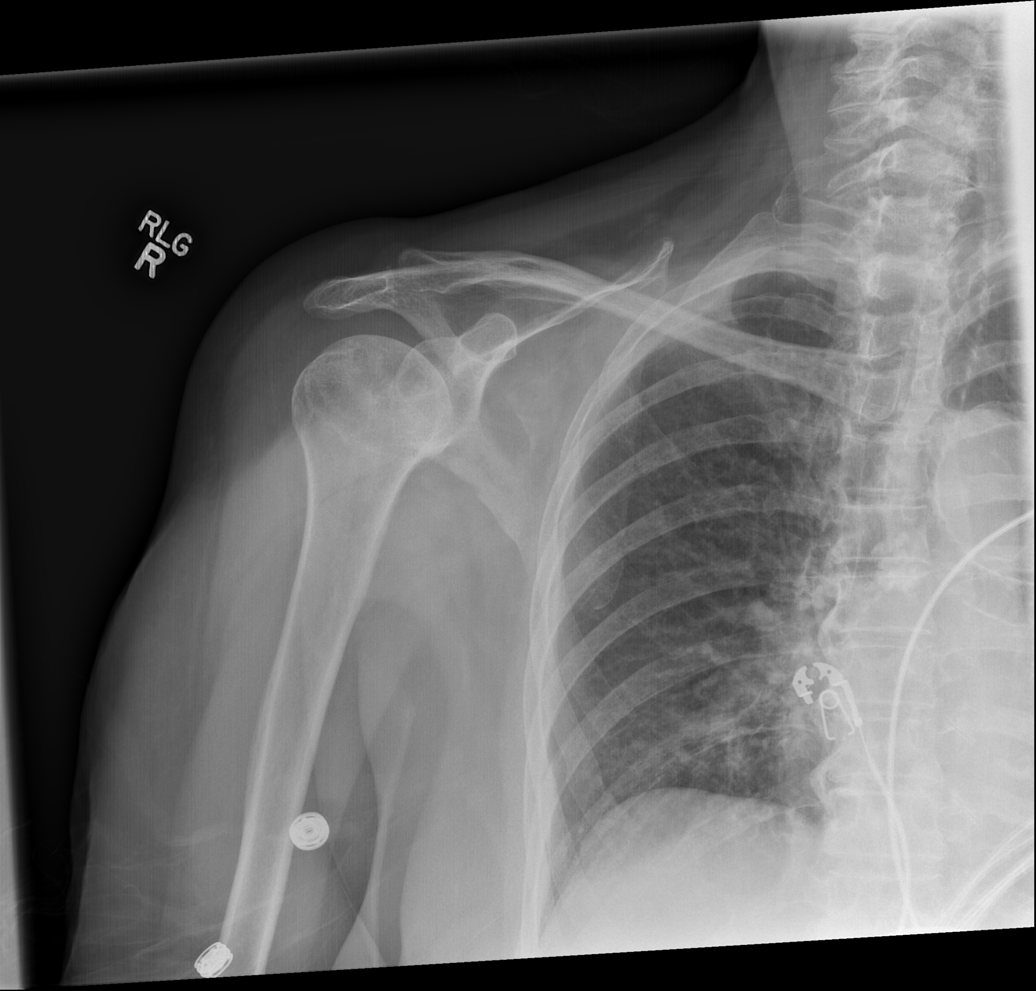

[x shoulder ap right (2 of 4)]
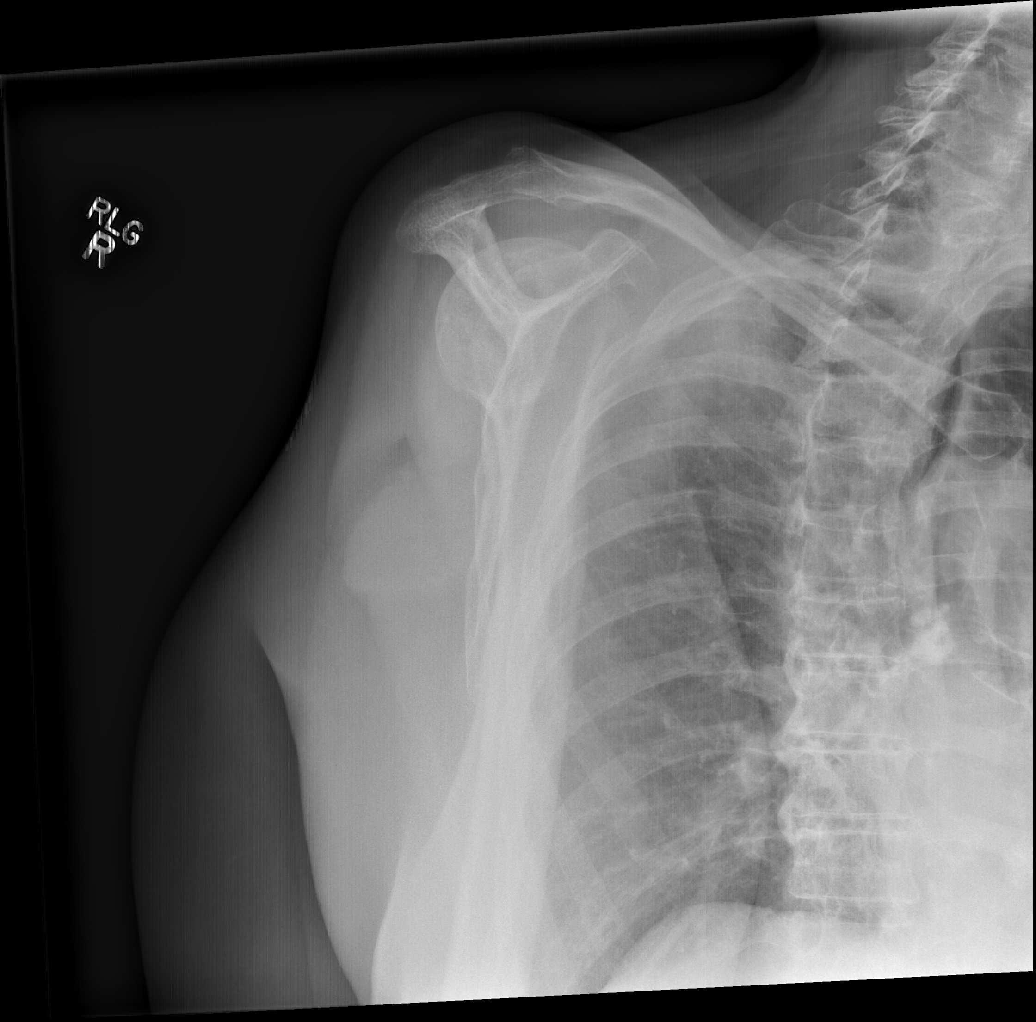

[x shoulder ap right (3 of 4)]
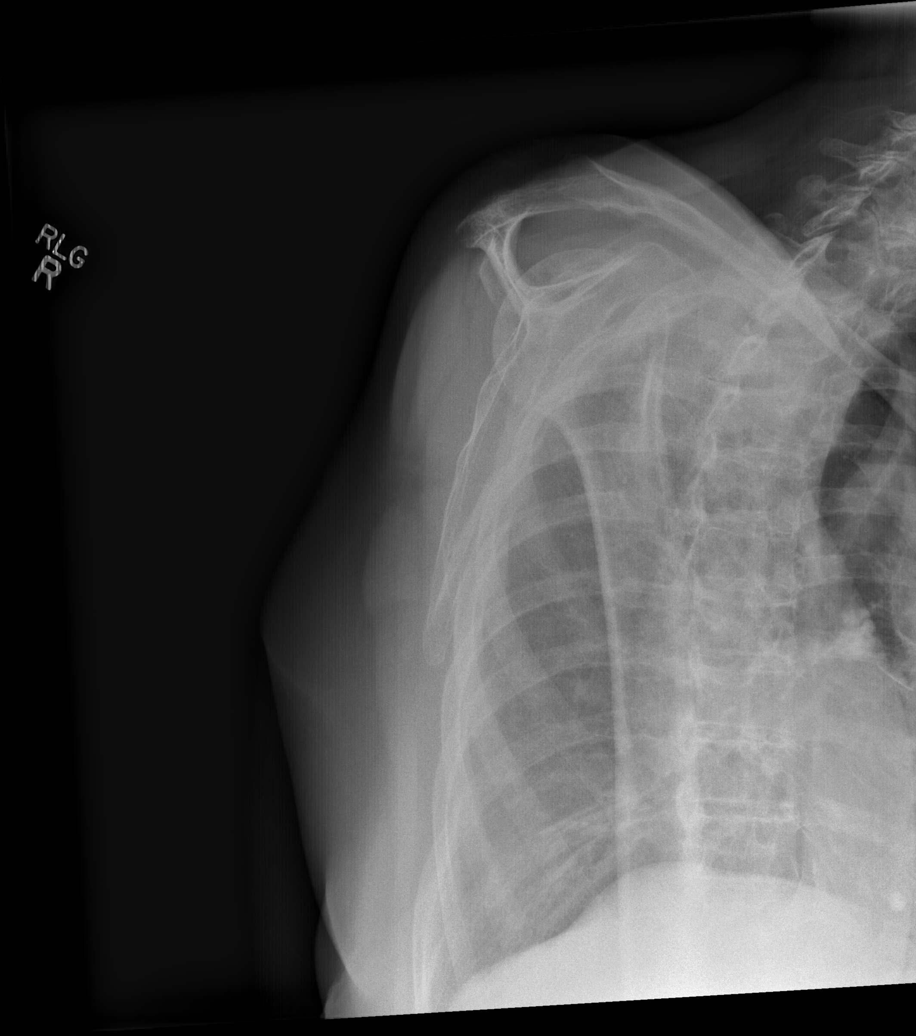

[x shoulder ap right (4 of 4)]
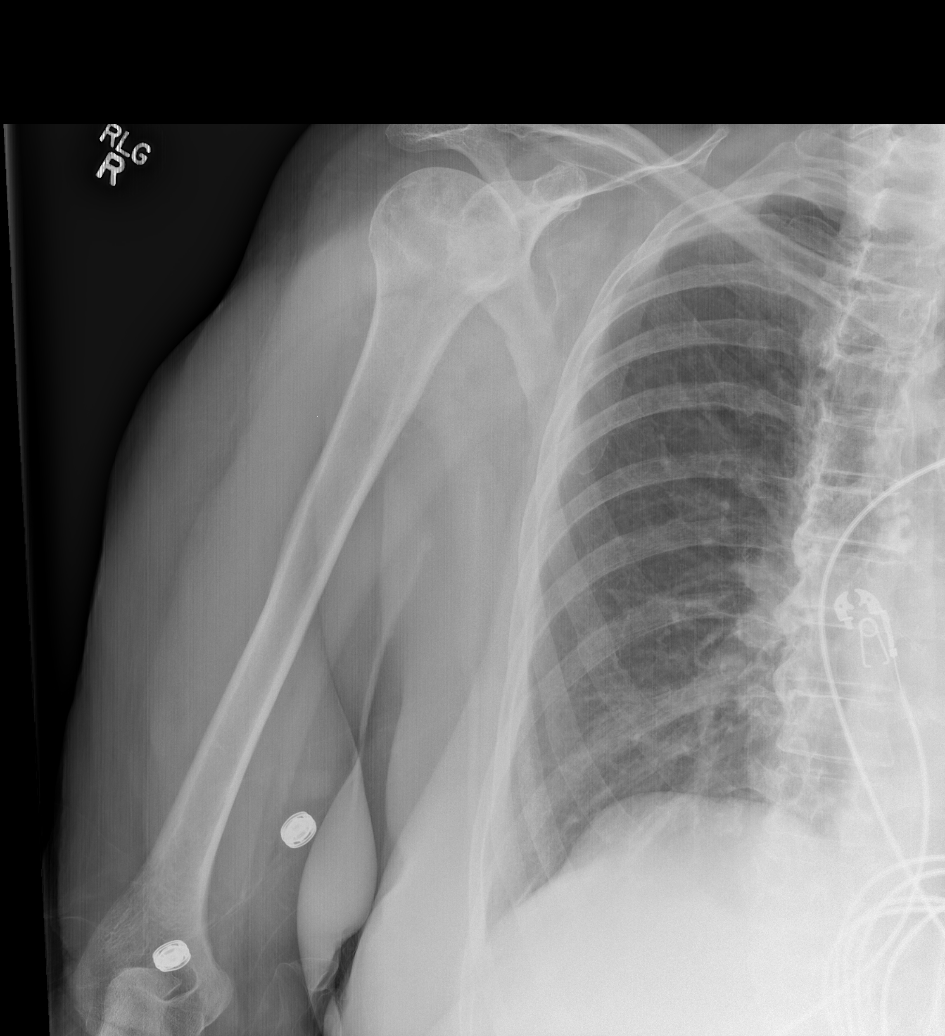

[4 of 4 positions shown; findings below may reference images not displayed]

FINDINGS: Degenerative changes in the right AC and glenohumeral joints with
joint space narrowing. No acute bony abnormality. Specifically, no
fracture, subluxation, or dislocation. Soft tissues are intact.
IMPRESSION: No acute bony abnormality.

## 2016-12-15 ENCOUNTER — Encounter: Payer: Medicare Other | Admitting: Registered Nurse

## 2016-12-21 ENCOUNTER — Encounter: Payer: Self-pay | Admitting: Registered Nurse

## 2016-12-21 ENCOUNTER — Encounter: Payer: Medicare Other | Attending: Physical Medicine & Rehabilitation | Admitting: Registered Nurse

## 2016-12-21 VITALS — BP 130/67 | HR 77

## 2016-12-21 DIAGNOSIS — Z79899 Other long term (current) drug therapy: Secondary | ICD-10-CM

## 2016-12-21 DIAGNOSIS — G894 Chronic pain syndrome: Secondary | ICD-10-CM | POA: Diagnosis present

## 2016-12-21 DIAGNOSIS — M47816 Spondylosis without myelopathy or radiculopathy, lumbar region: Secondary | ICD-10-CM | POA: Diagnosis not present

## 2016-12-21 DIAGNOSIS — M1712 Unilateral primary osteoarthritis, left knee: Secondary | ICD-10-CM | POA: Diagnosis not present

## 2016-12-21 DIAGNOSIS — M1612 Unilateral primary osteoarthritis, left hip: Secondary | ICD-10-CM

## 2016-12-21 DIAGNOSIS — Z5181 Encounter for therapeutic drug level monitoring: Secondary | ICD-10-CM

## 2016-12-21 MED ORDER — OXYCODONE HCL 15 MG PO TABS: 15.0000 mg | ORAL_TABLET | Freq: Three times a day (TID) | ORAL | 0 refills | Status: DC | PRN

## 2016-12-21 NOTE — Progress Notes (Signed)
Subjective:    Patient ID: Kari Martinez, female    DOB: [birth year], 83 y.o.   MRN: [id number]  HPI:  [name] is a 83 year old female who returns for follow up appointmentfor chronic pain and medication refill. She states her pain is located in her lower back radiating into herleft hip and left lower extremity laterally and left knee pain.She ratesher pain 5. Her current exercise regime is walking in her home with her walker.  [name] went to [place] ED on 12/05/16 to be evaluated for post fall, she states she slid off bed and wasn't able to pick herself up.    Pain Inventory Average Pain 5 for post fall, she states she slid off  Pain Right Now 5 My pain is sharp, burning, dull, stabbing, tingling and aching  In the last 24 hours, has pain interfered with the following? General activity 5 Relation with others 7 Enjoyment of life 7 What TIME of day is your pain at its worst? morning and day Sleep (in general) Fair  Pain is worse with: walking and standing Pain improves with: rest, heat/ice and medication Relief from Meds: 5  Mobility use a walker how many minutes can you walk? 5-10 ability to climb steps?  no do you drive?  yes  Function retired I need assistance with the following:  meal prep, household duties and shopping  Neuro/Psych trouble walking  Prior Studies Any changes since last visit?  no  Physicians involved in your care Any changes since last visit?  no   Family History  Problem Relation Age of Onset  . Heart disease Father    Social History   Social History  . Marital status: Widowed    Spouse name: N/A  . Number of children: N/A  . Years of education: N/A   Social History Main Topics  . Smoking status: Never Smoker  . Smokeless tobacco: Never Used  . Alcohol use No  . Drug use: No  . Sexual activity: No   Other Topics Concern  . Not on file   Social History Narrative  . No narrative on file   Past Surgical  History:  Procedure Laterality Date  . ABDOMINAL HYSTERECTOMY     Ovaries intact  . BREAST LUMPECTOMY    . CARPAL TUNNEL RELEASE     right  . CATARACT EXTRACTION, BILATERAL     and lens implants Dr. Katy Fitch 2010  . CHOLECYSTECTOMY    . CHOLECYSTECTOMY    . DILATION AND CURETTAGE OF UTERUS    . GALLBLADDER SURGERY    . KNEE SURGERY     left torn meniscus  . LUMP IN BREAST     Left Benign  . MENISCUS REPAIR    . RADIOFREQUENCY ABLATION NERVES     by Dr. Letta Pate  . THYROID SURGERY     Partial, left lobe  . TONSILLECTOMY     Past Medical History:  Diagnosis Date  . Arthritis   . Chronic renal insufficiency, stage III (moderate)   . Gout    Dr. Ouida Sills Rheumatologist  . Hyperlipidemia   . Hypertension   . Left humeral fracture Category 01/23/2012  . Nephrosclerosis    prn, Dr. Moshe Cipro  . Osteoarthritis   . Osteopenia   . Spinal stenosis    lumbar, bulging disc, receiving injections 01-01-2011,02-12-2011, Dr. Letta Pate (pain management)  . Thyroid disease    Hypo   BP 130/67   Pulse 77   SpO2  95%   Opioid Risk Score:   Fall Risk Score:  `1  Depression screen PHQ 2/9  Depression screen [facility] 2/9 06/12/2016 04/01/2016 03/04/2016 12/06/2015 10/23/2015 06/11/2015  Decreased Interest 0 0 0 0 0 0  Down, Depressed, Hopeless 0 0 0 0 0 0  PHQ - 2 Score 0 0 0 0 0 0    Review of Systems  Constitutional: Negative.   HENT: Negative.   Eyes: Negative.   Respiratory: Negative.   Cardiovascular: Negative.   Gastrointestinal: Negative.   Endocrine: Negative.   Genitourinary: Negative.   Musculoskeletal: Negative.   Skin: Negative.   Allergic/Immunologic: Negative.   Neurological: Negative.   Hematological: Negative.   Psychiatric/Behavioral: Negative.        Objective:   Physical Exam  Constitutional: She is oriented to person, place, and time. She appears well-developed and well-nourished.  HENT:  Head: Normocephalic and atraumatic.  Neck: Normal range of motion.  Neck supple.  Cardiovascular: Normal rate and regular rhythm.   Pulmonary/Chest: Effort normal and breath sounds normal.  Musculoskeletal:  Normal Muscle Bulk and Muscle Testing Reveals: Upper Extremities: Decreased ROM: 45 Degrees and Muscle Strength 4/5 Lumbar Paraspinal Tenderness: L-3-L-5 Lower Extremities: Full ROM and Muscle Strength 5/5 Left Lower Extremity Flexion Produces Pain into Left Hip Arises from Table slowly using walker for support Antalgic Gait  Neurological: She is alert and oriented to person, place, and time.  Skin: Skin is warm and dry.  Psychiatric: She has a normal mood and affect.  Nursing note and vitals reviewed.         Assessment & Plan:  1. Lumbar spinal stenosis with right L3-4 radiculitis sensory only: 12/21/2016.  Encouraged to increase activity and use heat therapy.  2. Left hip osteoarthritis: Continue with exercise and voltaren gel. 12/21/2016 3. Chronic pain syndrome: Good Relief with current medication regime .12/21/2016 4.Left Knee Osteoarthritis: 12/21/2016 Refilled: Oxycodone 15 mg one tablet 3 times a day #90 and continue Tramadol 50 mg 1 tablet 4 times a day #120. We will continue the opioid monitoring program, this consists of regular clinic visits, examinations, urine drug screen, pill counts as well as use of [place] Controlled Substance Reporting System.  20 minutes of face to face patient care time was spent during this visit. All questions were encouraged and answered.   F/U in 1 month.

## 2016-12-23 LAB — TOXASSURE SELECT,+ANTIDEPR,UR

## 2016-12-28 ENCOUNTER — Observation Stay (HOSPITAL_COMMUNITY): Payer: Medicare Other

## 2016-12-28 ENCOUNTER — Emergency Department (HOSPITAL_COMMUNITY): Payer: Medicare Other

## 2016-12-28 ENCOUNTER — Observation Stay (HOSPITAL_BASED_OUTPATIENT_CLINIC_OR_DEPARTMENT_OTHER)
Admit: 2016-12-28 | Discharge: 2016-12-28 | Disposition: A | Discharge status: Home / Self Care | Payer: Medicare Other | Attending: Internal Medicine | Admitting: Internal Medicine

## 2016-12-28 ENCOUNTER — Inpatient Hospital Stay (HOSPITAL_COMMUNITY)
Admission: EM | Admit: 2016-12-28 | Discharge: 2016-12-31 | DRG: 175 | Disposition: A | Discharge status: Skilled Nursing Facility | Payer: Medicare Other | Attending: Internal Medicine | Admitting: Internal Medicine

## 2016-12-28 ENCOUNTER — Encounter (HOSPITAL_COMMUNITY): Payer: Self-pay | Admitting: Emergency Medicine

## 2016-12-28 DIAGNOSIS — R627 Adult failure to thrive: Secondary | ICD-10-CM | POA: Diagnosis not present

## 2016-12-28 DIAGNOSIS — Z9842 Cataract extraction status, left eye: Secondary | ICD-10-CM

## 2016-12-28 DIAGNOSIS — B964 Proteus (mirabilis) (morganii) as the cause of diseases classified elsewhere: Secondary | ICD-10-CM | POA: Diagnosis present

## 2016-12-28 DIAGNOSIS — B9620 Unspecified Escherichia coli [E. coli] as the cause of diseases classified elsewhere: Secondary | ICD-10-CM | POA: Diagnosis present

## 2016-12-28 DIAGNOSIS — Z9841 Cataract extraction status, right eye: Secondary | ICD-10-CM

## 2016-12-28 DIAGNOSIS — Z1623 Resistance to quinolones and fluoroquinolones: Secondary | ICD-10-CM | POA: Diagnosis present

## 2016-12-28 DIAGNOSIS — Z9049 Acquired absence of other specified parts of digestive tract: Secondary | ICD-10-CM

## 2016-12-28 DIAGNOSIS — I130 Hypertensive heart and chronic kidney disease with heart failure and stage 1 through stage 4 chronic kidney disease, or unspecified chronic kidney disease: Secondary | ICD-10-CM | POA: Diagnosis present

## 2016-12-28 DIAGNOSIS — Z961 Presence of intraocular lens: Secondary | ICD-10-CM | POA: Diagnosis present

## 2016-12-28 DIAGNOSIS — Z79891 Long term (current) use of opiate analgesic: Secondary | ICD-10-CM

## 2016-12-28 DIAGNOSIS — R609 Edema, unspecified: Secondary | ICD-10-CM

## 2016-12-28 DIAGNOSIS — R600 Localized edema: Secondary | ICD-10-CM | POA: Diagnosis not present

## 2016-12-28 DIAGNOSIS — M169 Osteoarthritis of hip, unspecified: Secondary | ICD-10-CM | POA: Diagnosis present

## 2016-12-28 DIAGNOSIS — L89313 Pressure ulcer of right buttock, stage 3: Secondary | ICD-10-CM | POA: Diagnosis present

## 2016-12-28 DIAGNOSIS — I2699 Other pulmonary embolism without acute cor pulmonale: Secondary | ICD-10-CM | POA: Diagnosis not present

## 2016-12-28 DIAGNOSIS — R0600 Dyspnea, unspecified: Secondary | ICD-10-CM | POA: Diagnosis not present

## 2016-12-28 DIAGNOSIS — Z66 Do not resuscitate: Secondary | ICD-10-CM | POA: Diagnosis present

## 2016-12-28 DIAGNOSIS — Z886 Allergy status to analgesic agent status: Secondary | ICD-10-CM

## 2016-12-28 DIAGNOSIS — Z7952 Long term (current) use of systemic steroids: Secondary | ICD-10-CM

## 2016-12-28 DIAGNOSIS — Z881 Allergy status to other antibiotic agents status: Secondary | ICD-10-CM

## 2016-12-28 DIAGNOSIS — N184 Chronic kidney disease, stage 4 (severe): Secondary | ICD-10-CM

## 2016-12-28 DIAGNOSIS — Z791 Long term (current) use of non-steroidal anti-inflammatories (NSAID): Secondary | ICD-10-CM

## 2016-12-28 DIAGNOSIS — R0602 Shortness of breath: Secondary | ICD-10-CM | POA: Diagnosis not present

## 2016-12-28 DIAGNOSIS — R069 Unspecified abnormalities of breathing: Secondary | ICD-10-CM | POA: Diagnosis not present

## 2016-12-28 DIAGNOSIS — L89323 Pressure ulcer of left buttock, stage 3: Secondary | ICD-10-CM | POA: Diagnosis present

## 2016-12-28 DIAGNOSIS — M1712 Unilateral primary osteoarthritis, left knee: Secondary | ICD-10-CM | POA: Diagnosis present

## 2016-12-28 DIAGNOSIS — E785 Hyperlipidemia, unspecified: Secondary | ICD-10-CM | POA: Diagnosis present

## 2016-12-28 DIAGNOSIS — Z882 Allergy status to sulfonamides status: Secondary | ICD-10-CM

## 2016-12-28 DIAGNOSIS — I10 Essential (primary) hypertension: Secondary | ICD-10-CM | POA: Diagnosis not present

## 2016-12-28 DIAGNOSIS — Z8249 Family history of ischemic heart disease and other diseases of the circulatory system: Secondary | ICD-10-CM

## 2016-12-28 DIAGNOSIS — L8990 Pressure ulcer of unspecified site, unspecified stage: Secondary | ICD-10-CM | POA: Diagnosis present

## 2016-12-28 DIAGNOSIS — Z79899 Other long term (current) drug therapy: Secondary | ICD-10-CM

## 2016-12-28 DIAGNOSIS — E039 Hypothyroidism, unspecified: Secondary | ICD-10-CM | POA: Diagnosis present

## 2016-12-28 DIAGNOSIS — N390 Urinary tract infection, site not specified: Secondary | ICD-10-CM | POA: Diagnosis present

## 2016-12-28 DIAGNOSIS — Z888 Allergy status to other drugs, medicaments and biological substances status: Secondary | ICD-10-CM

## 2016-12-28 DIAGNOSIS — I5033 Acute on chronic diastolic (congestive) heart failure: Secondary | ICD-10-CM | POA: Diagnosis present

## 2016-12-28 DIAGNOSIS — I2609 Other pulmonary embolism with acute cor pulmonale: Secondary | ICD-10-CM | POA: Diagnosis not present

## 2016-12-28 DIAGNOSIS — R0609 Other forms of dyspnea: Secondary | ICD-10-CM | POA: Diagnosis not present

## 2016-12-28 DIAGNOSIS — M109 Gout, unspecified: Secondary | ICD-10-CM | POA: Diagnosis present

## 2016-12-28 DIAGNOSIS — M1610 Unilateral primary osteoarthritis, unspecified hip: Secondary | ICD-10-CM | POA: Diagnosis present

## 2016-12-28 DIAGNOSIS — M1612 Unilateral primary osteoarthritis, left hip: Secondary | ICD-10-CM | POA: Diagnosis not present

## 2016-12-28 LAB — URINALYSIS, ROUTINE W REFLEX MICROSCOPIC
Bilirubin Urine: NEGATIVE
Glucose, UA: NEGATIVE mg/dL
Ketones, ur: NEGATIVE mg/dL
NITRITE: NEGATIVE
PH: 5 (ref 5.0–8.0)
Protein, ur: 100 mg/dL — AB
Specific Gravity, Urine: 1.019 (ref 1.005–1.030)

## 2016-12-28 LAB — PROTIME-INR
INR: 1.19
Prothrombin Time: 15.1 seconds (ref 11.4–15.2)

## 2016-12-28 LAB — CBC WITH DIFFERENTIAL/PLATELET
Basophils Absolute: 0 10*3/uL (ref 0.0–0.1)
Basophils Relative: 0 %
EOS ABS: 0 10*3/uL (ref 0.0–0.7)
EOS PCT: 1 %
HCT: 37.7 % (ref 36.0–46.0)
Hemoglobin: 12.2 g/dL (ref 12.0–15.0)
LYMPHS ABS: 1.5 10*3/uL (ref 0.7–4.0)
Lymphocytes Relative: 20 %
MCH: 29.8 pg (ref 26.0–34.0)
MCHC: 32.4 g/dL (ref 30.0–36.0)
MCV: 92 fL (ref 78.0–100.0)
MONO ABS: 0.7 10*3/uL (ref 0.1–1.0)
MONOS PCT: 10 %
Neutro Abs: 5.4 10*3/uL (ref 1.7–7.7)
Neutrophils Relative %: 69 %
PLATELETS: 193 10*3/uL (ref 150–400)
RBC: 4.1 MIL/uL (ref 3.87–5.11)
RDW: 14.1 % (ref 11.5–15.5)
WBC: 7.7 10*3/uL (ref 4.0–10.5)

## 2016-12-28 LAB — TROPONIN I
Troponin I: 0.13 ng/mL (ref <0.03)
Troponin I: 0.12 ng/mL (ref <0.03)
Troponin I: 0.1 ng/mL (ref <0.03)

## 2016-12-28 LAB — APTT: APTT: 124 s — AB (ref 24–36)

## 2016-12-28 LAB — BASIC METABOLIC PANEL
Anion gap: 9 (ref 5–15)
BUN: 34 mg/dL — ABNORMAL HIGH (ref 6–20)
CO2: 21 mmol/L — ABNORMAL LOW (ref 22–32)
Calcium: 8.6 mg/dL — ABNORMAL LOW (ref 8.9–10.3)
Chloride: 110 mmol/L (ref 101–111)
Creatinine, Ser: 2.07 mg/dL — ABNORMAL HIGH (ref 0.44–1.00)
GFR calc Af Amer: 23 mL/min — ABNORMAL LOW (ref >60)
GFR calc non Af Amer: 20 mL/min — ABNORMAL LOW (ref >60)
Glucose, Bld: 112 mg/dL — ABNORMAL HIGH (ref 65–99)
Potassium: 3.9 mmol/L (ref 3.5–5.1)
Sodium: 140 mmol/L (ref 135–145)

## 2016-12-28 LAB — BRAIN NATRIURETIC PEPTIDE: B Natriuretic Peptide: 766 pg/mL — ABNORMAL HIGH (ref 0.0–100.0)

## 2016-12-28 LAB — D-DIMER, QUANTITATIVE: D-Dimer, Quant: 8.09 ug/mL-FEU — ABNORMAL HIGH (ref 0.00–0.50)

## 2016-12-28 MED ORDER — PREDNISONE 5 MG PO TABS
5.0000 mg | ORAL_TABLET | Freq: Every day | ORAL | Status: DC
  Administered 2016-12-28 – 2016-12-31 (×4): 5 mg via ORAL
  Filled 2016-12-28 (×4): qty 1

## 2016-12-28 MED ORDER — TRAMADOL HCL 50 MG PO TABS
50.0000 mg | ORAL_TABLET | Freq: Four times a day (QID) | ORAL | Status: DC | PRN
Start: 2016-12-28 — End: 2016-12-31

## 2016-12-28 MED ORDER — SODIUM CHLORIDE 0.9 % IV SOLN
INTRAVENOUS | Status: DC
  Administered 2016-12-28: 10 mL/h via INTRAVENOUS
  Administered 2016-12-29: 18:00:00 via INTRAVENOUS

## 2016-12-28 MED ORDER — OXYCODONE HCL 5 MG PO TABS: 15.0000 mg | ORAL_TABLET | Freq: Three times a day (TID) | ORAL | Status: DC | PRN

## 2016-12-28 MED ORDER — GABAPENTIN 100 MG PO CAPS: 200.0000 mg | ORAL_CAPSULE | Freq: Three times a day (TID) | ORAL | Status: DC | PRN

## 2016-12-28 MED ORDER — HYDROCERIN EX CREA
TOPICAL_CREAM | Freq: Two times a day (BID) | CUTANEOUS | Status: DC
  Administered 2016-12-28 – 2016-12-30 (×3): via TOPICAL
  Filled 2016-12-28: qty 113

## 2016-12-28 MED ORDER — HEPARIN BOLUS VIA INFUSION
2000.0000 [IU] | Freq: Once | INTRAVENOUS | Status: AC
  Administered 2016-12-28: 2000 [IU] via INTRAVENOUS
  Filled 2016-12-28: qty 2000

## 2016-12-28 MED ORDER — DICLOFENAC SODIUM 1 % TD GEL: 2.0000 g | Freq: Four times a day (QID) | TRANSDERMAL | Status: DC | PRN

## 2016-12-28 MED ORDER — TORSEMIDE 10 MG PO TABS
10.0000 mg | ORAL_TABLET | Freq: Every day | ORAL | Status: DC
  Filled 2016-12-28: qty 1

## 2016-12-28 MED ORDER — LEVOTHYROXINE SODIUM 112 MCG PO TABS
112.0000 ug | ORAL_TABLET | Freq: Every day | ORAL | Status: DC
  Administered 2016-12-29 – 2016-12-31 (×3): 112 ug via ORAL
  Filled 2016-12-28 (×3): qty 1

## 2016-12-28 MED ORDER — TECHNETIUM TC 99M DIETHYLENETRIAME-PENTAACETIC ACID: 30.0000 | Freq: Once | INTRAVENOUS | Status: DC | PRN

## 2016-12-28 MED ORDER — TORSEMIDE 5 MG PO TABS
5.0000 mg | ORAL_TABLET | ORAL | Status: DC
  Administered 2016-12-29: 5 mg via ORAL
  Filled 2016-12-28 (×2): qty 1

## 2016-12-28 MED ORDER — TORSEMIDE 10 MG PO TABS
10.0000 mg | ORAL_TABLET | ORAL | Status: DC
  Administered 2016-12-28 – 2016-12-30 (×2): 10 mg via ORAL
  Filled 2016-12-28 (×2): qty 1

## 2016-12-28 MED ORDER — EZETIMIBE-SIMVASTATIN 10-20 MG PO TABS
1.0000 | ORAL_TABLET | Freq: Every day | ORAL | Status: DC
  Administered 2016-12-28 – 2016-12-31 (×4): 1 via ORAL
  Filled 2016-12-28 (×4): qty 1

## 2016-12-28 MED ORDER — SODIUM CHLORIDE 0.9% FLUSH
3.0000 mL | Freq: Two times a day (BID) | INTRAVENOUS | Status: DC
  Administered 2016-12-28 – 2016-12-30 (×3): 3 mL via INTRAVENOUS

## 2016-12-28 MED ORDER — METOPROLOL SUCCINATE ER 25 MG PO TB24
25.0000 mg | ORAL_TABLET | Freq: Every day | ORAL | Status: DC
  Administered 2016-12-28 – 2016-12-31 (×4): 25 mg via ORAL
  Filled 2016-12-28 (×4): qty 1

## 2016-12-28 MED ORDER — TECHNETIUM TO 99M ALBUMIN AGGREGATED: 4.2000 | Freq: Once | INTRAVENOUS | Status: DC | PRN

## 2016-12-28 MED ORDER — HEPARIN SODIUM (PORCINE) 5000 UNIT/ML IJ SOLN: 5000.0000 [IU] | Freq: Three times a day (TID) | INTRAMUSCULAR | Status: DC

## 2016-12-28 MED ORDER — HEPARIN (PORCINE) IN NACL 100-0.45 UNIT/ML-% IJ SOLN
1000.0000 [IU]/h | INTRAMUSCULAR | Status: AC
  Administered 2016-12-28 – 2016-12-29 (×2): 1000 [IU]/h via INTRAVENOUS
  Filled 2016-12-28 (×2): qty 250

## 2016-12-28 MED ORDER — FEBUXOSTAT 40 MG PO TABS
80.0000 mg | ORAL_TABLET | Freq: Every day | ORAL | Status: DC
  Administered 2016-12-28 – 2016-12-30 (×3): 80 mg via ORAL
  Filled 2016-12-28 (×4): qty 2

## 2016-12-28 NOTE — H&P (Signed)
History and Physical    Kari Martinez GYI:[id number] DOB: 01/30/1924 DOA: 12/28/2016  I have briefly reviewed the patient's prior medical records in [facility]  PCP: Reginia Naas, MD  Patient coming from: Home  Chief Complaint: Shortness of breath  HPI: Kari Martinez is a 83 y.o. female with medical history significant of arthritis, prior gout, hypertension, hyperlipidemia, chronic kidney disease stage IV, presents to the emergency room with a chief complaint of shortness of breath and dyspnea on exertion.  Patient is telling me that over the last 3-4 days, she has noticed that every time she tries to get up and move around she becomes significantly short of breath, she is gasping for air and needs to stop any activity and rest.  The shortness of breath usually gets better after 1 or 2 minutes of rest.  Patient's daughter is in the room, and she tells me that patient has been less and less mobile over the last several months.  She was just in the emergency room about 3 weeks ago with a fall, which has been happening at home.  She denies any chest pains or palpitations.  She denies any abdominal pain, nausea or vomiting.  She denies any fever or chills.  She denies any cough or chest congestion.  She has had no sore throat or flulike illness or postnasal drip.  Patient also tells me that in the last several weeks, she has noticed that her legs have become more swollen, left more than the right.  She was placed on torsemide by her PCP, and she has been taking twice as much over the last week, and her swelling has come down.  ED Course: In the emergency room, patient's vital signs are stable, she is stable on room air, blood work reveals elevated creatinine to 2.0, she has an elevated BNP 766 as well as a slightly elevated troponin of 0.13.  Her CBC is normal.  Chest x-ray was normal.  TRH is asked for admission for elevated troponin, BNP and dyspnea on exertion.  Review of Systems: As per  HPI otherwise 10 point review of systems negative.   Past Medical History:  Diagnosis Date  . Arthritis   . Chronic renal insufficiency, stage III (moderate)   . Gout    Dr. Ouida Sills Rheumatologist  . Hyperlipidemia   . Hypertension   . Left humeral fracture Category 01/23/2012  . Nephrosclerosis    prn, Dr. Moshe Cipro  . Osteoarthritis   . Osteopenia   . Spinal stenosis    lumbar, bulging disc, receiving injections 01-01-2011,02-12-2011, Dr. Letta Pate (pain management)  . Thyroid disease    Hypo    Past Surgical History:  Procedure Laterality Date  . ABDOMINAL HYSTERECTOMY     Ovaries intact  . BREAST LUMPECTOMY    . CARPAL TUNNEL RELEASE     right  . CATARACT EXTRACTION, BILATERAL     and lens implants Dr. Katy Fitch 2010  . CHOLECYSTECTOMY    . CHOLECYSTECTOMY    . DILATION AND CURETTAGE OF UTERUS    . GALLBLADDER SURGERY    . KNEE SURGERY     left torn meniscus  . LUMP IN BREAST     Left Benign  . MENISCUS REPAIR    . RADIOFREQUENCY ABLATION NERVES     by Dr. Letta Pate  . THYROID SURGERY     Partial, left lobe  . TONSILLECTOMY       reports that she has never smoked. She has never used  smokeless tobacco. She reports that she does not drink alcohol or use drugs.  Allergies  Allergen Reactions  . Alendronate Sodium Other (See Comments)    Throat swelling  . Ketorolac Other (See Comments)    "Retained fluid in lungs"  . Ketorolac Tromethamine Other (See Comments)    FLUID RETENTION  . Mobic [Meloxicam]     Kidney decline  . Nsaids Other (See Comments)    Elevated kidney function  . Risedronate Sodium Other (See Comments)    Throat swelling and mouth swelling  . Septra [Sulfamethoxazole-Trimethoprim]     Rash  . Vioxx [Rofecoxib]     Kidney decline  . Allopurinol Rash  . Sulfa Antibiotics Rash    Family History  Problem Relation Age of Onset  . Heart disease Father     Prior to Admission medications   Medication Sig Start Date End Date Taking?  Authorizing Provider  Ascorbic Acid (VITAMIN C) 500 MG CAPS Take 500 mg by mouth daily.    Historical Provider, MD  calcium carbonate (OS-CAL) 600 MG TABS Take 600 mg by mouth 2 (two) times daily with a meal.    Historical Provider, MD  Cholecalciferol (VITAMIN D3) 2000 units TABS Take 1 tablet by mouth daily.    Historical Provider, MD  diazepam (VALIUM) 5 MG tablet Take one table by mouth Prior to procedure 01/10/16   Bayard Hugger, NP  diclofenac sodium (VOLTAREN) 1 % GEL APPLY 2 GRAMS EXTERNALLY TO THE AFFECTED AREA FOUR TIMES DAILY 05/29/16   Charlett Blake, MD  ezetimibe-simvastatin (VYTORIN) 10-20 MG per tablet Take 1 tablet by mouth daily with breakfast.    Historical Provider, MD  Febuxostat (ULORIC) 80 MG TABS Take 80 mg by mouth daily.    Historical Provider, MD  fexofenadine (ALLEGRA) 180 MG tablet Take 180 mg by mouth daily.    Historical Provider, MD  gabapentin (NEURONTIN) 100 MG capsule TAKE 2 CAPSULES BY MOUTH THREE TIMES DAILY 11/24/16   Charlett Blake, MD  Glucosamine HCl 1000 MG TABS Take 1,000 mg by mouth 2 (two) times daily.    Historical Provider, MD  ibuprofen (ADVIL,MOTRIN) 200 MG tablet Take 400 mg by mouth every 6 (six) hours as needed for moderate pain.    Historical Provider, MD  levothyroxine (SYNTHROID, LEVOTHROID) 137 MCG tablet Take 112 mcg by mouth daily.     Historical Provider, MD  metoprolol succinate (TOPROL-XL) 25 MG 24 hr tablet TK 2 T PO QD 12/30/15   Historical Provider, MD  Multiple Vitamins-Minerals (MULTIVITAMIN WITH MINERALS) tablet Take 1 tablet by mouth daily.    Historical Provider, MD  oxyCODONE (ROXICODONE) 15 MG immediate release tablet Take 1 tablet (15 mg total) by mouth every 8 (eight) hours as needed for pain. 12/21/16   Bayard Hugger, NP  predniSONE (DELTASONE) 5 MG tablet Take 5 mg by mouth daily.    Historical Provider, MD  torsemide (DEMADEX) 5 MG tablet Take 10 mg by mouth daily.     Historical Provider, MD  traMADol (ULTRAM) 50 MG  tablet Take 1 tablet (50 mg total) by mouth 4 (four) times daily. 11/16/16   Bayard Hugger, NP    Physical Exam: Vitals:   12/28/16 0865 12/28/16 0913 12/28/16 0919 12/28/16 0945  BP:  105/63    Pulse:  (!) 54 95 93  Resp:  16 18   Temp:  97.6 F (36.4 C)    TempSrc:  Oral    SpO2: 98% 93% 97% 94%  Constitutional: NAD, calm, comfortable Vitals:   12/28/16 0907 12/28/16 0913 12/28/16 0919 12/28/16 0945  BP:  105/63    Pulse:  (!) 54 95 93  Resp:  16 18   Temp:  97.6 F (36.4 C)    TempSrc:  Oral    SpO2: 98% 93% 97% 94%   Eyes: PERRL, lids and conjunctivae normal ENMT: Mucous membranes are moist. Posterior pharynx clear of any exudate or lesions.  Neck: normal, supple, no masses Respiratory: clear to auscultation bilaterally, no wheezing, no crackles. Normal respiratory effort. No accessory muscle use.  Cardiovascular: Regular rate and rhythm, no murmurs / rubs / gallops. 1-2 + lower extremity edema, left greater than right . 2+ pedal pulses.  Abdomen: no tenderness, no masses palpated. Bowel sounds positive.  Musculoskeletal: no clubbing / cyanosis. Normal muscle tone.  Skin: Bilateral buttock grade 2-3 pressure ulcers Neurologic: CN 2-12 grossly intact. Strength 5/5 in all 4.  Psychiatric: Normal judgment and insight. Alert and oriented x 3. Normal mood.   Labs on Admission: I have personally reviewed following labs and imaging studies  CBC:  Recent Labs Lab 12/28/16 0947  WBC 7.7  NEUTROABS 5.4  HGB 12.2  HCT 37.7  MCV 92.0  PLT 944   Basic Metabolic Panel:  Recent Labs Lab 12/28/16 0947  NA 140  K 3.9  CL 110  CO2 21*  GLUCOSE 112*  BUN 34*  CREATININE 2.07*  CALCIUM 8.6*   GFR: CrCl cannot be calculated (Unknown ideal weight.). Liver Function Tests: No results for input(s): AST, ALT, ALKPHOS, BILITOT, PROT, ALBUMIN in the last 168 hours. No results for input(s): LIPASE, AMYLASE in the last 168 hours. No results for input(s): AMMONIA in the  last 168 hours. Coagulation Profile: No results for input(s): INR, PROTIME in the last 168 hours. Cardiac Enzymes:  Recent Labs Lab 12/28/16 0947  TROPONINI 0.13*   BNP (last 3 results) No results for input(s): PROBNP in the last 8760 hours. HbA1C: No results for input(s): HGBA1C in the last 72 hours. CBG: No results for input(s): GLUCAP in the last 168 hours. Lipid Profile: No results for input(s): CHOL, HDL, LDLCALC, TRIG, CHOLHDL, LDLDIRECT in the last 72 hours. Thyroid Function Tests: No results for input(s): TSH, T4TOTAL, FREET4, T3FREE, THYROIDAB in the last 72 hours. Anemia Panel: No results for input(s): VITAMINB12, FOLATE, FERRITIN, TIBC, IRON, RETICCTPCT in the last 72 hours. Urine analysis:    Component Value Date/Time   COLORURINE AMBER (A) 06/01/2016 1229   APPEARANCEUR TURBID (A) 06/01/2016 1229   LABSPEC 1.020 06/01/2016 1229   PHURINE 5.0 06/01/2016 1229   GLUCOSEU NEGATIVE 06/01/2016 1229   HGBUR MODERATE (A) 06/01/2016 1229   BILIRUBINUR SMALL (A) 06/01/2016 1229   KETONESUR NEGATIVE 06/01/2016 1229   PROTEINUR 30 (A) 06/01/2016 1229   UROBILINOGEN 1.0 03/26/2015 1521   NITRITE NEGATIVE 06/01/2016 1229   LEUKOCYTESUR LARGE (A) 06/01/2016 1229     Radiological Exams on Admission: Dg Chest 2 View  Result Date: 12/28/2016 CLINICAL DATA:  Worsening shortness of breath over this past several days EXAM: CHEST  2 VIEW COMPARISON:  03/26/2015 FINDINGS: Cardiac shadow is stable. The lungs are well aerated bilaterally. No focal infiltrate or sizable effusion is seen. No bony abnormality is noted. IMPRESSION: No active cardiopulmonary disease. Electronically Signed   By: Inez Catalina M.D.   On: 12/28/2016 09:39    EKG: Independently reviewed.  Sinus rhythm  Assessment/Plan Active Problems:   Osteoarthritis of hip   Essential (primary) hypertension  Primary osteoarthritis of left knee   Dyspnea on exertion   CKD (chronic kidney disease), stage IV (HCC)    Pressure ulcer   Dyspnea on exertion -Patient has lower extremity swelling, however she has no appreciable JVD or crackles on exam, her chest x-ray is clear, I do not suspect fluid overload plays a big role.  Will get a 2D echo to further characterize if she has CHF. -Her d-dimer is quite elevated at 8, she has her left lower extremity more swollen than the right, suspect DVT or PE, obtain lower extremity ultrasound as well as a VQ scan -She has mild elevation of her troponin, however has no anginal symptoms, will cycle cardiac enzymes.  Chronic kidney disease stage IV -Creatinine last couple years has been anywhere between 2 and 3.5, currently is at baseline.  Avoid nephrotoxic agents, unable to do a CT angiogram of the chest  Hypertension -Resume home medications  Pressure ulcers stage 2-3 on buttocks -Patient with limited mobility over the last several months, consult wound -Consult physical therapy, daughter is inquiring with the patient needs assisted living versus SNF level of care  Chronic gout/arthritis -Patient has been on chronic prednisone, continue.  Continue her home pain regimen   DVT prophylaxis: heparin  Code Status: DNR  Family Communication: d/w daughter bedside Disposition Plan: admit to telemetry  Consults called: none      Admission status: observation  At the point of initial evaluation, it is my clinical opinion that admission for OBSERVATION is reasonable and necessary because the patient's presenting complaints in the context of their chronic conditions represent sufficient risk of deterioration or significant morbidity to constitute reasonable grounds for close observation in the hospital setting, but that the patient may be medically stable for discharge from the hospital within 24 to 48 hours.    Marzetta Board, MD [facility] Pager [phone]  If 7PM-7AM, please contact night-coverage [email or web address] Password [facility]  12/28/2016, 11:25 AM

## 2016-12-28 NOTE — Consult Note (Signed)
[place] Nurse wound consult note Reason for Consult:Bilateral buttock pressure injuries (Stage 3) with friction injuries within the affected area (jagged appearance).  Left lateral LE with traumatic injury from several weeks ago. Wound type:Pressure with friction and moisture component.  Patient reports staying in her chair for many hours. Trauma at left LE. Pressure Injury POA: Yes.  Stage 3 to Buttocks Measurement: Left LE:  5cm x 2cm with undetermined depth due to the presence of dried blood and serum.  Right buttock:  4cm x 3cm x 0.2cm area; Left Buttock 5cm x 4cm x 0.2cm.   Large 12cm x 14cm area of deeper hue surrounds the buttocks, however this area blanches. Bilateral heels with blanchable erythema, feet cool to touch, edema in bilateral LEs. Wound bed: Buttocks:  red, moist with previously mentioned "jagged" appearance frequently associated with repeated trauma, friction. LE as described above. Drainage (amount, consistency, odor) serous drainage on patient's undergarments in a moderate amount.  Small amount of serosanguinous exudate (dried) on left LE bandaids Periwound:Intact, very dry LEs.  Heels blanch. Dressing procedure/placement/frequency: We will provide bilateral Prevalon boots, topical care to the Left LE will be white petrolatum, Eucerin cream to be applied to LEs twice daily.  A soft silicone foam dressing to the bilateral buttock denuded areas will be used in conjunction with avoidance of the supine position.   A pressure redistribution chair pad is provided for OOB use. [place] nursing team will not follow, but will remain available to this patient, the nursing and medical teams.  Please re-consult if needed. Thanks, Maudie Flakes, MSN, RN, [place], Arther Abbott  Pager# [phone]

## 2016-12-28 NOTE — ED Notes (Signed)
Bed: KV42 Expected date: 12/28/16 Expected time: 8:55 AM Means of arrival: Ambulance Comments: [facility]

## 2016-12-28 NOTE — Progress Notes (Signed)
Chaplain responded to a consult patient is requesting Advanced Directive.  Chaplain explained paperwork to patient.  Patient is already quite knowledgeable concerning the paperwork and said that it has to be redone because the daughter that previously had her Medical POA has since passed away and now she needs to release this responsibility to another daughter.  Daughter will be visiting.  Chaplain informed the patient that she could get it done while in the hospital if it is before 4PM on tomorrow.  Chaplain left paperwork with patient.  Patient said she believes her daughter has a copy but to leave another copy.      12/28/16 1604  Clinical Encounter Type  Visited With Patient  Visit Type Initial;Social support  Spiritual Encounters  Spiritual Needs Literature (Patient wanted paperwork for Advanced Directive)   [name].

## 2016-12-28 NOTE — Progress Notes (Signed)
ANTICOAGULATION CONSULT NOTE - Initial Consult  Pharmacy Consult for Heparin Indication: pulmonary embolus  Allergies  Allergen Reactions  . Alendronate Sodium Other (See Comments)    Throat swelling  . Ketorolac Other (See Comments)    "Retained fluid in lungs"  . Ketorolac Tromethamine Other (See Comments)    FLUID RETENTION  . Mobic [Meloxicam]     Kidney decline  . Nsaids Other (See Comments)    Elevated kidney function  . Risedronate Sodium Other (See Comments)    Throat swelling and mouth swelling  . Septra [Sulfamethoxazole-Trimethoprim]     Rash  . Vioxx [Rofecoxib]     Kidney decline  . Allopurinol Rash  . Sulfa Antibiotics Rash    Patient Measurements: Height: 5' (152.4 cm) Weight: 168 lb 9.6 oz (76.5 kg) IBW/kg (Calculated) : 45.5 Heparin Dosing Weight: 55 kg  Vital Signs: Temp: 98.3 F (36.8 C) (03/26 1545) Temp Source: Oral (03/26 1545) BP: 145/73 (03/26 1545) Pulse Rate: 87 (03/26 1545)  Labs:  Recent Labs  12/28/16 0947 12/28/16 1229  HGB 12.2  --   HCT 37.7  --   PLT 193  --   CREATININE 2.07*  --   TROPONINI 0.13* 0.12*    Estimated Creatinine Clearance: 16.2 mL/min (A) (by C-G formula based on SCr of 2.07 mg/dL (H)).   Medical History: Past Medical History:  Diagnosis Date  . Arthritis   . Chronic renal insufficiency, stage III (moderate)   . Gout    Dr. Ouida Sills Rheumatologist  . Hyperlipidemia   . Hypertension   . Left humeral fracture Category 01/23/2012  . Nephrosclerosis    prn, Dr. Moshe Cipro  . Osteoarthritis   . Osteopenia   . Spinal stenosis    lumbar, bulging disc, receiving injections 01-01-2011,02-12-2011, Dr. Letta Pate (pain management)  . Thyroid disease    Hypo    Medications:  Scheduled:  . ezetimibe-simvastatin  1 tablet Oral Q breakfast  . febuxostat  80 mg Oral Daily  . heparin  2,000 Units Intravenous Once  . hydrocerin   Topical BID  . [START ON 12/29/2016] levothyroxine  112 mcg Oral QAC breakfast   . metoprolol succinate  25 mg Oral Daily  . predniSONE  5 mg Oral QAC breakfast  . sodium chloride flush  3 mL Intravenous Q12H  . torsemide  10 mg Oral QODAY  . [START ON 12/29/2016] torsemide  5 mg Oral QODAY   Infusions:  . sodium chloride 10 mL/hr (12/28/16 1152)  . heparin      Assessment:  83 yr female presents to ED with c/o shortness of breath.  PMH significant for HTN, HLD, CKD stage IV (Scr currently 2.07)  VQ scan is indeterminate for Pulmonary Embolism  Pharmacy consulted to begin IV heparin  Goal of Therapy:  Heparin level 0.3-0.7 units/ml Monitor platelets by anticoagulation protocol: Yes   Plan:   Obtain baseline aPTT and PT/INR  Using [place] nomogram:  Heparin 2000 unit IV bolus x 1 followed by infusion @ 1000 units/hr  Check heparin level 8 hr after heparin started  Follow daily heparin level & CBC  Marvetta Vohs, Toribio Harbour, PharmD 12/28/2016,4:46 PM

## 2016-12-28 NOTE — Progress Notes (Signed)
VASCULAR LAB PRELIMINARY  PRELIMINARY  PRELIMINARY  PRELIMINARY  Bilateral lower extremity venous duplex completed.    Preliminary report:  Bilateral:  No evidence of DVT, superficial thrombosis, or Baker's Cyst.   Tashica Provencio, RVS 12/28/2016, 1:49 PM

## 2016-12-28 NOTE — ED Provider Notes (Signed)
[place] DEPT Provider Note   CSN: [id number] Arrival date & time: 12/28/16  0906     History   Chief Complaint Chief Complaint  Patient presents with  . Shortness of Breath    HPI HERMELINDA DIEGEL is a 83 y.o. female.  83 year old female presents with shortness of breath times several days which began at rest. States that when she does any type of activity that she becomes more winded. Denies any cough, chest pain, congestion. No lower extremity swelling. No recent blood loss. No prior history of same. Denies any syncope or near-syncope. Symptoms have been persistent and no treatment using them prior to arrival.      Past Medical History:  Diagnosis Date  . Arthritis   . Chronic renal insufficiency, stage III (moderate)   . Gout    Dr. Ouida Sills Rheumatologist  . Hyperlipidemia   . Hypertension   . Left humeral fracture Category 01/23/2012  . Nephrosclerosis    prn, Dr. Moshe Cipro  . Osteoarthritis   . Osteopenia   . Spinal stenosis    lumbar, bulging disc, receiving injections 01-01-2011,02-12-2011, Dr. Letta Pate (pain management)  . Thyroid disease    Hypo    Patient Active Problem List   Diagnosis Date Noted  . Primary osteoarthritis of left knee 10/13/2016  . Urinary tract infection 03/26/2015  . ARF (acute renal failure) ([place]) 03/26/2015  . Elevated LFTs 03/26/2015  . Essential (primary) hypertension 03/26/2015  . Osteoarthritis of hip 08/14/2013  . Chronic low back pain 04/24/2013  . Acquired scoliosis 03/28/2013  . Back pain 01/23/2013  . Right lumbar radiculitis 01/23/2013  . Spinal stenosis of lumbar region 01/23/2013  . Lumbar spondylosis 01/12/2012    Past Surgical History:  Procedure Laterality Date  . ABDOMINAL HYSTERECTOMY     Ovaries intact  . BREAST LUMPECTOMY    . CARPAL TUNNEL RELEASE     right  . CATARACT EXTRACTION, BILATERAL     and lens implants Dr. Katy Fitch 2010  . CHOLECYSTECTOMY    . CHOLECYSTECTOMY    . DILATION AND  CURETTAGE OF UTERUS    . GALLBLADDER SURGERY    . KNEE SURGERY     left torn meniscus  . LUMP IN BREAST     Left Benign  . MENISCUS REPAIR    . RADIOFREQUENCY ABLATION NERVES     by Dr. Letta Pate  . THYROID SURGERY     Partial, left lobe  . TONSILLECTOMY      OB History    No data available       Home Medications    Prior to Admission medications   Medication Sig Start Date End Date Taking? Authorizing Provider  Ascorbic Acid (VITAMIN C) 500 MG CAPS Take 500 mg by mouth daily.    Historical Provider, MD  calcium carbonate (OS-CAL) 600 MG TABS Take 600 mg by mouth 2 (two) times daily with a meal.    Historical Provider, MD  Cholecalciferol (VITAMIN D3) 2000 units TABS Take 1 tablet by mouth daily.    Historical Provider, MD  diazepam (VALIUM) 5 MG tablet Take one table by mouth Prior to procedure 01/10/16   Bayard Hugger, NP  diclofenac sodium (VOLTAREN) 1 % GEL APPLY 2 GRAMS EXTERNALLY TO THE AFFECTED AREA FOUR TIMES DAILY 05/29/16   Charlett Blake, MD  ezetimibe-simvastatin (VYTORIN) 10-20 MG per tablet Take 1 tablet by mouth daily with breakfast.    Historical Provider, MD  Febuxostat (ULORIC) 80 MG TABS Take 80 mg by  mouth daily.    Historical Provider, MD  fexofenadine (ALLEGRA) 180 MG tablet Take 180 mg by mouth daily.    Historical Provider, MD  gabapentin (NEURONTIN) 100 MG capsule TAKE 2 CAPSULES BY MOUTH THREE TIMES DAILY 11/24/16   Charlett Blake, MD  Glucosamine HCl 1000 MG TABS Take 1,000 mg by mouth 2 (two) times daily.    Historical Provider, MD  ibuprofen (ADVIL,MOTRIN) 200 MG tablet Take 400 mg by mouth every 6 (six) hours as needed for moderate pain.    Historical Provider, MD  levothyroxine (SYNTHROID, LEVOTHROID) 137 MCG tablet Take 112 mcg by mouth daily.     Historical Provider, MD  metoprolol succinate (TOPROL-XL) 25 MG 24 hr tablet TK 2 T PO QD 12/30/15   Historical Provider, MD  Multiple Vitamins-Minerals (MULTIVITAMIN WITH MINERALS) tablet Take 1  tablet by mouth daily.    Historical Provider, MD  oxyCODONE (ROXICODONE) 15 MG immediate release tablet Take 1 tablet (15 mg total) by mouth every 8 (eight) hours as needed for pain. 12/21/16   Bayard Hugger, NP  predniSONE (DELTASONE) 5 MG tablet Take 5 mg by mouth daily.    Historical Provider, MD  torsemide (DEMADEX) 5 MG tablet Take 10 mg by mouth daily.     Historical Provider, MD  traMADol (ULTRAM) 50 MG tablet Take 1 tablet (50 mg total) by mouth 4 (four) times daily. 11/16/16   Bayard Hugger, NP    Family History Family History  Problem Relation Age of Onset  . Heart disease Father     Social History Social History  Substance Use Topics  . Smoking status: Never Smoker  . Smokeless tobacco: Never Used  . Alcohol use No     Allergies   Alendronate sodium; Ketorolac; Ketorolac tromethamine; Mobic [meloxicam]; Nsaids; Risedronate sodium; Septra [sulfamethoxazole-trimethoprim]; Vioxx [rofecoxib]; Allopurinol; and Sulfa antibiotics   Review of Systems Review of Systems  All other systems reviewed and are negative.    Physical Exam Updated Vital Signs BP 105/63   Pulse 93   Temp 97.6 F (36.4 C) (Oral)   Resp 18   SpO2 94%   Physical Exam  Constitutional: She is oriented to person, place, and time. She appears well-developed and well-nourished.  Non-toxic appearance. No distress.  HENT:  Head: Normocephalic and atraumatic.  Eyes: Conjunctivae, EOM and lids are normal. Pupils are equal, round, and reactive to light.  Neck: Normal range of motion. Neck supple. No tracheal deviation present. No thyroid mass present.  Cardiovascular: Normal rate, regular rhythm and normal heart sounds.  Exam reveals no gallop.   No murmur heard. Pulmonary/Chest: Effort normal and breath sounds normal. No stridor. No respiratory distress. She has no decreased breath sounds. She has no wheezes. She has no rhonchi. She has no rales.  Abdominal: Soft. Normal appearance and bowel sounds  are normal. She exhibits no distension. There is no tenderness. There is no rebound and no CVA tenderness.  Musculoskeletal: Normal range of motion. She exhibits no edema or tenderness.  Neurological: She is alert and oriented to person, place, and time. She has normal strength. No cranial nerve deficit or sensory deficit. GCS eye subscore is 4. GCS verbal subscore is 5. GCS motor subscore is 6.  Skin: Skin is warm and dry. No abrasion and no rash noted.  Psychiatric: She has a normal mood and affect. Her speech is normal and behavior is normal.  Nursing note and vitals reviewed.    ED Treatments / Results  Labs (  all labs ordered are listed, but only abnormal results are displayed) Labs Reviewed  CBC WITH DIFFERENTIAL/PLATELET  BASIC METABOLIC PANEL  BRAIN NATRIURETIC PEPTIDE  TROPONIN I    EKG  EKG Interpretation  Date/Time:  Monday December 28 2016 09:16:42 EDT Ventricular Rate:  96 PR Interval:    QRS Duration: 110 QT Interval:  349 QTC Calculation: 441 R Axis:   -45 Text Interpretation:  Sinus rhythm Inferior infarct, old Anterior infarct, old Lateral leads are also involved No significant change since last tracing Confirmed by Zenia Resides  MD, Bernetta Sutley ([id number]) on 12/28/2016 9:33:52 AM       Radiology Dg Chest 2 View  Result Date: 12/28/2016 CLINICAL DATA:  Worsening shortness of breath over this past several days EXAM: CHEST  2 VIEW COMPARISON:  03/26/2015 FINDINGS: Cardiac shadow is stable. The lungs are well aerated bilaterally. No focal infiltrate or sizable effusion is seen. No bony abnormality is noted. IMPRESSION: No active cardiopulmonary disease. Electronically Signed   By: Inez Catalina M.D.   On: 12/28/2016 09:39    Procedures Procedures (including critical care time)  Medications Ordered in ED Medications  0.9 %  sodium chloride infusion (not administered)     Initial Impression / Assessment and Plan / ED Course  I have reviewed the triage vital signs and the  nursing notes.  Pertinent labs & imaging results that were available during my care of the patient were reviewed by me and considered in my medical decision making (see chart for details).     Pt w/o chest pain,but notes dyspnea Elevated troponin and BNP noted , will admit to triad  Final Clinical Impressions(s) / ED Diagnoses   Final diagnoses:  None    New Prescriptions New Prescriptions   No medications on file     Lacretia Leigh, MD 12/28/16 1100

## 2016-12-28 NOTE — ED Triage Notes (Addendum)
Per EMS pt c/o SOB x about 4-5 days, worse with exertion. Lung sounds clear. No CP, dizziness, cough. No recent travel.

## 2016-12-28 NOTE — Progress Notes (Addendum)
On arrival to unit patient stated she had not voided since last night at home.  Patient able to void 14ml urine in [facility].  Bladder scan after patient voided showed less than 39ml urine left in bladder.  Urine has been sent for urinalysis.  Will continue to monitor.

## 2016-12-29 ENCOUNTER — Observation Stay (HOSPITAL_COMMUNITY): Payer: Medicare Other

## 2016-12-29 DIAGNOSIS — Z7952 Long term (current) use of systemic steroids: Secondary | ICD-10-CM | POA: Diagnosis not present

## 2016-12-29 DIAGNOSIS — Z9049 Acquired absence of other specified parts of digestive tract: Secondary | ICD-10-CM | POA: Diagnosis not present

## 2016-12-29 DIAGNOSIS — B9620 Unspecified Escherichia coli [E. coli] as the cause of diseases classified elsewhere: Secondary | ICD-10-CM | POA: Diagnosis present

## 2016-12-29 DIAGNOSIS — I5033 Acute on chronic diastolic (congestive) heart failure: Secondary | ICD-10-CM | POA: Diagnosis present

## 2016-12-29 DIAGNOSIS — N390 Urinary tract infection, site not specified: Secondary | ICD-10-CM | POA: Diagnosis not present

## 2016-12-29 DIAGNOSIS — Z9842 Cataract extraction status, left eye: Secondary | ICD-10-CM | POA: Diagnosis not present

## 2016-12-29 DIAGNOSIS — R609 Edema, unspecified: Secondary | ICD-10-CM | POA: Diagnosis present

## 2016-12-29 DIAGNOSIS — R627 Adult failure to thrive: Secondary | ICD-10-CM

## 2016-12-29 DIAGNOSIS — I130 Hypertensive heart and chronic kidney disease with heart failure and stage 1 through stage 4 chronic kidney disease, or unspecified chronic kidney disease: Secondary | ICD-10-CM | POA: Diagnosis present

## 2016-12-29 DIAGNOSIS — E039 Hypothyroidism, unspecified: Secondary | ICD-10-CM | POA: Diagnosis present

## 2016-12-29 DIAGNOSIS — Z791 Long term (current) use of non-steroidal anti-inflammatories (NSAID): Secondary | ICD-10-CM | POA: Diagnosis not present

## 2016-12-29 DIAGNOSIS — M1990 Unspecified osteoarthritis, unspecified site: Secondary | ICD-10-CM | POA: Diagnosis not present

## 2016-12-29 DIAGNOSIS — Z66 Do not resuscitate: Secondary | ICD-10-CM | POA: Diagnosis present

## 2016-12-29 DIAGNOSIS — E785 Hyperlipidemia, unspecified: Secondary | ICD-10-CM | POA: Diagnosis not present

## 2016-12-29 DIAGNOSIS — I5030 Unspecified diastolic (congestive) heart failure: Secondary | ICD-10-CM | POA: Diagnosis not present

## 2016-12-29 DIAGNOSIS — N184 Chronic kidney disease, stage 4 (severe): Secondary | ICD-10-CM

## 2016-12-29 DIAGNOSIS — R600 Localized edema: Secondary | ICD-10-CM

## 2016-12-29 DIAGNOSIS — I2699 Other pulmonary embolism without acute cor pulmonale: Secondary | ICD-10-CM | POA: Diagnosis not present

## 2016-12-29 DIAGNOSIS — I2609 Other pulmonary embolism with acute cor pulmonale: Secondary | ICD-10-CM

## 2016-12-29 DIAGNOSIS — Z961 Presence of intraocular lens: Secondary | ICD-10-CM | POA: Diagnosis present

## 2016-12-29 DIAGNOSIS — R0609 Other forms of dyspnea: Secondary | ICD-10-CM | POA: Diagnosis not present

## 2016-12-29 DIAGNOSIS — B964 Proteus (mirabilis) (morganii) as the cause of diseases classified elsewhere: Secondary | ICD-10-CM | POA: Diagnosis present

## 2016-12-29 DIAGNOSIS — Z79891 Long term (current) use of opiate analgesic: Secondary | ICD-10-CM | POA: Diagnosis not present

## 2016-12-29 DIAGNOSIS — M1610 Unilateral primary osteoarthritis, unspecified hip: Secondary | ICD-10-CM | POA: Diagnosis present

## 2016-12-29 DIAGNOSIS — L89313 Pressure ulcer of right buttock, stage 3: Secondary | ICD-10-CM | POA: Diagnosis present

## 2016-12-29 DIAGNOSIS — I272 Pulmonary hypertension, unspecified: Secondary | ICD-10-CM | POA: Diagnosis not present

## 2016-12-29 DIAGNOSIS — Z1623 Resistance to quinolones and fluoroquinolones: Secondary | ICD-10-CM | POA: Diagnosis present

## 2016-12-29 DIAGNOSIS — L89323 Pressure ulcer of left buttock, stage 3: Secondary | ICD-10-CM | POA: Diagnosis present

## 2016-12-29 DIAGNOSIS — M1612 Unilateral primary osteoarthritis, left hip: Secondary | ICD-10-CM | POA: Diagnosis not present

## 2016-12-29 DIAGNOSIS — M1712 Unilateral primary osteoarthritis, left knee: Secondary | ICD-10-CM | POA: Diagnosis present

## 2016-12-29 DIAGNOSIS — M6281 Muscle weakness (generalized): Secondary | ICD-10-CM | POA: Diagnosis not present

## 2016-12-29 DIAGNOSIS — Z9841 Cataract extraction status, right eye: Secondary | ICD-10-CM | POA: Diagnosis not present

## 2016-12-29 DIAGNOSIS — R0600 Dyspnea, unspecified: Secondary | ICD-10-CM

## 2016-12-29 DIAGNOSIS — I10 Essential (primary) hypertension: Secondary | ICD-10-CM | POA: Diagnosis not present

## 2016-12-29 DIAGNOSIS — M109 Gout, unspecified: Secondary | ICD-10-CM | POA: Diagnosis not present

## 2016-12-29 LAB — ECHOCARDIOGRAM COMPLETE
CHL CUP RV SYS PRESS: 70 mmHg
CHL CUP TV REG PEAK VELOCITY: 372 cm/s
E decel time: 275 msec
EERAT: 9.64
FS: 21 % — AB (ref 28–44)
Height: 60 in
IVS/LV PW RATIO, ED: 0.84
LA diam index: 1.38 cm/m2
LA vol A4C: 39.1 ml
LA vol index: 20.3 mL/m2
LASIZE: 24 mm
LAVOL: 35.3 mL
LEFT ATRIUM END SYS DIAM: 24 mm
LV E/e' medial: 9.64
LV TDI E'LATERAL: 4.68
LV TDI E'MEDIAL: 3.15
LVEEAVG: 9.64
LVELAT: 4.68 cm/s
LVOT VTI: 13.1 cm
LVOT area: 2.54 cm2
LVOTD: 18 mm
LVOTPV: 82.8 cm/s
LVOTSV: 33 mL
MV Dec: 275
MV pk A vel: 72.8 m/s
MV pk E vel: 45.1 m/s
PW: 14.1 mm — AB (ref 0.6–1.1)
RV LATERAL S' VELOCITY: 6.64 cm/s
TRMAXVEL: 372 cm/s
Weight: 2697.6 oz

## 2016-12-29 LAB — CBC
HCT: 37.6 % (ref 36.0–46.0)
Hemoglobin: 12.2 g/dL (ref 12.0–15.0)
MCH: 30.4 pg (ref 26.0–34.0)
MCHC: 32.4 g/dL (ref 30.0–36.0)
MCV: 93.8 fL (ref 78.0–100.0)
Platelets: 205 K/uL (ref 150–400)
RBC: 4.01 MIL/uL (ref 3.87–5.11)
RDW: 14.2 % (ref 11.5–15.5)
WBC: 6.8 K/uL (ref 4.0–10.5)

## 2016-12-29 LAB — COMPREHENSIVE METABOLIC PANEL WITH GFR
ALT: 14 U/L (ref 14–54)
AST: 23 U/L (ref 15–41)
Albumin: 2.8 g/dL — ABNORMAL LOW (ref 3.5–5.0)
Alkaline Phosphatase: 131 U/L — ABNORMAL HIGH (ref 38–126)
Anion gap: 9 (ref 5–15)
BUN: 40 mg/dL — ABNORMAL HIGH (ref 6–20)
CO2: 23 mmol/L (ref 22–32)
Calcium: 8.7 mg/dL — ABNORMAL LOW (ref 8.9–10.3)
Chloride: 109 mmol/L (ref 101–111)
Creatinine, Ser: 2.24 mg/dL — ABNORMAL HIGH (ref 0.44–1.00)
GFR calc Af Amer: 21 mL/min — ABNORMAL LOW
GFR calc non Af Amer: 18 mL/min — ABNORMAL LOW
Glucose, Bld: 117 mg/dL — ABNORMAL HIGH (ref 65–99)
Potassium: 4.3 mmol/L (ref 3.5–5.1)
Sodium: 141 mmol/L (ref 135–145)
Total Bilirubin: 0.9 mg/dL (ref 0.3–1.2)
Total Protein: 5.5 g/dL — ABNORMAL LOW (ref 6.5–8.1)

## 2016-12-29 LAB — TROPONIN I: Troponin I: 0.09 ng/mL (ref <0.03)

## 2016-12-29 LAB — HEPARIN LEVEL (UNFRACTIONATED)
HEPARIN UNFRACTIONATED: 0.51 [IU]/mL (ref 0.30–0.70)
HEPARIN UNFRACTIONATED: 0.41 [IU]/mL (ref 0.30–0.70)

## 2016-12-29 MED ORDER — AMOXICILLIN-POT CLAVULANATE 500-125 MG PO TABS
1.0000 | ORAL_TABLET | Freq: Two times a day (BID) | ORAL | Status: DC
  Administered 2016-12-29 – 2016-12-31 (×4): 500 mg via ORAL
  Filled 2016-12-29 (×5): qty 1

## 2016-12-29 NOTE — Evaluation (Addendum)
Physical Therapy Evaluation Patient Details Name: Kari Martinez MRN: [id number] DOB: 08-19-1924 Today's Date: 12/29/2016   History of Present Illness  Kari Martinez is a 83 y.o. female with medical history significant of arthritis, prior gout, hypertension, hyperlipidemia, chronic kidney disease stage IV, presents to the emergency room with a chief complaint of shortness of breath and dyspnea on exertion.  VQ scan Patchy bilateral correlation perfusion matched defects are present  and indeterminant for PE.  Clinical Impression  The patient ias very deconditioned with increased dyspnea with minimal activity for transfers recliner to bed and back. Each time , the patient was noted to have a "zoning out " spell noted by decreased alertness but eyes open. She reports feeling weak and "OUT of It". Pt admitted with above diagnosis. Pt currently with functional limitations due to the deficits listed below (see PT Problem List).  Pt will benefit from skilled PT to increase their independence and safety with mobility to allow discharge to the venue listed below.    Oxygen sats on RA > 90, HR 56-60, BP 120/64.      Follow Up Recommendations SNF;Supervision/Assistance - 24 hour    Equipment Recommendations  None recommended by PT    Recommendations for Other Services       Precautions / Restrictions Precautions Precautions: Fall Precaution Comments: monitor Sats and HR, not on O2  On Heparin     Mobility  Bed Mobility               General bed mobility comments: in recliner  Transfers Overall transfer level: Needs assistance Equipment used: Rolling walker (2 wheeled) Transfers: Sit to/from [facility] Sit to Stand: Mod assist Stand pivot transfers: Mod assist       General transfer comment: assist to rise from recliner and bed,   Ambulation/Gait Ambulation/Gait assistance: Mod assist Ambulation Distance (Feet): 4 Feet Assistive device: Rolling walker (2  wheeled) Gait Pattern/deviations: Step-to pattern     General Gait Details: small steps from recliner  to bed and back to the recliner. Patient appeared to "zone out" briefly  after each transfer, eyes open but less alert, able to answer then perks up. Oxygen saturation .94%, HR 54. BP 120/64.   Stairs            Wheelchair Mobility    Modified Rankin (Stroke Patients Only)       Balance                                             Pertinent Vitals/Pain Pain Assessment: Faces Faces Pain Scale: Hurts even more Pain Location: back and left leg Pain Descriptors / Indicators: Aching;Discomfort Pain Intervention(s): Limited activity within patient's tolerance;Monitored during session;Premedicated before session    Home Living Family/patient expects to be discharged to:: Skilled nursing facility Living Arrangements: Alone   Type of Home: Apartment Home Access: Elevator     Home Layout: One level Home Equipment: Walker - 4 wheels      Prior Function Level of Independence: Independent with assistive device(s)         Comments: family assisted with groceries.     Hand Dominance        Extremity/Trunk Assessment   Upper Extremity Assessment Upper Extremity Assessment: Generalized weakness    Lower Extremity Assessment Lower Extremity Assessment: Generalized weakness    Cervical / Trunk Assessment  Cervical / Trunk Assessment: Kyphotic;Other exceptions (scoliotic)  Communication   Communication: No difficulties  Cognition Arousal/Alertness: Awake/alert Behavior During Therapy: WFL for tasks assessed/performed Overall Cognitive Status: Within Functional Limits for tasks assessed                                        General Comments      Exercises     Assessment/Plan    PT Assessment Patient needs continued PT services  PT Problem List Decreased strength;Decreased activity tolerance;Decreased mobility;Decreased  balance;Decreased knowledge of precautions;Decreased safety awareness;Cardiopulmonary status limiting activity       PT Treatment Interventions DME instruction;Gait training;Functional mobility training;Therapeutic activities;Patient/family education    PT Goals (Current goals can be found in the Care Plan section)  Acute Rehab PT Goals Patient Stated Goal: to go to rehab near daughter. PT Goal Formulation: With patient/family Time For Goal Achievement: 01/12/17 Potential to Achieve Goals: Good    Frequency Min 3X/week   Barriers to discharge Decreased caregiver support      Co-evaluation               End of Session   Activity Tolerance: Patient limited by fatigue Patient left: in chair;with call bell/phone within reach;with family/visitor present Nurse Communication: Mobility status PT Visit Diagnosis: Unsteadiness on feet (R26.81)    Time: 6415-8309 PT Time Calculation (min) (ACUTE ONLY): 32 min   Charges:   PT Evaluation $PT Eval Moderate Complexity: 1 Procedure PT Treatments $Therapeutic Activity: 8-22 mins   PT G Codes:   PT G-Codes **NOT FOR INPATIENT CLASS** Functional Assessment Tool Used: Clinical judgement;AM-PAC 6 Clicks Basic Mobility Functional Limitation: Mobility: Walking and moving around Mobility: Walking and Moving Around Current Status (M0768): At least 60 percent but less than 80 percent impaired, limited or restricted Mobility: Walking and Moving Around Goal Status [phone]): At least 1 percent but less than 20 percent impaired, limited or restricted    [facility] PT [phone]  Claretha Cooper 12/29/2016, 6:21 PM

## 2016-12-29 NOTE — Progress Notes (Addendum)
PROGRESS NOTE  Kari Martinez:[id number] DOB: 02-Aug-1924 DOA: 12/28/2016 PCP: Reginia Naas, MD   Brief summary:  Chief Complaint: Shortness of breath  HPI: Kari Martinez is a 83 y.o. female with medical history significant of arthritis, prior gout, hypertension, hyperlipidemia, chronic kidney disease stage IV, presents to the emergency room with a chief complaint of shortness of breath and dyspnea on exertion.  Patient is telling me that over the last 3-4 days, she has noticed that every time she tries to get up and move around she becomes significantly short of breath, she is gasping for air and needs to stop any activity and rest.  The shortness of breath usually gets better after 1 or 2 minutes of rest.  Patient's daughter is in the room, and she tells me that patient has been less and less mobile over the last several months.  She was just in the emergency room about 3 weeks ago with a fall, which has been happening at home.  She denies any chest pains or palpitations.  She denies any abdominal pain, nausea or vomiting.  She denies any fever or chills.  She denies any cough or chest congestion.  She has had no sore throat or flulike illness or postnasal drip.  Patient also tells me that in the last several weeks, she has noticed that her legs have become more swollen, left more than the right.  She was placed on torsemide by her PCP, and she has been taking twice as much over the last week, and her swelling has come down.  ED Course: In the emergency room, patient's vital signs are stable, she is stable on room air, blood work reveals elevated creatinine to 2.0, she has an elevated BNP 766 as well as a slightly elevated troponin of 0.13.  Her CBC is normal.  Chest x-ray was normal.  TRH is asked for admission for elevated troponin, BNP and dyspnea on exertion.  HPI/Recap of past 24 hours:  Sitting up in chair, denies chest pain, no cough, no fever Multiple family at  bedside  Assessment/Plan: Active Problems:   Osteoarthritis of hip   Essential (primary) hypertension   Primary osteoarthritis of left knee   Dyspnea on exertion   CKD (chronic kidney disease), stage IV (HCC)   Pressure ulcer   Pulmonary emboli (HCC)  Dyspnea on exertion/bilateral lower extremity edema -per family and Patient, she has been on diuretic for a long time, but the edema is more pronounce a weeks ago, patient report sob, dyspnea seems also to be sudden onset a week ago. She denies chest pain, no cough, no syncope. -she has elevated ddimer of 8, intermediate VQ scan, echo with pulmonary arterial hypertension pa pressure 64mmhg, dilated RV with normal levf. -she has  mid elevation of troponin, clear chest x ray. Venous doppler no DVT. Not able to get CTA due to CKD IV. - clinically can not rule out the possibility of PE, she is stared on heparin drip since admission. - I have discussed with family regarding choice of oral anticoagulation for possible PE, the risk and benefit of coumadin/lovenox and apixaban discussed in detail. Patient and family elected to proceed with apixaban.  -they understand the plan is to continue heparin to finish total of 48hrs then transition to apixaban 10mg  bid for 7 days, then 5mg  bid for  A few months. she need to have repeat renal function monthly , apixaban dose could be further reduced to 2.5bid if renal function worsening,  plan to treat total of 6 months if she can tolerate the treatment.  I have also discussed with pharmacy.  -Recommend repeat echocardiogram in three months to evaluate RV and pulmonary artery pressure.   Bilateral lower extremity edema:  -from chronic lymphadema/venous insufficiency? Or from cor pulmonale? Venous US no DVT. -echo lvef 55-60%, elevated pulmonary artery pressure of 108mmhg, dilated RV --She has mild elevation of her troponin/bnp, however has no anginal symptoms,  -elevated legs, On lasix, monitor cr   Chronic  kidney disease stage IV -Creatinine last couple years has been anywhere between 2 and 3.5, -ua concerning for UTI (many bacteria, WBC TNTC, large leukocyte esterase, negative nitrite), urine culture pending, she denies any urinary symptom, no fever, no leukocytosis, urine culture pending, empirically start on augmentin -  Avoid nephrotoxic agents Monitor cr while on lasix  Hypertension -Resume home medications  Pressure ulcers stage 2-3 on buttocks -Patient with limited mobility over the last several months,  -wound care  consult -PT/SNF placement  Chronic gout/arthritis -Patient has been on chronic prednisone, continue.  Continue her home pain regimen   DVT prophylaxis: heparin drip then apixaban  Code Status: DNR  Family Communication: d/w daughter bedside Disposition Plan: SNF in two days Consults called: none     Procedures:  V/Q scan  Antibiotics:  augmentin from 3/27   Objective: BP 120/68 (BP Location: Right Arm)   Pulse 74   Temp 98.7 F (37.1 C) (Oral)   Resp 16   Ht 5' (1.524 m)   Wt 76.5 kg (168 lb 9.6 oz)   SpO2 98%   BMI 32.93 kg/m   Intake/Output Summary (Last 24 hours) at 12/29/16 1933 Last data filed at 12/29/16 1700  Gross per 24 hour  Intake           906.66 ml  Output              150 ml  Net           756.66 ml   Filed Weights   12/28/16 1545  Weight: 76.5 kg (168 lb 9.6 oz)    Exam:   General:  Frail , NAD, aaox3  Cardiovascular: RRR  Respiratory: CTABL  Abdomen: Soft/ND/NT, positive BS  Musculoskeletal: 3+ pitting  Edema bilateral lower extremity  Neuro: aaox3  Data Reviewed: Basic Metabolic Panel:  Recent Labs Lab 12/28/16 0947 12/29/16 0607  NA 140 141  K 3.9 4.3  CL 110 109  CO2 21* 23  GLUCOSE 112* 117*  BUN 34* 40*  CREATININE 2.07* 2.24*  CALCIUM 8.6* 8.7*   Liver Function Tests:  Recent Labs Lab 12/29/16 0607  AST 23  ALT 14  ALKPHOS 131*  BILITOT 0.9  PROT 5.5*  ALBUMIN 2.8*   No  results for input(s): LIPASE, AMYLASE in the last 168 hours. No results for input(s): AMMONIA in the last 168 hours. CBC:  Recent Labs Lab 12/28/16 0947 12/29/16 0607  WBC 7.7 6.8  NEUTROABS 5.4  --   HGB 12.2 12.2  HCT 37.7 37.6  MCV 92.0 93.8  PLT 193 205   Cardiac Enzymes:    Recent Labs Lab 12/28/16 0947 12/28/16 1229 12/28/16 1911 12/29/16 0011  TROPONINI 0.13* 0.12* 0.10* 0.09*   BNP (last 3 results)  Recent Labs  12/28/16 0947  BNP 766.0*    ProBNP (last 3 results) No results for input(s): PROBNP in the last 8760 hours.  CBG: No results for input(s): GLUCAP in the last 168 hours.  No results  found for this or any previous visit (from the past 240 hour(s)).   Studies: No results found.  Scheduled Meds: . amoxicillin-clavulanate  1 tablet Oral BID WC  . ezetimibe-simvastatin  1 tablet Oral Q breakfast  . febuxostat  80 mg Oral Daily  . hydrocerin   Topical BID  . levothyroxine  112 mcg Oral QAC breakfast  . metoprolol succinate  25 mg Oral Daily  . predniSONE  5 mg Oral QAC breakfast  . sodium chloride flush  3 mL Intravenous Q12H  . torsemide  10 mg Oral QODAY  . torsemide  5 mg Oral QODAY    Continuous Infusions: . sodium chloride 10 mL/hr at 12/29/16 1803  . heparin 1,000 Units/hr (12/29/16 1803)     Time spent: 8mins  Aleksey Newbern MD, PhD  [facility] Pager [phone]. If 7PM-7AM, please contact night-coverage at [email or web address], password [facility] 12/29/2016, 7:33 PM  LOS: 0 days

## 2016-12-29 NOTE — Progress Notes (Signed)
Pharmacy - IV heparin >> Eliquis  Assessment:    Please see note from R. Dolly Rias, PharmD earlier today for full details.  Briefly, 83 y.o. female on heparin IV for PE.   VQ scan indeterminate for PE; RV mildly dilated with reduced function on Echo.  CKD with SCr 2.24 (baseline ~2.0); CrCl 15 ml/min  MD wishes to continue long-term anticoag as outpatient.  Discussed lack of trial/safety data for VTE dosing of DOACs with such poor renal function. MD discussed at length with patient who does not want to take warfarin, however is willing to accept the risk of taking Eliquis with poor renal function.   Plan:   Continue heparin as ordered to complete ~48 hrs tx  Will use full dose when transitioning to Eliquis (10 mg bid) as no data for reducing dose with renal dysfunction in VTE.  Reuel Boom, PharmD, BCPS Pager: [phone] 12/29/2016, 5:03 PM

## 2016-12-29 NOTE — Progress Notes (Signed)
  Echocardiogram 2D Echocardiogram has been performed.  Tresa Res 12/29/2016, 2:04 PM

## 2016-12-29 NOTE — Clinical Social Work Note (Signed)
Clinical Social Work Assessment  Patient Details  Name: Kari Martinez MRN: [id number] Date of Birth: 1924/09/20  Date of referral:  12/29/16               Reason for consult:  Facility Placement                Permission sought to share information with:  Facility Art therapist granted to share information::  Yes, Verbal Permission Granted  Name::        Agency::     Relationship::     Contact Information:     Housing/Transportation Living arrangements for the past 2 months:   (Condo) Source of Information:  Patient, Adult Children Patient Interpreter Needed:  None Criminal Activity/Legal Involvement Pertinent to Current Situation/Hospitalization:  No - Comment as needed Significant Relationships:  Adult Children Lives with:  Self Do you feel safe going back to the place where you live?  No Need for family participation in patient care:  Yes (Comment) Patient's daughter - Kari Martinez [phone])  Care giving concerns:  Patient reported that she can no longer care for herself within her home. Patient resides alone and gets assistance from granddaughter but not as much as she needs. Patient's daughter reports that over the past week patient has been having issues ambulating and issues with shoulders. Patient reports that she is unable to adequately care for herself and wants more assistance.    Social Worker assessment / plan:  CSW spoke with patient/patient's daughter at bedside. Patient's daughter reported that patient recently called her and said she was ready to go into a facility to get more assistance. Patient reports that she has been residing in her current condo for almost 3 years and that she recently lost her primary caregiver in October of 2017. Patient's daughter reported that she wants patient to go to a SNF in [place], [place] where she resides. Patient's daughter reported that patient is interested in [facility]. Patient's daughter  reported that if patient needs to go to ALF that they are interested in [facility] in [place], [place]. [place] awaiting recommendation from PT. CSW will continue to follow patient for discharge planning.   Employment status:  Retired Nurse, adult PT Recommendations:  Not assessed at this time Information / Referral to community resources:   [place]   Patient/Family's Response to care:  Patient and patient's daughter agreeable to SNF.   Patient/Family's Understanding of and Emotional Response to Diagnosis, Current Treatment, and Prognosis:  Patient's daughter reported that patient's diagnosis is unclear and that the patient is still having tests ran. Patient was calm and pleasant when speaking with CSW, as evidenced by continous smile. Patient possessed awareness and insight in regards to need for assistance with daily care.    Emotional Assessment Appearance:  Appears stated age Attitude/Demeanor/Rapport:   (Open) Affect (typically observed):  Accepting, Calm Orientation:  Oriented to Self, Oriented to Place, Oriented to  Time, Oriented to Situation Alcohol / Substance use:  Not Applicable Psych involvement (Current and /or in the community):  No (Comment)  Discharge Needs  Concerns to be addressed:  No discharge needs identified Readmission within the last 30 days:  No Current discharge risk:  None Barriers to Discharge:  No Barriers Identified   Burnis Medin, LCSW 12/29/2016, 12:05 PM

## 2016-12-29 NOTE — Progress Notes (Signed)
CM consult for home health needs. Awaiting recommendations from PT eval to assist with DC planning. CM will continue to follow. Marney Doctor RN,BSN,NCM [phone]

## 2016-12-29 NOTE — Progress Notes (Signed)
[facility] for Heparin Indication: pulmonary embolus  Allergies  Allergen Reactions  . Alendronate Sodium Other (See Comments)    Throat swelling  . Ketorolac Other (See Comments)    "Retained fluid in lungs"  . Ketorolac Tromethamine Other (See Comments)    FLUID RETENTION  . Mobic [Meloxicam]     Kidney decline  . Nsaids Other (See Comments)    Elevated kidney function  . Risedronate Sodium Other (See Comments)    Throat swelling and mouth swelling  . Septra [Sulfamethoxazole-Trimethoprim]     Rash  . Vioxx [Rofecoxib]     Kidney decline  . Allopurinol Rash  . Sulfa Antibiotics Rash    Patient Measurements: Height: 5' (152.4 cm) Weight: 168 lb 9.6 oz (76.5 kg) IBW/kg (Calculated) : 45.5 Heparin Dosing Weight: 55 kg  Vital Signs: Temp: 97.7 F (36.5 C) (03/27 0445) Temp Source: Oral (03/27 0445) BP: 131/89 (03/27 0445) Pulse Rate: 77 (03/27 0445)  Labs:  Recent Labs  12/28/16 0947 12/28/16 1229 12/28/16 1911 12/29/16 0011 12/29/16 0607 12/29/16 0809  HGB 12.2  --   --   --  12.2  --   HCT 37.7  --   --   --  37.6  --   PLT 193  --   --   --  205  --   APTT  --   --  124*  --   --   --   LABPROT  --   --  15.1  --   --   --   INR  --   --  1.19  --   --   --   HEPARINUNFRC  --   --   --  0.41  --  0.51  CREATININE 2.07*  --   --   --  2.24*  --   TROPONINI 0.13* 0.12* 0.10* 0.09*  --   --     Estimated Creatinine Clearance: 15 mL/min (A) (by C-G formula based on SCr of 2.24 mg/dL (H)).   Medical History: Past Medical History:  Diagnosis Date  . Arthritis   . Chronic renal insufficiency, stage III (moderate)   . Gout    Dr. Ouida Sills Rheumatologist  . Hyperlipidemia   . Hypertension   . Left humeral fracture Category 01/23/2012  . Nephrosclerosis    prn, Dr. Moshe Cipro  . Osteoarthritis   . Osteopenia   . Spinal stenosis    lumbar, bulging disc, receiving injections 01-01-2011,02-12-2011, Dr. Letta Pate  (pain management)  . Thyroid disease    Hypo    Medications:  Scheduled:  . ezetimibe-simvastatin  1 tablet Oral Q breakfast  . febuxostat  80 mg Oral Daily  . hydrocerin   Topical BID  . levothyroxine  112 mcg Oral QAC breakfast  . metoprolol succinate  25 mg Oral Daily  . predniSONE  5 mg Oral QAC breakfast  . sodium chloride flush  3 mL Intravenous Q12H  . torsemide  10 mg Oral QODAY  . torsemide  5 mg Oral QODAY   Infusions:  . sodium chloride 10 mL/hr (12/28/16 1152)  . heparin 1,000 Units/hr (12/28/16 1728)    Assessment:  83 yr female presents to ED with c/o shortness of breath.  PMH significant for HTN, HLD, CKD stage IV (Scr currently 2.07)  VQ scan is indeterminate for Pulmonary Embolism  Pharmacy consulted to begin IV heparin  12/29/2016 HL therapeutic at 1000 units/hr H/H WNL Scr 2.2, CrCl ~ 74mls/min No  bleeding or issues per RN  Goal of Therapy:  Heparin level 0.3-0.7 units/ml Monitor platelets by anticoagulation protocol: Yes   Plan:   Continue heparin drip at 1000 units/hr  Follow daily heparin level & CBC  Dolly Rias RPh 12/29/2016, 10:21 AM Pager [phone]

## 2016-12-29 NOTE — Progress Notes (Signed)
ANTICOAGULATION CONSULT NOTE - Follow Up Consult  Pharmacy Consult for Heparin Indication: pulmonary embolus  Allergies  Allergen Reactions  . Alendronate Sodium Other (See Comments)    Throat swelling  . Ketorolac Other (See Comments)    "Retained fluid in lungs"  . Ketorolac Tromethamine Other (See Comments)    FLUID RETENTION  . Mobic [Meloxicam]     Kidney decline  . Nsaids Other (See Comments)    Elevated kidney function  . Risedronate Sodium Other (See Comments)    Throat swelling and mouth swelling  . Septra [Sulfamethoxazole-Trimethoprim]     Rash  . Vioxx [Rofecoxib]     Kidney decline  . Allopurinol Rash  . Sulfa Antibiotics Rash    Patient Measurements: Height: 5' (152.4 cm) Weight: 168 lb 9.6 oz (76.5 kg) IBW/kg (Calculated) : 45.5 Heparin Dosing Weight:   Vital Signs: Temp: 98.6 F (37 C) (03/26 2051) Temp Source: Oral (03/26 2051) BP: 118/62 (03/26 2051) Pulse Rate: 85 (03/26 2051)  Labs:  Recent Labs  12/28/16 0947 12/28/16 1229 12/28/16 1911 12/29/16 0011  HGB 12.2  --   --   --   HCT 37.7  --   --   --   PLT 193  --   --   --   APTT  --   --  124*  --   LABPROT  --   --  15.1  --   INR  --   --  1.19  --   HEPARINUNFRC  --   --   --  0.41  CREATININE 2.07*  --   --   --   TROPONINI 0.13* 0.12* 0.10* 0.09*    Estimated Creatinine Clearance: 16.2 mL/min (A) (by C-G formula based on SCr of 2.07 mg/dL (H)).   Medications:  Infusions:  . sodium chloride 10 mL/hr (12/28/16 1152)  . heparin 1,000 Units/hr (12/28/16 1728)    Assessment: Patient with heparin level at goal.  No heparin issues noted.    Goal of Therapy:  Heparin level 0.3-0.7 units/ml Monitor platelets by anticoagulation protocol: Yes   Plan:  Continue heparin drip at current rate Recheck level at 0900  Tyler Deis, Shea Stakes [place] 12/29/2016,4:32 AM

## 2016-12-30 DIAGNOSIS — I10 Essential (primary) hypertension: Secondary | ICD-10-CM

## 2016-12-30 LAB — HEPARIN LEVEL (UNFRACTIONATED): HEPARIN UNFRACTIONATED: 0.56 [IU]/mL (ref 0.30–0.70)

## 2016-12-30 LAB — CBC
HEMATOCRIT: 34.8 % — AB (ref 36.0–46.0)
HEMOGLOBIN: 11.2 g/dL — AB (ref 12.0–15.0)
MCH: 30.1 pg (ref 26.0–34.0)
MCHC: 32.2 g/dL (ref 30.0–36.0)
MCV: 93.5 fL (ref 78.0–100.0)
Platelets: 205 10*3/uL (ref 150–400)
RBC: 3.72 MIL/uL — AB (ref 3.87–5.11)
RDW: 14.3 % (ref 11.5–15.5)
WBC: 7.2 10*3/uL (ref 4.0–10.5)

## 2016-12-30 LAB — BASIC METABOLIC PANEL
ANION GAP: 13 (ref 5–15)
BUN: 45 mg/dL — ABNORMAL HIGH (ref 6–20)
CALCIUM: 8.3 mg/dL — AB (ref 8.9–10.3)
CHLORIDE: 107 mmol/L (ref 101–111)
CO2: 22 mmol/L (ref 22–32)
CREATININE: 2.44 mg/dL — AB (ref 0.44–1.00)
GFR calc non Af Amer: 16 mL/min — ABNORMAL LOW (ref >60)
GFR, EST AFRICAN AMERICAN: 19 mL/min — AB (ref >60)
Glucose, Bld: 102 mg/dL — ABNORMAL HIGH (ref 65–99)
Potassium: 4 mmol/L (ref 3.5–5.1)
SODIUM: 142 mmol/L (ref 135–145)

## 2016-12-30 MED ORDER — APIXABAN 5 MG PO TABS
10.0000 mg | ORAL_TABLET | Freq: Two times a day (BID) | ORAL | Status: DC
  Administered 2016-12-30 – 2016-12-31 (×2): 10 mg via ORAL
  Filled 2016-12-30 (×3): qty 2

## 2016-12-30 NOTE — Progress Notes (Signed)
CSW informed patient's daughter about transportation options,patient's daughter reported that she wants to transport patient to SNF when discharged.   Abundio Miu, [place] Social Worker [facility] Cell#: [phone]

## 2016-12-30 NOTE — Progress Notes (Signed)
PROGRESS NOTE    Kari Martinez  EPP:[id number] DOB: 23-Oct-1923 DOA: 12/28/2016 PCP: Reginia Naas, MD   Brief Narrative:  Kari Martinez a 83 y.o.femalewith medical history significant of arthritis, prior gout, hypertension, hyperlipidemia, chronic kidney disease stage IV, presents to the emergency room with a chief complaint of shortness of breath and dyspnea on exertion. Patient over the last 3-4 days, noticed that every time she tries to get up and move around she becomes significantly short of breath, she is gasping for air and needs to stop any activity and rest. The shortness of breath usually gets better after 1 or 2 minutes of rest. Patient's daughter was in the room, and she tells me that patient has been less and less mobile over the last several months. She was just in the emergency room about 3 weeks ago with a fall, which has been happening at home. She denies any chest pains or palpitations. She denies any abdominal pain, nausea or vomiting. She denies any fever or chills. She denies any cough or chest congestion. She has had no sore throat or flulike illness or postnasal drip. Patient also tells me that in the last several weeks, she has noticed that her legs have become more swollen, left more than the right. She was placed on torsemide by her PCP, and she has been taking twice as much over the last week, and her swelling has come down. Admitted and found to have Diastolic Grade 1 CHF, Severe Pulmonary Hypertension, and suspected PE.  Assessment & Plan:   Active Problems:   Osteoarthritis of hip   Essential (primary) hypertension   Primary osteoarthritis of left knee   Dyspnea on exertion   CKD (chronic kidney disease), stage IV (HCC)   Pressure ulcer   Pulmonary emboli (HCC)  Dyspnea on Exertion/bilateral lower extremity edema in the setting of Diastolic Grade 1 CHF and suspected Pulmonary Embolus with Severe RV Dysfunction -Per family and Patient, she has  been on diuretic for a long time, but the edema is more pronounce a weeks ago, patient report sob, dyspnea seems also to be sudden onset a week ago. She denies chest pain, no cough, no syncope. -she has elevated ddimer of 8, intermediate VQ scan, echo with pulmonary arterial hypertension pa pressure 42mmhg, dilated RV with normal levf. -she has mild elevation of troponin, clear chest x ray. Venous doppler no DVT. Not able to get CTA due to CKD IV. - clinically can not rule out the possibility of PE, she is stared on heparin drip since admission. -Discussed with family regarding choice of oral anticoagulation for possible PE, the risk and benefit of coumadin/lovenox and apixaban discussed in detail. Patient and family elected to proceed with apixaban.  -They understand the plan is to continue heparin to finish total of 48hrs then transition to apixaban 10mg  bid for 7 days, then 5mg  bid for A few months. she need to have repeat renal function monthly, apixaban dose could be further reduced to 2.5 bid if renal function worsening, plan to treat total of 6 months if she can tolerate the treatment. Discussed Risk of possible hemorrhagic bleed if patient falls and hits her head and patient and family understand risk of decompensation and possible death and are agreeable to Anticoagulation -May consider formal Cardiology Consultation if not improved -Recommend repeat echocardiogram in three months to evaluate RV and pulmonary artery pressure.   Bilateral Lower Extremity Edema:  -from chronic lymphadema/venous insufficiency? Or from cor pulmonale and Grade 1  Diastolic CHF?  -Venous Ultrasound showed no DVT.   -Echo lvef 55-60%, elevated pulmonary artery pressure of 40mmhg, dilated RV with severely reduced systolic Function -She has mild elevation of her troponin/bnp, however has no anginal symptoms,  -elevated legs, -C/w Torsemide 10 mg EOD on 12/28/16 and with Torsemide 5 mg po EDO starting 12/29/16 -C/w  Metoprolol Succinate 25 mg po Daily  Chronic Kidney Disease Stage IV -Creatinine last couple years has been anywhere between 2 and 3.5, -Urinalysis concerning for UTI (many bacteria, WBC TNTC, large leukocyte esterase, negative nitrite), urine culture pending, she denies any urinary symptom, no fever, no leukocytosis, urine culture pending, Empirically started on Augmentin and will continue for 5 days -Avoid nephrotoxic agents -Monitor cr while on Diuretics -Patient's BUN/Cr this AM was 45/2.44 -Repeat CMP in AM  Hypertension -Resume home medications of Metoprolol  Pressure ulcers stage 2-3 on buttocks -Patient with limited mobility over the last several months -Wound Care Consult -PT/SNF placement  Chronic Gout/Arthritis -Patient has been on chronic prednisone, continue. Continue her home pain regimen with Uloric 80 mg po Daily  Hyperlipidemia -C/w Ezetimibe-Simvastatin 10-20 mg po Tablet  Hypothyroidism -Cw Levothyroxine 112 mcg po Daily  Left Left Cut -Continue to Monitor -On Empiric Abx with Augmentin  ?Fat Pad seen in Pericardial Space -Can Obtain Cardiac MRI as an outpatient to further assess -Discussed with Dr. Ellyn Hack in Cardiology over the phone and he states she does not need to have it done as an inpatient.   DVT prophylaxis: Heparin gtt and will transition to Eliquis 10 mg po Daily x 14 days then 5 mg BID  Code Status: DO NOT RESUSCITATE Family Communication: Discussed with Daughter and Son-in-Law at bedside Disposition Plan: SNF likely in AM  Consultants:   Called Cardiology Dr. Ellyn Hack to discuss ECHO results with him    Procedures: ECHOCARDIOGRAM Study Conclusions  - Left ventricle: The cavity size was normal. Wall thickness was   increased in a pattern of mild LVH. Systolic function was normal.   The estimated ejection fraction was in the range of 55% to 60%.   Wall motion was normal; there were no regional wall motion   abnormalities. Doppler  parameters are consistent with abnormal   left ventricular relaxation (grade 1 diastolic dysfunction). - Right ventricle: The cavity size was moderately dilated. Systolic   function was severely reduced. - Right atrium: The atrium was severely dilated. - Pulmonary arteries: Systolic pressure was severely increased. PA   peak pressure: 70 mm Hg (S). - Pericardium, extracardiac: A small pericardial effusion was   identified.  Impressions:  - Normal LV systolic function; grade 1 diastolic dysfunction;   RAE/RVE; severely reduced RV function; mild TR with severely   elevated pulmonary pressure; small pericardial effusion;   echodensity in pericardial space may represent prominent fat pad;   consider cardiac MRI to further assess.   Antimicrobials:  Anti-infectives    Start     Dose/Rate Route Frequency Ordered Stop   12/29/16 1915  amoxicillin-clavulanate (AUGMENTIN) 500-125 MG per tablet 500 mg     1 tablet Oral 2 times daily with meals 12/29/16 1905 01/03/17 1659     Subjective: Seen and examined at bedside and stated she was feeling better. No nausea or vomiting. States legs still swollen and still SOB on exertion. No CP.   Objective: Vitals:   12/29/16 0445 12/29/16 1231 12/29/16 2041 12/30/16 0400  BP: 131/89 120/68 118/66 (!) 144/73  Pulse: 77 74 81 71  Resp: 18 16  18 18  Temp: 97.7 F (36.5 C) 98.7 F (37.1 C) 97.6 F (36.4 C) 97.9 F (36.6 C)  TempSrc: Oral Oral Oral Oral  SpO2: 97% 98% 96% 95%  Weight:      Height:        Intake/Output Summary (Last 24 hours) at 12/30/16 0813 Last data filed at 12/29/16 2042  Gross per 24 hour  Intake              600 ml  Output              350 ml  Net              250 ml   Filed Weights   12/28/16 1545  Weight: 76.5 kg (168 lb 9.6 oz)   Examination: Physical Exam:  Constitutional: NAD and appears calm and comfortable Eyes: Lids and conjunctivae normal, sclerae anicteric  ENMT: External Ears, Nose appear normal.  Grossly normal hearing.  Neck: Appears normal, supple, no cervical masses, normal ROM, no appreciable thyromegaly, mild JVD Respiratory: Diminished to auscultation bilaterally, no wheezing, rales, rhonchi or crackles. Normal respiratory effort and patient is not tachypenic. No accessory muscle use.  Cardiovascular: RRR, no murmurs / rubs / gallops. S1 and S2 auscultated. 1+ lower extremity edema.  Abdomen: Soft, non-tender, non-distended. No masses palpated. No appreciable hepatosplenomegaly. Bowel sounds positive x4.  GU: Deferred. Musculoskeletal: No clubbing / cyanosis of digits/nails. No joint deformity upper and lower extremities.  Skin: Left Leg cut bandaged.  Neurologic: CN 2-12 grossly intact with no focal deficits. Romberg sign cerebellar reflexes not assessed.  Psychiatric: Normal judgment and insight. Alert and oriented x 2. Normal mood and appropriate affect.   Data Reviewed: I have personally reviewed following labs and imaging studies  CBC:  Recent Labs Lab 12/28/16 0947 12/29/16 0607 12/30/16 0537  WBC 7.7 6.8 7.2  NEUTROABS 5.4  --   --   HGB 12.2 12.2 11.2*  HCT 37.7 37.6 34.8*  MCV 92.0 93.8 93.5  PLT 193 205 027   Basic Metabolic Panel:  Recent Labs Lab 12/28/16 0947 12/29/16 0607 12/30/16 0537  NA 140 141 142  K 3.9 4.3 4.0  CL 110 109 107  CO2 21* 23 22  GLUCOSE 112* 117* 102*  BUN 34* 40* 45*  CREATININE 2.07* 2.24* 2.44*  CALCIUM 8.6* 8.7* 8.3*   GFR: Estimated Creatinine Clearance: 13.7 mL/min (A) (by C-G formula based on SCr of 2.44 mg/dL (H)). Liver Function Tests:  Recent Labs Lab 12/29/16 0607  AST 23  ALT 14  ALKPHOS 131*  BILITOT 0.9  PROT 5.5*  ALBUMIN 2.8*   No results for input(s): LIPASE, AMYLASE in the last 168 hours. No results for input(s): AMMONIA in the last 168 hours. Coagulation Profile:  Recent Labs Lab 12/28/16 1911  INR 1.19   Cardiac Enzymes:  Recent Labs Lab 12/28/16 0947 12/28/16 1229 12/28/16 1911  12/29/16 0011  TROPONINI 0.13* 0.12* 0.10* 0.09*   BNP (last 3 results) No results for input(s): PROBNP in the last 8760 hours. HbA1C: No results for input(s): HGBA1C in the last 72 hours. CBG: No results for input(s): GLUCAP in the last 168 hours. Lipid Profile: No results for input(s): CHOL, HDL, LDLCALC, TRIG, CHOLHDL, LDLDIRECT in the last 72 hours. Thyroid Function Tests: No results for input(s): TSH, T4TOTAL, FREET4, T3FREE, THYROIDAB in the last 72 hours. Anemia Panel: No results for input(s): VITAMINB12, FOLATE, FERRITIN, TIBC, IRON, RETICCTPCT in the last 72 hours. Sepsis Labs: No results for  input(s): PROCALCITON, LATICACIDVEN in the last 168 hours.  No results found for this or any previous visit (from the past 240 hour(s)).   Radiology Studies: Dg Chest 2 View  Result Date: 12/28/2016 CLINICAL DATA:  Worsening shortness of breath over this past several days EXAM: CHEST  2 VIEW COMPARISON:  03/26/2015 FINDINGS: Cardiac shadow is stable. The lungs are well aerated bilaterally. No focal infiltrate or sizable effusion is seen. No bony abnormality is noted. IMPRESSION: No active cardiopulmonary disease. Electronically Signed   By: Inez Catalina M.D.   On: 12/28/2016 09:39   Nm Pulmonary Perf And Vent  Result Date: 12/28/2016 CLINICAL DATA:  Shortness of breath. EXAM: NUCLEAR MEDICINE VENTILATION - PERFUSION LUNG SCAN TECHNIQUE: Ventilation images were obtained in multiple projections using inhaled aerosol Tc-30m DTPA. Perfusion images were obtained in multiple projections after intravenous injection of Tc-73m MAA. RADIOPHARMACEUTICALS:  30.0 mCi Technetium-51m DTPA aerosol inhalation and 4.2 mCi Technetium-4m MAA IV COMPARISON:  Chest x-ray 12/28/2016 . FINDINGS: Patchy bilateral correlation perfusion matched defects are present. This is an indeterminate scan for pulmonary embolus. IMPRESSION: Indeterminate scan for pulmonary embolus. Electronically Signed   By: Marcello Moores  Register    On: 12/28/2016 15:00   Scheduled Meds: . amoxicillin-clavulanate  1 tablet Oral BID WC  . ezetimibe-simvastatin  1 tablet Oral Q breakfast  . febuxostat  80 mg Oral Daily  . hydrocerin   Topical BID  . levothyroxine  112 mcg Oral QAC breakfast  . metoprolol succinate  25 mg Oral Daily  . predniSONE  5 mg Oral QAC breakfast  . sodium chloride flush  3 mL Intravenous Q12H  . torsemide  10 mg Oral QODAY  . torsemide  5 mg Oral QODAY   Continuous Infusions: . sodium chloride 10 mL/hr at 12/29/16 1803  . heparin 1,000 Units/hr (12/29/16 1803)    LOS: 1 day   Kerney Elbe, DO [facility] Pager [phone]  If 7PM-7AM, please contact night-coverage [email or web address] Password [facility] 12/30/2016, 8:13 AM

## 2016-12-30 NOTE — Progress Notes (Signed)
Pt refused to wear Prevalon boots. Said she "could not stand them."

## 2016-12-30 NOTE — Progress Notes (Signed)
[place] for Heparin/ eliquis Indication: pulmonary embolus  Allergies  Allergen Reactions  . Alendronate Sodium Other (See Comments)    Throat swelling  . Ketorolac Other (See Comments)    "Retained fluid in lungs"  . Ketorolac Tromethamine Other (See Comments)    FLUID RETENTION  . Mobic [Meloxicam]     Kidney decline  . Nsaids Other (See Comments)    Elevated kidney function  . Risedronate Sodium Other (See Comments)    Throat swelling and mouth swelling  . Septra [Sulfamethoxazole-Trimethoprim]     Rash  . Vioxx [Rofecoxib]     Kidney decline  . Allopurinol Rash  . Sulfa Antibiotics Rash    Patient Measurements: Height: 5' (152.4 cm) Weight: 168 lb 9.6 oz (76.5 kg) IBW/kg (Calculated) : 45.5 Heparin Dosing Weight: 55 kg  Vital Signs: Temp: 97.9 F (36.6 C) (03/28 0400) Temp Source: Oral (03/28 0400) BP: 144/73 (03/28 0400) Pulse Rate: 71 (03/28 0400)  Labs:  Recent Labs  12/28/16 0947 12/28/16 1229 12/28/16 1911 12/29/16 0011 12/29/16 0607 12/29/16 0809 12/30/16 0537  HGB 12.2  --   --   --  12.2  --  11.2*  HCT 37.7  --   --   --  37.6  --  34.8*  PLT 193  --   --   --  205  --  205  APTT  --   --  124*  --   --   --   --   LABPROT  --   --  15.1  --   --   --   --   INR  --   --  1.19  --   --   --   --   HEPARINUNFRC  --   --   --  0.41  --  0.51 0.56  CREATININE 2.07*  --   --   --  2.24*  --  2.44*  TROPONINI 0.13* 0.12* 0.10* 0.09*  --   --   --     Estimated Creatinine Clearance: 13.7 mL/min (A) (by C-G formula based on SCr of 2.44 mg/dL (H)).   Medical History: Past Medical History:  Diagnosis Date  . Arthritis   . Chronic renal insufficiency, stage III (moderate)   . Gout    Dr. Ouida Sills Rheumatologist  . Hyperlipidemia   . Hypertension   . Left humeral fracture Category 01/23/2012  . Nephrosclerosis    prn, Dr. Moshe Cipro  . Osteoarthritis   . Osteopenia   . Spinal stenosis    lumbar, bulging disc, receiving injections 01-01-2011,02-12-2011, Dr. Letta Pate (pain management)  . Thyroid disease    Hypo    Medications:  Scheduled:  . amoxicillin-clavulanate  1 tablet Oral BID WC  . ezetimibe-simvastatin  1 tablet Oral Q breakfast  . febuxostat  80 mg Oral Daily  . hydrocerin   Topical BID  . levothyroxine  112 mcg Oral QAC breakfast  . metoprolol succinate  25 mg Oral Daily  . predniSONE  5 mg Oral QAC breakfast  . sodium chloride flush  3 mL Intravenous Q12H  . torsemide  10 mg Oral QODAY  . torsemide  5 mg Oral QODAY   Infusions:  . sodium chloride 10 mL/hr at 12/29/16 1803  . heparin 1,000 Units/hr (12/29/16 1803)    Assessment:  83 yr female presents to ED with c/o shortness of breath.  PMH significant for HTN, HLD, CKD stage IV (Scr currently 2.07)  VQ scan is indeterminate for Pulmonary Embolism  Pharmacy consulted for IV heparin  Pharmacy consulted to transition to eliquis  12/30/2016 HL therapeutic at 1000 units/hr H/H slightly low but stable Scr 2.4, CrCl ~ 42mls/min No bleeding or issues per RN  Goal of Therapy:  Heparin level 0.3-0.7 units/ml Monitor platelets by anticoagulation protocol: Yes eliquis per indication  Plan:   Continue heparin drip at 1000 units/hr today and stop tonight at 1800  At 1800 Start eliquis 10mg  po twice daily x 7days then 5mg  twice daily thereafter  MD discussed at length with patient who does not want to take warfarin, however is willing to accept the risk of taking Eliquis with poor renal function.  Dolly Rias RPh 12/30/2016, 11:34 AM Pager [phone]

## 2016-12-30 NOTE — Progress Notes (Signed)
Patient received bed offer at [facility], patient's daughter notified and accepted bed offer. Patient's daughter inquired about options for transportation to SNF in [place], [place], [place] will follow up with supervisor to explore any available options.   CSW received authorization info.   Authorization [id number] for admit date 12/31/2016 Next Review due 01/01/2017 [place] [facility] Contact Person Angie - [phone] ext.[place], [place] Worker [facility] Cell#: [phone]

## 2016-12-30 NOTE — NC FL2 (Signed)
[place] MEDICAID FL2 LEVEL OF CARE SCREENING TOOL     IDENTIFICATION  Patient Name: Kari Martinez Birthdate: 1924-05-21 Sex: female Admission Date (Current Location): 12/28/2016  [facility] and [place] Number:  Herbalist and Address:  [facility],  [place], [place]      Provider Number: [id number]  Attending Physician Name and Address:  Kerney Elbe, DO  Relative Name and Phone Number:       Current Level of Care: Hospital Recommended Level of Care: [place] Prior Approval Number:    Date Approved/Denied:   PASRR Number: [id number]  Discharge Plan: SNF    Current Diagnoses: Patient Active Problem List   Diagnosis Date Noted  . Pulmonary emboli ([place]) 12/29/2016  . Dyspnea on exertion 12/28/2016  . CKD (chronic kidney disease), stage IV ([place]) 12/28/2016  . Pressure ulcer 12/28/2016  . Primary osteoarthritis of left knee 10/13/2016  . Urinary tract infection 03/26/2015  . ARF (acute renal failure) ([place]) 03/26/2015  . Elevated LFTs 03/26/2015  . Essential (primary) hypertension 03/26/2015  . Osteoarthritis of hip 08/14/2013  . Chronic low back pain 04/24/2013  . Acquired scoliosis 03/28/2013  . Back pain 01/23/2013  . Right lumbar radiculitis 01/23/2013  . Spinal stenosis of lumbar region 01/23/2013  . Lumbar spondylosis 01/12/2012    Orientation RESPIRATION BLADDER Height & Weight     Self, Time, Situation, Place  Normal Continent Weight: 168 lb 9.6 oz (76.5 kg) Height:  5' (152.4 cm)  BEHAVIORAL SYMPTOMS/MOOD NEUROLOGICAL BOWEL NUTRITION STATUS  Other (Comment) (no behaviors)   Continent Diet  AMBULATORY STATUS COMMUNICATION OF NEEDS Skin   Extensive Assist Verbally Other (Comment) (laceration on left lower leg)                       Personal Care Assistance Level of Assistance  Bathing, Feeding, Dressing Bathing Assistance: Limited assistance Feeding assistance: Independent Dressing  Assistance: Limited assistance     Functional Limitations Info  Sight, Hearing, Speech Sight Info: Adequate Hearing Info: Impaired Speech Info: Adequate    SPECIAL CARE FACTORS FREQUENCY  PT (By licensed PT)     PT Frequency: 5x wk OT Frequency: 5x wk            Contractures Contractures Info: Not present    Additional Factors Info  Code Status, Allergies Code Status Info: DNR Allergies Info: Alendronate Sodium;Ketorolac;Ketorolac Tromethamine;Mobic Meloxicam;Nsaids;Risedronate Sodium;Vioxx Rofecoxib;Allopurinol;Sulfa Antibiotics;Septra Sulfamethoxazole-trimethoprim;           Current Medications (12/30/2016):  This is the current hospital active medication list Current Facility-Administered Medications  Medication Dose Route Frequency Provider Last Rate Last Dose  . 0.9 %  sodium chloride infusion   Intravenous Continuous Lacretia Leigh, MD 10 mL/hr at 12/29/16 1803    . amoxicillin-clavulanate (AUGMENTIN) 500-125 MG per tablet 500 mg  1 tablet Oral BID WC Florencia Reasons, MD   500 mg at 12/29/16 2039  . diclofenac sodium (VOLTAREN) 1 % transdermal gel 2 g  2 g Topical QID PRN Costin Karlyne Greenspan, MD      . ezetimibe-simvastatin (VYTORIN) 10-20 MG per tablet 1 tablet  1 tablet Oral Q breakfast Costin Karlyne Greenspan, MD   1 tablet at 12/29/16 0750  . febuxostat (ULORIC) tablet 80 mg  80 mg Oral Daily Caren Griffins, MD   80 mg at 12/29/16 0908  . gabapentin (NEURONTIN) capsule 200 mg  200 mg Oral TID PRN Costin Karlyne Greenspan, MD      .  heparin ADULT infusion 100 units/mL (25000 units/259mL sodium chloride 0.45%)  1,000 Units/hr Intravenous Continuous Leann T Poindexter, RPH 10 mL/hr at 12/29/16 1803 1,000 Units/hr at 12/29/16 1803  . hydrocerin (EUCERIN) cream   Topical BID Costin Karlyne Greenspan, MD      . levothyroxine (SYNTHROID, LEVOTHROID) tablet 112 mcg  112 mcg Oral QAC breakfast Caren Griffins, MD   112 mcg at 12/29/16 0750  . metoprolol succinate (TOPROL-XL) 24 hr tablet 25 mg  25 mg Oral  Daily Caren Griffins, MD   25 mg at 12/29/16 0903  . oxyCODONE (Oxy IR/ROXICODONE) immediate release tablet 15 mg  15 mg Oral Q8H PRN Costin Karlyne Greenspan, MD      . predniSONE (DELTASONE) tablet 5 mg  5 mg Oral QAC breakfast Costin Karlyne Greenspan, MD   5 mg at 12/29/16 0750  . sodium chloride flush (NS) 0.9 % injection 3 mL  3 mL Intravenous Q12H Caren Griffins, MD   3 mL at 12/29/16 2039  . technetium albumin aggregated (MAA) injection solution 4.2 millicurie  4.2 millicurie Intravenous Once PRN Rolm Baptise, MD      . technetium TC 81M diethylenetriame-pentaacetic acid (DTPA) injection 30 millicurie  30 millicurie Intravenous Once PRN Rolm Baptise, MD      . torsemide [facility]) tablet 10 mg  10 mg Oral QODAY Caren Griffins, MD   10 mg at 12/28/16 1710  . torsemide (DEMADEX) tablet 5 mg  5 mg Oral QODAY Caren Griffins, MD   5 mg at 12/29/16 0903  . traMADol (ULTRAM) tablet 50 mg  50 mg Oral Q6H PRN Costin Karlyne Greenspan, MD         Discharge Medications: Please see discharge summary for a list of discharge medications.  Relevant Imaging Results:  Relevant Lab Results:   Additional Information SS # [id number]  Sheyanne Munley, Randall An, LCSW

## 2016-12-30 NOTE — Progress Notes (Signed)
   12/30/16 1100  Clinical Encounter Type  Visited With Patient and family together  Visit Type Initial;Psychological support;Spiritual support;Other (Comment) (Advance Directive Completion )  Referral From Nurse  Consult/Referral To Chaplain  Spiritual Encounters  Spiritual Needs Other (Comment) (Advance Directive Completion )  Stress Factors  Patient Stress Factors Other (Comment) (Advance Directive )  Family Stress Factors Other (Comment) (Advance Directive )  Advance Directives (For Healthcare)  Does Patient Have a Medical Advance Directive? Yes  Type of Paramedic of [place];Living will  Copy of [place] in Chart? Yes  Copy of Living Will in Chart? Yes   I spoke with the patient and her daughter about the Advance Directive and the patient understood the document.  2 witnessed were procured and the Advance Directive was notarized.  Copies were made and one was placed in the patient's chart.    [place] M.Div.

## 2016-12-30 NOTE — Discharge Instructions (Signed)
Information on my medicine - ELIQUIS (apixaban)  This medication education was reviewed with me or my healthcare representative as part of my discharge preparation.  The pharmacist that spoke with me during my hospital stay was:  Rhona Leavens. Why was Eliquis prescribed for you? Eliquis was prescribed to treat blood clots that may have been found in the veins of your legs (deep vein thrombosis) or in your lungs (pulmonary embolism) and to reduce the risk of them occurring again.  What do You need to know about Eliquis ? The starting dose is 10 mg (two 5 mg tablets) taken TWICE daily for the FIRST SEVEN (7) DAYS, then on (enter date)  April 4, 18  the dose is reduced to ONE 5 mg tablet taken TWICE daily.  Eliquis may be taken with or without food.   Try to take the dose about the same time in the morning and in the evening. If you have difficulty swallowing the tablet whole please discuss with your pharmacist how to take the medication safely.  Take Eliquis exactly as prescribed and DO NOT stop taking Eliquis without talking to the doctor who prescribed the medication.  Stopping may increase your risk of developing a new blood clot.  Refill your prescription before you run out.  After discharge, you should have regular check-up appointments with your healthcare provider that is prescribing your Eliquis.    What do you do if you miss a dose? If a dose of ELIQUIS is not taken at the scheduled time, take it as soon as possible on the same day and twice-daily administration should be resumed. The dose should not be doubled to make up for a missed dose.  Important Safety Information A possible side effect of Eliquis is bleeding. You should call your healthcare provider right away if you experience any of the following: ? Bleeding from an injury or your nose that does not stop. ? Unusual colored urine (red or dark brown) or unusual colored stools (red or black). ? Unusual bruising for unknown  reasons. ? A serious fall or if you hit your head (even if there is no bleeding).  Some medicines may interact with Eliquis and might increase your risk of bleeding or clotting while on Eliquis. To help avoid this, consult your healthcare provider or pharmacist prior to using any new prescription or non-prescription medications, including herbals, vitamins, non-steroidal anti-inflammatory drugs (NSAIDs) and supplements.  This website has more information on Eliquis (apixaban): http://www.eliquis.com/eliquis/home

## 2016-12-30 NOTE — Progress Notes (Signed)
This RN received report from previous RN and agrees with all assessments. Will continue to monitor pt. Closely

## 2016-12-31 DIAGNOSIS — I2699 Other pulmonary embolism without acute cor pulmonale: Secondary | ICD-10-CM | POA: Diagnosis not present

## 2016-12-31 DIAGNOSIS — E782 Mixed hyperlipidemia: Secondary | ICD-10-CM | POA: Diagnosis not present

## 2016-12-31 DIAGNOSIS — I5030 Unspecified diastolic (congestive) heart failure: Secondary | ICD-10-CM | POA: Diagnosis not present

## 2016-12-31 DIAGNOSIS — M6281 Muscle weakness (generalized): Secondary | ICD-10-CM | POA: Diagnosis not present

## 2016-12-31 DIAGNOSIS — R0600 Dyspnea, unspecified: Secondary | ICD-10-CM | POA: Diagnosis not present

## 2016-12-31 DIAGNOSIS — M1612 Unilateral primary osteoarthritis, left hip: Secondary | ICD-10-CM | POA: Diagnosis not present

## 2016-12-31 DIAGNOSIS — Z111 Encounter for screening for respiratory tuberculosis: Secondary | ICD-10-CM | POA: Diagnosis not present

## 2016-12-31 DIAGNOSIS — I272 Pulmonary hypertension, unspecified: Secondary | ICD-10-CM | POA: Diagnosis not present

## 2016-12-31 DIAGNOSIS — R0609 Other forms of dyspnea: Secondary | ICD-10-CM | POA: Diagnosis not present

## 2016-12-31 DIAGNOSIS — I2609 Other pulmonary embolism with acute cor pulmonale: Secondary | ICD-10-CM | POA: Diagnosis not present

## 2016-12-31 DIAGNOSIS — M1990 Unspecified osteoarthritis, unspecified site: Secondary | ICD-10-CM | POA: Diagnosis not present

## 2016-12-31 DIAGNOSIS — I5032 Chronic diastolic (congestive) heart failure: Secondary | ICD-10-CM | POA: Diagnosis not present

## 2016-12-31 DIAGNOSIS — I10 Essential (primary) hypertension: Secondary | ICD-10-CM | POA: Diagnosis not present

## 2016-12-31 DIAGNOSIS — N184 Chronic kidney disease, stage 4 (severe): Secondary | ICD-10-CM | POA: Diagnosis not present

## 2016-12-31 DIAGNOSIS — E785 Hyperlipidemia, unspecified: Secondary | ICD-10-CM | POA: Diagnosis not present

## 2016-12-31 DIAGNOSIS — N390 Urinary tract infection, site not specified: Secondary | ICD-10-CM | POA: Diagnosis not present

## 2016-12-31 DIAGNOSIS — M109 Gout, unspecified: Secondary | ICD-10-CM | POA: Diagnosis not present

## 2016-12-31 LAB — CBC WITH DIFFERENTIAL/PLATELET
BASOS ABS: 0 10*3/uL (ref 0.0–0.1)
Basophils Relative: 0 %
EOS PCT: 1 %
Eosinophils Absolute: 0.1 10*3/uL (ref 0.0–0.7)
HCT: 37.6 % (ref 36.0–46.0)
Hemoglobin: 12.1 g/dL (ref 12.0–15.0)
LYMPHS PCT: 22 %
Lymphs Abs: 1.8 10*3/uL (ref 0.7–4.0)
MCH: 30.2 pg (ref 26.0–34.0)
MCHC: 32.2 g/dL (ref 30.0–36.0)
MCV: 93.8 fL (ref 78.0–100.0)
MONO ABS: 0.7 10*3/uL (ref 0.1–1.0)
Monocytes Relative: 9 %
Neutro Abs: 5.4 10*3/uL (ref 1.7–7.7)
Neutrophils Relative %: 68 %
PLATELETS: 225 10*3/uL (ref 150–400)
RBC: 4.01 MIL/uL (ref 3.87–5.11)
RDW: 14.4 % (ref 11.5–15.5)
WBC: 8 10*3/uL (ref 4.0–10.5)

## 2016-12-31 LAB — COMPREHENSIVE METABOLIC PANEL
ALBUMIN: 3.4 g/dL — AB (ref 3.5–5.0)
ALT: 15 U/L (ref 14–54)
ANION GAP: 10 (ref 5–15)
AST: 26 U/L (ref 15–41)
Alkaline Phosphatase: 114 U/L (ref 38–126)
BILIRUBIN TOTAL: 0.9 mg/dL (ref 0.3–1.2)
BUN: 42 mg/dL — ABNORMAL HIGH (ref 6–20)
CHLORIDE: 111 mmol/L (ref 101–111)
CO2: 24 mmol/L (ref 22–32)
Calcium: 8.8 mg/dL — ABNORMAL LOW (ref 8.9–10.3)
Creatinine, Ser: 2.04 mg/dL — ABNORMAL HIGH (ref 0.44–1.00)
GFR calc Af Amer: 23 mL/min — ABNORMAL LOW (ref >60)
GFR calc non Af Amer: 20 mL/min — ABNORMAL LOW (ref >60)
GLUCOSE: 89 mg/dL (ref 65–99)
POTASSIUM: 3.6 mmol/L (ref 3.5–5.1)
SODIUM: 145 mmol/L (ref 135–145)
TOTAL PROTEIN: 6.5 g/dL (ref 6.5–8.1)

## 2016-12-31 LAB — PHOSPHORUS: Phosphorus: 4 mg/dL (ref 2.5–4.6)

## 2016-12-31 LAB — MAGNESIUM: Magnesium: 1.9 mg/dL (ref 1.7–2.4)

## 2016-12-31 MED ORDER — FEBUXOSTAT 40 MG PO TABS: 40.0000 mg | ORAL_TABLET | Freq: Every day | ORAL | 0 refills | Status: AC

## 2016-12-31 MED ORDER — APIXABAN 5 MG PO TABS: 5.0000 mg | ORAL_TABLET | Freq: Two times a day (BID) | ORAL | 0 refills | Status: DC

## 2016-12-31 MED ORDER — APIXABAN 5 MG PO TABS: 10.0000 mg | ORAL_TABLET | Freq: Two times a day (BID) | ORAL | 0 refills | Status: AC

## 2016-12-31 MED ORDER — TRAMADOL HCL 50 MG PO TABS: 50.0000 mg | ORAL_TABLET | Freq: Four times a day (QID) | ORAL | 0 refills | Status: AC | PRN

## 2016-12-31 MED ORDER — FUROSEMIDE 10 MG/ML IJ SOLN
20.0000 mg | Freq: Once | INTRAMUSCULAR | Status: AC
  Administered 2016-12-31: 20 mg via INTRAVENOUS
  Filled 2016-12-31: qty 2

## 2016-12-31 MED ORDER — FEBUXOSTAT 40 MG PO TABS
40.0000 mg | ORAL_TABLET | Freq: Every day | ORAL | Status: DC
  Administered 2016-12-31: 40 mg via ORAL
  Filled 2016-12-31: qty 1

## 2016-12-31 MED ORDER — AMOXICILLIN-POT CLAVULANATE 500-125 MG PO TABS: 1.0000 | ORAL_TABLET | Freq: Two times a day (BID) | ORAL | 0 refills | Status: AC

## 2016-12-31 MED ORDER — OXYCODONE HCL 15 MG PO TABS: 15.0000 mg | ORAL_TABLET | Freq: Three times a day (TID) | ORAL | 0 refills | Status: AC | PRN

## 2016-12-31 MED ORDER — TORSEMIDE 10 MG PO TABS
10.0000 mg | ORAL_TABLET | Freq: Every day | ORAL | Status: DC
  Administered 2016-12-31: 10 mg via ORAL
  Filled 2016-12-31: qty 1

## 2016-12-31 MED ORDER — APIXABAN 5 MG PO TABS: 5.0000 mg | ORAL_TABLET | Freq: Two times a day (BID) | ORAL | 0 refills | Status: AC

## 2016-12-31 MED ORDER — TORSEMIDE 10 MG PO TABS: 10.0000 mg | ORAL_TABLET | Freq: Every day | ORAL | 0 refills | Status: AC

## 2016-12-31 MED ORDER — HYDROCERIN EX CREA: 1.0000 "application " | TOPICAL_CREAM | Freq: Two times a day (BID) | CUTANEOUS | 0 refills | Status: AC

## 2016-12-31 NOTE — Evaluation (Signed)
Occupational Therapy Evaluation Patient Details Name: Kari Martinez MRN: [id number] DOB: 09-25-1924 Today's Date: 12/31/2016    History of Present Illness Kari Martinez is a 83 y.o. female with medical history significant of arthritis, prior gout, hypertension, hyperlipidemia, chronic kidney disease stage IV, presents to the emergency room with a chief complaint of shortness of breath and dyspnea on exertion.  VQ scan Patchy bilateral correlation perfusion matched defects are present  and indeterminant for PE.   Clinical Impression   Pt admitted as above currently presenting with generalized weakness, deconditioning and fluctuating O2 SATs (84-92) on room air, with little activity related to ADL's and functional mobility (see OT problem list below). Pt should benefit from acute OT followed by SNF Rehab as she lives alone.    Follow Up Recommendations  SNF;Supervision/Assistance - 24 hour    Equipment Recommendations  Other (comment) (Defer to next venue; should benefit from 3:1)    Recommendations for Other Services       Precautions / Restrictions Precautions Precautions: Fall Precaution Comments: monitor Sats and HR, not on O2, on heparin Restrictions Weight Bearing Restrictions: No      Mobility Bed Mobility Overal bed mobility:  (Sitting EOB upon OT arrival)                Transfers Overall transfer level: Needs assistance Equipment used: Rolling walker (2 wheeled) Transfers: Sit to/from [facility] Sit to Stand: Min assist (VC's for safety and sequencing) Stand pivot transfers: Min assist (VC's for safety and sequencing)       General transfer comment: Assist to rise from bed and vc's for safety to push up from bed and reach for armrests on chair (pt tends to want to hold onto RW). O2 SATs fluctuate with very little activity related to transfers    Balance Overall balance assessment: Needs assistance Sitting-balance support: No upper extremity  supported;Feet supported Sitting balance-Leahy Scale: Good     Standing balance support: Bilateral upper extremity supported;During functional activity Standing balance-Leahy Scale: Fair Standing balance comment: O2 SATs fluctuate (84-92) and are limiting factor as well as decreased endurance/activity tolerance                           ADL either performed or assessed with clinical judgement   ADL Overall ADL's : Needs assistance/impaired Eating/Feeding: Set up;Sitting   Grooming: Set up;Sitting   Upper Body Bathing: Set up;Minimal assistance;Sitting (Secondary to SOB)   Lower Body Bathing: Minimal assistance;Sit to/from stand   Upper Body Dressing : Set up;Sitting;Min guard   Lower Body Dressing: Minimal assistance;Sit to/from stand   Toilet Transfer: Min guard;Ambulation;RW (Simulated toilet transfer today EOB amb to recliner) Toilet Transfer Details (indicate cue type and reason): Recommend 3:1 over toilet as pt reports difficulty getting up off of commode at home (currently grabbing onto sink and "It's really hard") Toileting- Clothing Manipulation and Hygiene: Sit to/from stand;Min guard     Tub/Shower Transfer Details (indicate cue type and reason): Has walk in shower at home with [facility] shower head and 2 grab bars. NT this date secondary to deconditioning and SOB Functional mobility during ADLs: Min guard;Minimal assistance;Cueing for safety;Rolling walker (Min A initial sit to stand & then Min guard) General ADL Comments: Pt was educated in role of OT, discussed DME (recommend 3:1 at home) and possible a/e as well as EC techniques; assessment was performed. During functional mobility, pt O2 SATs fluctuated from 84-92 within seconds. Pt  overall deconditioned/generalized weakness. She should benefit from SNF Rehab for OT after acute stay.     Vision Baseline Vision/History: Wears glasses Wears Glasses: At all times Patient Visual Report: No change from baseline        Perception     Praxis      Pertinent Vitals/Pain Pain Assessment: No/denies pain Pain Score: 0-No pain Faces Pain Scale: No hurt     Hand Dominance Right   Extremity/Trunk Assessment Upper Extremity Assessment Upper Extremity Assessment: Generalized weakness   Lower Extremity Assessment Lower Extremity Assessment: Generalized weakness;Defer to PT evaluation       Communication Communication Communication: No difficulties   Cognition Arousal/Alertness: Awake/alert Behavior During Therapy: WFL for tasks assessed/performed Overall Cognitive Status: Within Functional Limits for tasks assessed                                     General Comments       Exercises     Shoulder Instructions      Home Living Family/patient expects to be discharged to:: Skilled nursing facility Living Arrangements: Alone   Type of Home: Apartment Home Access: [place]: One level               Home Equipment: [place] - 4 wheels;Shower seat - built in;Grab bars - tub/shower;Hand held shower head          Prior Functioning/Environment Level of Independence: Independent with assistive device(s)        Comments: family assisted with groceries; Has someone clean the house 1x/month        OT Problem List: Decreased strength;Impaired balance (sitting and/or standing);Decreased knowledge of precautions;Obesity;Cardiopulmonary status limiting activity;Decreased activity tolerance;Decreased knowledge of use of DME or AE      OT Treatment/Interventions: Self-care/ADL training;DME and/or AE instruction;Therapeutic activities;Therapeutic exercise;Energy conservation;Patient/family education (Balance will not be formally addressed except in context of ADL's/self care tasks)    OT Goals(Current goals can be found in the care plan section) Acute Rehab OT Goals Patient Stated Goal: Go to rehab near daughter. OT Goal Formulation: With patient Time For  Goal Achievement: 01/14/17 Potential to Achieve Goals: Good  OT Frequency: Min 2X/week   Barriers to D/C: Other (comment)  Lives alone       Co-evaluation              End of Session Equipment Utilized During Treatment: Gait belt;Rolling walker Nurse Communication: Mobility status  Activity Tolerance: Patient limited by fatigue;Patient tolerated treatment well Patient left: in chair;with call bell/phone within reach;with chair alarm set  OT Visit Diagnosis: Unsteadiness on feet (R26.81);Other abnormalities of gait and mobility (R26.89);Muscle weakness (generalized) (M62.81)                Time: 7408-1448 OT Time Calculation (min): 35 min Charges:  OT General Charges $OT Visit: 1 Procedure OT Evaluation $OT Eval Low Complexity: 1 Procedure OT Treatments $Self Care/Home Management : 8-22 mins G-Codes: OT G-codes **NOT FOR INPATIENT CLASS** Functional Assessment Tool Used: AM-PAC 6 Clicks Daily Activity Functional Limitation: Self care Self Care Current Status (J8563): At least 40 percent but less than 60 percent impaired, limited or restricted Self Care Goal Status (J4970): At least 20 percent but less than 40 percent impaired, limited or restricted    Almyra Deforest, OTR/L 12/31/2016, 8:53 AM

## 2016-12-31 NOTE — Progress Notes (Signed)
Discharged with daughter to [facility] in [place], [place] via personal car. Discharge papers given to daughter for facility. Report called to Dorris Carnes, RN. Condition unchanged. Eulas Post, RN

## 2016-12-31 NOTE — Progress Notes (Signed)
Pt discharged to SNF. Pt's daughter transported her. CSW followed.

## 2016-12-31 NOTE — Discharge Summary (Addendum)
Physician Discharge Summary  Kari Martinez PPJ:[id number] DOB: 1923/12/06 DOA: 12/28/2016  PCP: Reginia Naas, MD  Admit date: 12/28/2016 Discharge date: 12/31/2016  Admitted From: Home Disposition:  SNF  Recommendations for Outpatient Follow-up:  1. Follow up with PCP in 1-2 weeks 2. Follow up with Cardiology as an outpatient to discuss Heart Failure and possible Cardiac MRI for evaluation of suspected fat pad 3. Repeat ECHOcardiogram in 3 months to evaluate RV and Pulmonary Artery Pressure 4. Take Apixaban 10 mg po BID x 7 days total and then start 5 mg po BID at the end of the 7 days and follow up with Cardiology as an outpatient 5. Please obtain CMP/CBC in one week  Home Health: No Equipment/Devices: None but should benefit from 3 in 1 Beside Commode   Discharge Condition: Stable CODE STATUS: DNR Diet recommendation: Heart Healthy Low Sodium Diet  Brief/Interim Summary: Kari Martinez a 83 y.o.femalewith medical history significant of arthritis, prior gout, hypertension, hyperlipidemia, chronic kidney disease stage IV, presented to the emergency room with a chief complaint of shortness of breath and dyspnea on exertion. Patient over the last 3-4 days, noticed that every time she tries to get up and move around she becomes significantly short of breath, she is gasping for air and needs to stop any activity and rest. The shortness of breath usually gets better after 1 or 2 minutes of rest. Patient's daughter was in the room, and she tells me that patient has been less and less mobile over the last several months. She was just in the emergency room about 3 weeks ago with a fall, which has been happening at home. She denies any chest pains or palpitations. She denies any abdominal pain, nausea or vomiting. She denies any fever or chills. She denies any cough or chest congestion. She has had no sore throat or flulike illness or postnasal drip. Patient also tells me that in the  last several weeks, she has noticed that her legs have become more swollen, left more than the right. She was placed on torsemide by her PCP, and she has been taking twice as much over the last week, and her swelling has come down. Admitted and found to have Diastolic Grade 1 CHF, Severe Pulmonary Hypertension, and suspected PE. After lengthy discussion with the family she was placed on Anticoagulation as PE could not be ruled out and transitioned to Eliquis. Patient was also diuresed and improved. Discussed with Cardiology about ECHO findings and will need to have a Cardiology outpatient follow up near SNF at discharged. Was deemed medically stable at this time to be D/C'd to SNF.   Discharge Diagnoses:  Active Problems:   Osteoarthritis of hip   Essential (primary) hypertension   Primary osteoarthritis of left knee   Dyspnea on exertion   CKD (chronic kidney disease), stage IV (HCC)   Pressure ulcer   Pulmonary emboli (HCC)  Dyspnea on Exertion/bilateral lower extremity edema in the setting of Diastolic Grade 1 CHF and suspected Pulmonary Embolus with Severe RV Dysfunction -Per family and Patient, she has been on diuretic for a long time, but the edema is more pronounced a fewe weeks ago, patient report sob, dyspnea seems also to be sudden onset a week ago. She denies chest pain, no cough, no syncope. -she has elevated ddimer of 8, intermediate VQ scan, echo with pulmonary arterial hypertension pa pressure 7mmhg, dilated RV with normal levf. -she hasmild elevation of troponin, clear chest x ray. Venous doppler no DVT.  Not able to get CTA due to CKD IV. - Clinically can not rule out the possibility of PE, she is stared on heparin drip since admission and transitioned to Eliquis 10 mg BID x7 days and then 5 mg BID since then. -Discussed with family regarding choice of oral anticoagulation for possible PE, the risk and benefit of coumadin/lovenox and apixaban discussed in detail. Patient and  family elected to proceed with apixaban.  -They understand the plan is to continue heparin to finish total of 48hrs then transition to apixaban 10mg  bid for 7 days, then 5mg  bid for A few months. she need to have repeat renal function monthly, apixaban dose could be further reduced to 2.5 bid if renal function worsening, plan to treat total of 6 months if she can tolerate the treatment. Discussed Risk of possible hemorrhagic bleed if patient falls and hits her head and patient and family understand risk of decompensation and possible death and are agreeable to Anticoagulation -Recommend repeat echocardiogram in three months to evaluate RV and pulmonary artery pressure.  -Will need to follow up with Cardiologist at Discharge  Bilateral Lower Extremity Edema: -from chronic lymphadema/venous insufficiency? Or from cor pulmonale and Grade 1 Diastolic CHF?  -Venous Ultrasound showed no DVT.   -Echo lvef 55-60%, elevated pulmonary artery pressure of 86mmhg, dilated RV with severely reduced systolic Function -She has mild elevation of her troponin/bnp, however has no anginal symptoms,  -elevate legs, -C/w Torsemide 10 mg Daily -Gave one Dose of IV Lasix 20 mg prior to D/C -C/w Metoprolol Succinate 25 mg po Daily -Will need to follow up with Cardiology at Discharge  Chronic Kidney Disease Stage IV -Creatinine last couple years has been anywhere between 2 and 3.5, -Urinalysis concerning for UTI (many bacteria, WBC TNTC, large leukocyte esterase, negative nitrite), urine culture pending, she denies any urinary symptom, no fever, no leukocytosis, urine culture pending, Empirically started on Augmentin and will continue for 5 days -Avoid nephrotoxic agents -Monitor cr while on Diuretics -Patient's BUN/Cr this AM was 45/2.44 -Repeat CMP in AM  E Coli UTI poA -Urinalysis concerning for UTI (many bacteria, WBC TNTC, large leukocyte esterase, negative nitrite), urine culture pending, she denies any  urinary symptom, no fever, no leukocytosis, -Urine culture showed >100,000 only resistant to Ciprofloxacin -Empirically started on Augmentin and will continue for 3.5 more days  Hypertension -Resume home medications of Metoprolol  Pressure ulcers stage 2-3 on buttocks -Patient with limited mobility over the last several months -Wound Care Consult at SNF -PT/SNF placement  Chronic Gout/Arthritis -Patient has been on chronic prednisone, continue.  -Her home pain regimen with Uloric 80 mg po Daily changed to 40 mg daily because of her Kidney Function  Hyperlipidemia -C/w Ezetimibe-Simvastatin 10-20 mg po Tablet  Hypothyroidism -Cw Levothyroxine 112 mcg po Daily  Left Left Cut -Continue to Monitor -Wound Care at SNF -On Empiric Abx with Augmentin  ?Fat Pad seen in Pericardial Space -Can Obtain Cardiac MRI as an outpatient to further assess -Discussed with Dr. Ellyn Hack in Cardiology over the phone and he states she does not need to have it done as an inpatient.  -Will need to follow up with Cardiology in the Outpatient setting.   Discharge Instructions  Discharge Instructions    Call MD for:  difficulty breathing, headache or visual disturbances    Complete by:  As directed    Call MD for:  extreme fatigue    Complete by:  As directed    Call MD for:  hives  Complete by:  As directed    Call MD for:  persistant dizziness or light-headedness    Complete by:  As directed    Call MD for:  persistant nausea and vomiting    Complete by:  As directed    Call MD for:  severe uncontrolled pain    Complete by:  As directed    Call MD for:  temperature >100.4    Complete by:  As directed    Diet - low sodium heart healthy    Complete by:  As directed    Discharge instructions    Complete by:  As directed    Follow up with PCP and with Cardiology after Discharge. Take all medications as prescribed. If symptoms change or worsen please return to the ED for evaluation.    Increase activity slowly    Complete by:  As directed      Allergies as of 12/31/2016      Reactions   Alendronate Sodium Other (See Comments)   Throat swelling   Ketorolac Other (See Comments)   "Retained fluid in lungs"   Ketorolac Tromethamine Other (See Comments)   FLUID RETENTION   Mobic [meloxicam]    Kidney decline   Nsaids Other (See Comments)   Elevated kidney function   Risedronate Sodium Other (See Comments)   Throat swelling and mouth swelling   Septra [sulfamethoxazole-trimethoprim]    Rash   Vioxx [rofecoxib]    Kidney decline   Allopurinol Rash   Sulfa Antibiotics Rash      Medication List    STOP taking these medications   diazepam 5 MG tablet Commonly known as:  VALIUM     TAKE these medications   amoxicillin-clavulanate 500-125 MG tablet Commonly known as:  AUGMENTIN Take 1 tablet (500 mg total) by mouth 2 (two) times daily with a meal.   apixaban 5 MG Tabs tablet Commonly known as:  ELIQUIS Take 2 tablets (10 mg total) by mouth 2 (two) times daily.   apixaban 5 MG Tabs tablet Commonly known as:  ELIQUIS Take 1 tablet (5 mg total) by mouth 2 (two) times daily. Start taking on:  01/08/2017   calcium carbonate 600 MG Tabs tablet Commonly known as:  OS-CAL Take 600 mg by mouth 2 (two) times daily with a meal.   diclofenac sodium 1 % Gel Commonly known as:  VOLTAREN APPLY 2 GRAMS EXTERNALLY TO THE AFFECTED AREA FOUR TIMES DAILY   ezetimibe-simvastatin 10-20 MG tablet Commonly known as:  VYTORIN Take 1 tablet by mouth daily with breakfast.   febuxostat 40 MG tablet Commonly known as:  ULORIC Take 1 tablet (40 mg total) by mouth daily. Start taking on:  01/01/2017 What changed:  medication strength  how much to take   fexofenadine 180 MG tablet Commonly known as:  ALLEGRA Take 180 mg by mouth daily.   gabapentin 100 MG capsule Commonly known as:  NEURONTIN TAKE 2 CAPSULES BY MOUTH THREE TIMES DAILY   Glucosamine HCl 1000 MG  Tabs Take 1,000 mg by mouth 2 (two) times daily.   hydrocerin Crea Apply 1 application topically 2 (two) times daily.   ibuprofen 200 MG tablet Commonly known as:  ADVIL,MOTRIN Take 400 mg by mouth every 6 (six) hours as needed for moderate pain.   levothyroxine 112 MCG tablet Commonly known as:  SYNTHROID, LEVOTHROID Take 112 mcg by mouth daily before breakfast.   metoprolol succinate 25 MG 24 hr tablet Commonly known as:  TOPROL-XL TK 2 T  PO QD   multivitamin with minerals tablet Take 1 tablet by mouth daily.   oxyCODONE 15 MG immediate release tablet Commonly known as:  ROXICODONE Take 1 tablet (15 mg total) by mouth every 8 (eight) hours as needed for pain.   predniSONE 5 MG tablet Commonly known as:  DELTASONE Take 5 mg by mouth daily.   torsemide 10 MG tablet Commonly known as:  DEMADEX Take 1 tablet (10 mg total) by mouth daily. Start taking on:  01/01/2017 What changed:  medication strength  how much to take  when to take this  additional instructions   traMADol 50 MG tablet Commonly known as:  ULTRAM Take 1 tablet (50 mg total) by mouth every 6 (six) hours as needed for moderate pain. What changed:  when to take this  reasons to take this   Vitamin C 500 MG Caps Take 500 mg by mouth daily.   Vitamin D3 2000 units Tabs Take 1 tablet by mouth daily.      Follow-up Information    Reginia Naas, MD. Call in 1 week(s).   Specialty:  Family Medicine Why:  Follow up after SNF Contact information: [place] [address] [phone]          Allergies  Allergen Reactions  . Alendronate Sodium Other (See Comments)    Throat swelling  . Ketorolac Other (See Comments)    "Retained fluid in lungs"  . Ketorolac Tromethamine Other (See Comments)    FLUID RETENTION  . Mobic [Meloxicam]     Kidney decline  . Nsaids Other (See Comments)    Elevated kidney function  . Risedronate Sodium Other (See Comments)     Throat swelling and mouth swelling  . Septra [Sulfamethoxazole-Trimethoprim]     Rash  . Vioxx [Rofecoxib]     Kidney decline  . Allopurinol Rash  . Sulfa Antibiotics Rash   Consultations:  Discussed with Cardiology Dr. Ellyn Hack over the phone about Echo   Procedures/Studies: Dg Chest 2 View  Result Date: 12/28/2016 CLINICAL DATA:  Worsening shortness of breath over this past several days EXAM: CHEST  2 VIEW COMPARISON:  03/26/2015 FINDINGS: Cardiac shadow is stable. The lungs are well aerated bilaterally. No focal infiltrate or sizable effusion is seen. No bony abnormality is noted. IMPRESSION: No active cardiopulmonary disease. Electronically Signed   By: Inez Catalina M.D.   On: 12/28/2016 09:39   Nm Pulmonary Perf And Vent  Result Date: 12/28/2016 CLINICAL DATA:  Shortness of breath. EXAM: NUCLEAR MEDICINE VENTILATION - PERFUSION LUNG SCAN TECHNIQUE: Ventilation images were obtained in multiple projections using inhaled aerosol Tc-57m DTPA. Perfusion images were obtained in multiple projections after intravenous injection of Tc-39m MAA. RADIOPHARMACEUTICALS:  30.0 mCi Technetium-5m DTPA aerosol inhalation and 4.2 mCi Technetium-94m MAA IV COMPARISON:  Chest x-ray 12/28/2016 . FINDINGS: Patchy bilateral correlation perfusion matched defects are present. This is an indeterminate scan for pulmonary embolus. IMPRESSION: Indeterminate scan for pulmonary embolus. Electronically Signed   By: Marcello Moores  Register   On: 12/28/2016 15:00   ECHOCARDIOGRAM Study Conclusions  - Left ventricle: The cavity size was normal. Wall thickness was   increased in a pattern of mild LVH. Systolic function was normal.   The estimated ejection fraction was in the range of 55% to 60%.   Wall motion was normal; there were no regional wall motion   abnormalities. Doppler parameters are consistent with abnormal   left ventricular relaxation (grade 1 diastolic dysfunction). - Right ventricle: The cavity  size was  moderately dilated. Systolic   function was severely reduced. - Right atrium: The atrium was severely dilated. - Pulmonary arteries: Systolic pressure was severely increased. PA   peak pressure: 70 mm Hg (S). - Pericardium, extracardiac: A small pericardial effusion was   identified.  Impressions:  - Normal LV systolic function; grade 1 diastolic dysfunction;   RAE/RVE; severely reduced RV function; mild TR with severely   elevated pulmonary pressure; small pericardial effusion;   echodensity in pericardial space may represent prominent fat pad;   consider cardiac MRI to further assess.  Subjective: Seen and examined and doing well. States she is feeling better. Had vomited this AM after coughing. No CP or SOB. Swelling slightly improved. No other concerns or complaints and ready to go to SNF.  Discharge Exam: Vitals:   12/31/16 0507 12/31/16 1205  BP: (!) 163/61 (!) 161/61  Pulse: 82 82  Resp: 18 16  Temp: 98.4 F (36.9 C) 98.4 F (36.9 C)   Vitals:   12/30/16 1427 12/30/16 2021 12/31/16 0507 12/31/16 1205  BP: (!) 141/73 127/87 (!) 163/61 (!) 161/61  Pulse: 84 100 82 82  Resp: 18 18 18 16   Temp: 97.9 F (36.6 C) 98 F (36.7 C) 98.4 F (36.9 C) 98.4 F (36.9 C)  TempSrc: Oral Oral Oral   SpO2: 92% 93% 96% 95%  Weight:      Height:       General: Pt is alert, awake, not in acute distress Cardiovascular: RRR, S1/S2 +, no rubs, no gallops Respiratory: Diminished bilaterally, no wheezing, no rhonchi Abdominal: Soft, NT, ND, bowel sounds + Extremities: 1+ Lower Extremity edema, Left Leg wrapped; no cyanosis  The results of significant diagnostics from this hospitalization (including imaging, microbiology, ancillary and laboratory) are listed below for reference.    Microbiology: Recent Results (from the past 240 hour(s))  Culture, Urine     Status: Abnormal (Preliminary result)   Collection Time: 12/28/16  5:57 PM  Result Value Ref Range Status   Specimen  Description URINE, RANDOM  Final   Special Requests NONE  Final   Culture >=100,000 COLONIES/mL ESCHERICHIA COLI (A)  Final   Report Status PENDING  Incomplete   Organism ID, Bacteria ESCHERICHIA COLI (A)  Final      Susceptibility   Escherichia coli - MIC*    AMPICILLIN 8 SENSITIVE Sensitive     CEFAZOLIN <=4 SENSITIVE Sensitive     CEFTRIAXONE <=1 SENSITIVE Sensitive     CIPROFLOXACIN >=4 RESISTANT Resistant     GENTAMICIN <=1 SENSITIVE Sensitive     IMIPENEM <=0.25 SENSITIVE Sensitive     NITROFURANTOIN <=16 SENSITIVE Sensitive     TRIMETH/SULFA <=20 SENSITIVE Sensitive     AMPICILLIN/SULBACTAM 4 SENSITIVE Sensitive     PIP/TAZO <=4 SENSITIVE Sensitive     Extended ESBL NEGATIVE Sensitive     * >=100,000 COLONIES/mL ESCHERICHIA COLI    Labs: BNP (last 3 results)  Recent Labs  12/28/16 0947  BNP 517.6*   Basic Metabolic Panel:  Recent Labs Lab 12/28/16 0947 12/29/16 0607 12/30/16 0537 12/31/16 0601  NA 140 141 142 145  K 3.9 4.3 4.0 3.6  CL 110 109 107 111  CO2 21* 23 22 24   GLUCOSE 112* 117* 102* 89  BUN 34* 40* 45* 42*  CREATININE 2.07* 2.24* 2.44* 2.04*  CALCIUM 8.6* 8.7* 8.3* 8.8*  MG  --   --   --  1.9  PHOS  --   --   --  4.0   Liver Function Tests:  Recent Labs Lab 12/29/16 0607 12/31/16 0601  AST 23 26  ALT 14 15  ALKPHOS 131* 114  BILITOT 0.9 0.9  PROT 5.5* 6.5  ALBUMIN 2.8* 3.4*   No results for input(s): LIPASE, AMYLASE in the last 168 hours. No results for input(s): AMMONIA in the last 168 hours. CBC:  Recent Labs Lab 12/28/16 0947 12/29/16 0607 12/30/16 0537 12/31/16 0601  WBC 7.7 6.8 7.2 8.0  NEUTROABS 5.4  --   --  5.4  HGB 12.2 12.2 11.2* 12.1  HCT 37.7 37.6 34.8* 37.6  MCV 92.0 93.8 93.5 93.8  PLT 193 205 205 225   Cardiac Enzymes:  Recent Labs Lab 12/28/16 0947 12/28/16 1229 12/28/16 1911 12/29/16 0011  TROPONINI 0.13* 0.12* 0.10* 0.09*   BNP: Invalid input(s): POCBNP CBG: No results for input(s): GLUCAP in  the last 168 hours. D-Dimer No results for input(s): DDIMER in the last 72 hours. Hgb A1c No results for input(s): HGBA1C in the last 72 hours. Lipid Profile No results for input(s): CHOL, HDL, LDLCALC, TRIG, CHOLHDL, LDLDIRECT in the last 72 hours. Thyroid function studies No results for input(s): TSH, T4TOTAL, T3FREE, THYROIDAB in the last 72 hours.  Invalid input(s): FREET3 Anemia work up No results for input(s): VITAMINB12, FOLATE, FERRITIN, TIBC, IRON, RETICCTPCT in the last 72 hours. Urinalysis    Component Value Date/Time   COLORURINE AMBER (A) 12/28/2016 1757   APPEARANCEUR CLOUDY (A) 12/28/2016 1757   LABSPEC 1.019 12/28/2016 1757   PHURINE 5.0 12/28/2016 1757   GLUCOSEU NEGATIVE 12/28/2016 1757   HGBUR MODERATE (A) 12/28/2016 1757   BILIRUBINUR NEGATIVE 12/28/2016 1757   KETONESUR NEGATIVE 12/28/2016 1757   PROTEINUR 100 (A) 12/28/2016 1757   UROBILINOGEN 1.0 03/26/2015 1521   NITRITE NEGATIVE 12/28/2016 1757   LEUKOCYTESUR LARGE (A) 12/28/2016 1757   Sepsis Labs Invalid input(s): PROCALCITONIN,  WBC,  LACTICIDVEN Microbiology Recent Results (from the past 240 hour(s))  Culture, Urine     Status: Abnormal (Preliminary result)   Collection Time: 12/28/16  5:57 PM  Result Value Ref Range Status   Specimen Description URINE, RANDOM  Final   Special Requests NONE  Final   Culture >=100,000 COLONIES/mL ESCHERICHIA COLI (A)  Final   Report Status PENDING  Incomplete   Organism ID, Bacteria ESCHERICHIA COLI (A)  Final      Susceptibility   Escherichia coli - MIC*    AMPICILLIN 8 SENSITIVE Sensitive     CEFAZOLIN <=4 SENSITIVE Sensitive     CEFTRIAXONE <=1 SENSITIVE Sensitive     CIPROFLOXACIN >=4 RESISTANT Resistant     GENTAMICIN <=1 SENSITIVE Sensitive     IMIPENEM <=0.25 SENSITIVE Sensitive     NITROFURANTOIN <=16 SENSITIVE Sensitive     TRIMETH/SULFA <=20 SENSITIVE Sensitive     AMPICILLIN/SULBACTAM 4 SENSITIVE Sensitive     PIP/TAZO <=4 SENSITIVE Sensitive      Extended ESBL NEGATIVE Sensitive     * >=100,000 COLONIES/mL ESCHERICHIA COLI   Time coordinating discharge: 40 minutes  SIGNED:  Kerney Elbe, DO [facility] 12/31/2016, 12:55 PM Pager [phone]  If 7PM-7AM, please contact night-coverage [email or web address] Password [email or web address]

## 2016-12-31 NOTE — Clinical Social Work Placement (Addendum)
   CLINICAL SOCIAL WORK PLACEMENT  NOTE  Date:  12/31/2016  Patient Details  Name: Kari Martinez MRN: [id number] Date of Birth: 1924/05/30  Clinical Social Work is seeking post-discharge placement for this patient at the [place] level of care (*CSW will initial, date and re-position this form in  chart as items are completed):  Yes   Patient/family provided with [facility] Work Department's list of facilities offering this level of care within the geographic area requested by the patient (or if unable, by the patient's family).  Yes   Patient/family informed of their freedom to choose among providers that offer the needed level of care, that participate in Medicare, Medicaid or managed care program needed by the patient, have an available bed and are willing to accept the patient.  Yes   Patient/family informed of [facility]'s ownership interest in [facility] and [facility], as well as of the fact that they are under no obligation to receive care at these facilities.  PASRR submitted to EDS on 12/31/16     PASRR number received on       Existing PASRR number confirmed on       FL2 transmitted to all facilities in geographic area requested by pt/family on 12/31/16     FL2 transmitted to all facilities within larger geographic area on       Patient informed that his/her managed care company has contracts with or will negotiate with certain facilities, including the following:        Yes   Patient/family informed of bed offers received.  Patient chooses bed at  ([facility])     Physician recommends and patient chooses bed at      Patient to be transferred to  ([facility]) on 12/31/16.  Patient to be transferred to facility by  (family)     Patient family notified on 12/31/16 of transfer.  Name of family member notified:   [name], daughter [phone])     PHYSICIAN       Additional  Comment: Pt and her daughter agreeable to d/c to [facility] today. Transportation to SNF provided by daughter at pt's/daughter's request.  CSW spoke with representative at SNF to confirm facility ready for pt's arrival and faxed d/c summary.  Provided number for report to RN. Scripts included in d/c packet.   _______________________________________________ Nila Nephew, LCSW  [phone] 12/31/2016, 3:04 PM

## 2016-12-31 NOTE — Progress Notes (Signed)
qPhysical Therapy Treatment Patient Details Name: DACY ENRICO MRN: [id number] DOB: 04/13/1924 Today's Date: 12/31/2016    History of Present Illness KERRIANNE JENG is a 83 y.o. female with medical history significant of arthritis, prior gout, hypertension, hyperlipidemia, chronic kidney disease stage IV, presents to the emergency room with a chief complaint of shortness of breath and dyspnea on exertion.  VQ scan Patchy bilateral correlation perfusion matched defects are present  and indeterminant for PE.    PT Comments    Assisted out of recliner to amb a limited distance in hallway.  Required extended sitting rest break then amb back.   Pt lives home alone and will need ST Rehab at [facility].    Follow Up Recommendations  SNF     Equipment Recommendations       Recommendations for Other Services       Precautions / Restrictions Precautions Precautions: Fall Precaution Comments: monitor sats Restrictions Weight Bearing Restrictions: No    Mobility  Bed Mobility Overal bed mobility:  (Sitting EOB upon OT arrival)             General bed mobility comments: in recliner  Transfers Overall transfer level: Needs assistance Equipment used: Rolling walker (2 wheeled) Transfers: Sit to/from [facility] Sit to Stand: Min assist (VC's for safety and sequencing) Stand pivot transfers: Min assist       General transfer comment: 25% VC's on proper hand placement to avoid pulling self up on walker.  50% VC's on turn completion and hand placement to reach back to control decend.    Ambulation/Gait Ambulation/Gait assistance: Mod assist Ambulation Distance (Feet): 24 Feet (12 feet x 2 with one extended sitting rest break between.  ) Assistive device: Rolling walker (2 wheeled) Gait Pattern/deviations: Step-to pattern;Decreased stance time - left Gait velocity: decreased   General Gait Details: limited distance due to weakness, fatigue and l knee pain/weakness "bad  knee".  RA avg 94% with HR 66.     Stairs            Wheelchair Mobility    Modified Rankin (Stroke Patients Only)       Balance Overall balance assessment: Needs assistance Sitting-balance support: No upper extremity supported;Feet supported Sitting balance-Leahy Scale: Good     Standing balance support: Bilateral upper extremity supported;During functional activity Standing balance-Leahy Scale: Fair Standing balance comment: O2 SATs fluctuate (84-92) and are limiting factor as well as decreased endurance/activity tolerance                            Cognition Arousal/Alertness: Awake/alert Behavior During Therapy: WFL for tasks assessed/performed Overall Cognitive Status: Within Functional Limits for tasks assessed                                        Exercises      General Comments        Pertinent Vitals/Pain Pain Assessment: No/denies pain    Home Living                      Prior Function            PT Goals (current goals can now be found in the care plan section) Acute Rehab PT Goals Patient Stated Goal: Go to rehab near daughter. Progress towards PT goals: Progressing toward goals  Frequency    Min 3X/week      PT Plan Current plan remains appropriate    Co-evaluation             End of Session Equipment Utilized During Treatment: Gait belt Activity Tolerance: Patient limited by fatigue Patient left: in chair;with chair alarm set;with call bell/phone within reach   PT Visit Diagnosis: Unsteadiness on feet (R26.81)     Time: 2202-5427 PT Time Calculation (min) (ACUTE ONLY): 21 min  Charges:  $Gait Training: 8-22 mins                    G Codes:       {Deashia Soule  PTA [facility]  Acute  Rehab Pager      [phone]

## 2017-01-01 LAB — URINE CULTURE

## 2017-01-05 DIAGNOSIS — E785 Hyperlipidemia, unspecified: Secondary | ICD-10-CM | POA: Diagnosis not present

## 2017-01-05 DIAGNOSIS — I2699 Other pulmonary embolism without acute cor pulmonale: Secondary | ICD-10-CM | POA: Diagnosis not present

## 2017-01-05 DIAGNOSIS — M1990 Unspecified osteoarthritis, unspecified site: Secondary | ICD-10-CM | POA: Diagnosis not present

## 2017-01-05 DIAGNOSIS — I5030 Unspecified diastolic (congestive) heart failure: Secondary | ICD-10-CM | POA: Diagnosis not present

## 2017-01-13 ENCOUNTER — Telehealth: Payer: Self-pay | Admitting: Physical Medicine & Rehabilitation

## 2017-01-13 NOTE — Telephone Encounter (Signed)
error 

## 2017-01-15 DIAGNOSIS — E782 Mixed hyperlipidemia: Secondary | ICD-10-CM | POA: Diagnosis not present

## 2017-01-15 DIAGNOSIS — I10 Essential (primary) hypertension: Secondary | ICD-10-CM | POA: Diagnosis not present

## 2017-01-15 DIAGNOSIS — I2609 Other pulmonary embolism with acute cor pulmonale: Secondary | ICD-10-CM | POA: Diagnosis not present

## 2017-01-15 DIAGNOSIS — I5032 Chronic diastolic (congestive) heart failure: Secondary | ICD-10-CM | POA: Diagnosis not present

## 2017-01-19 ENCOUNTER — Encounter: Payer: Medicare Other | Admitting: Registered Nurse

## 2017-01-19 DIAGNOSIS — Z111 Encounter for screening for respiratory tuberculosis: Secondary | ICD-10-CM | POA: Diagnosis not present

## 2017-01-19 DIAGNOSIS — I10 Essential (primary) hypertension: Secondary | ICD-10-CM | POA: Diagnosis not present

## 2017-01-19 DIAGNOSIS — I5032 Chronic diastolic (congestive) heart failure: Secondary | ICD-10-CM | POA: Diagnosis not present

## 2017-01-19 DIAGNOSIS — E782 Mixed hyperlipidemia: Secondary | ICD-10-CM | POA: Diagnosis not present

## 2017-01-22 ENCOUNTER — Telehealth: Payer: Self-pay | Admitting: *Deleted

## 2017-01-22 NOTE — Telephone Encounter (Signed)
Urine drug screen for this encounter is consistent for prescribed medication.  She is not currently taking diazepam so being absent is expected.

## 2017-01-28 ENCOUNTER — Telehealth: Payer: Self-pay

## 2017-01-28 DIAGNOSIS — G894 Chronic pain syndrome: Secondary | ICD-10-CM

## 2017-01-28 DIAGNOSIS — M1712 Unilateral primary osteoarthritis, left knee: Secondary | ICD-10-CM

## 2017-01-28 DIAGNOSIS — M1612 Unilateral primary osteoarthritis, left hip: Secondary | ICD-10-CM

## 2017-01-28 DIAGNOSIS — M47816 Spondylosis without myelopathy or radiculopathy, lumbar region: Secondary | ICD-10-CM

## 2017-01-28 NOTE — Telephone Encounter (Signed)
Kari Martinez (patients daughter)  has LVM stating she would like a referral for her mother  if possble, to a pain management Clinic in the [place] area.  Please advise

## 2017-01-28 NOTE — Telephone Encounter (Signed)
Is the patient moving?. If so, I can recommend the [facility] in [place] and offices in [place]

## 2017-01-29 DIAGNOSIS — M25611 Stiffness of right shoulder, not elsewhere classified: Secondary | ICD-10-CM | POA: Diagnosis not present

## 2017-01-29 DIAGNOSIS — I129 Hypertensive chronic kidney disease with stage 1 through stage 4 chronic kidney disease, or unspecified chronic kidney disease: Secondary | ICD-10-CM | POA: Diagnosis not present

## 2017-01-29 DIAGNOSIS — I2699 Other pulmonary embolism without acute cor pulmonale: Secondary | ICD-10-CM | POA: Diagnosis not present

## 2017-01-29 DIAGNOSIS — L89322 Pressure ulcer of left buttock, stage 2: Secondary | ICD-10-CM | POA: Diagnosis not present

## 2017-01-29 DIAGNOSIS — L89312 Pressure ulcer of right buttock, stage 2: Secondary | ICD-10-CM | POA: Diagnosis not present

## 2017-01-29 NOTE — Telephone Encounter (Signed)
Referral placed.

## 2017-02-03 DIAGNOSIS — M47816 Spondylosis without myelopathy or radiculopathy, lumbar region: Secondary | ICD-10-CM | POA: Diagnosis not present

## 2017-02-03 DIAGNOSIS — M25552 Pain in left hip: Secondary | ICD-10-CM | POA: Diagnosis not present

## 2017-02-03 DIAGNOSIS — M1612 Unilateral primary osteoarthritis, left hip: Secondary | ICD-10-CM | POA: Diagnosis not present

## 2017-02-03 DIAGNOSIS — M25562 Pain in left knee: Secondary | ICD-10-CM | POA: Diagnosis not present

## 2017-02-03 DIAGNOSIS — M4800 Spinal stenosis, site unspecified: Secondary | ICD-10-CM | POA: Diagnosis not present

## 2017-02-03 DIAGNOSIS — M1712 Unilateral primary osteoarthritis, left knee: Secondary | ICD-10-CM | POA: Diagnosis not present

## 2017-02-19 DIAGNOSIS — D649 Anemia, unspecified: Secondary | ICD-10-CM | POA: Diagnosis not present

## 2017-02-19 DIAGNOSIS — M1712 Unilateral primary osteoarthritis, left knee: Secondary | ICD-10-CM | POA: Diagnosis not present

## 2017-02-19 DIAGNOSIS — M1612 Unilateral primary osteoarthritis, left hip: Secondary | ICD-10-CM | POA: Diagnosis not present

## 2017-02-19 DIAGNOSIS — M47816 Spondylosis without myelopathy or radiculopathy, lumbar region: Secondary | ICD-10-CM | POA: Diagnosis not present

## 2017-02-19 DIAGNOSIS — I10 Essential (primary) hypertension: Secondary | ICD-10-CM | POA: Diagnosis not present

## 2017-02-19 DIAGNOSIS — I5032 Chronic diastolic (congestive) heart failure: Secondary | ICD-10-CM | POA: Diagnosis not present

## 2017-02-19 DIAGNOSIS — Z7901 Long term (current) use of anticoagulants: Secondary | ICD-10-CM | POA: Diagnosis not present

## 2017-03-03 DIAGNOSIS — M25552 Pain in left hip: Secondary | ICD-10-CM | POA: Diagnosis not present

## 2017-03-31 DIAGNOSIS — M25562 Pain in left knee: Secondary | ICD-10-CM | POA: Diagnosis not present

## 2017-04-01 DIAGNOSIS — I5032 Chronic diastolic (congestive) heart failure: Secondary | ICD-10-CM | POA: Diagnosis not present

## 2017-04-01 DIAGNOSIS — I130 Hypertensive heart and chronic kidney disease with heart failure and stage 1 through stage 4 chronic kidney disease, or unspecified chronic kidney disease: Secondary | ICD-10-CM | POA: Diagnosis not present

## 2017-04-01 DIAGNOSIS — G8929 Other chronic pain: Secondary | ICD-10-CM | POA: Diagnosis not present

## 2017-04-01 DIAGNOSIS — I2609 Other pulmonary embolism with acute cor pulmonale: Secondary | ICD-10-CM | POA: Diagnosis not present

## 2017-04-01 DIAGNOSIS — L89322 Pressure ulcer of left buttock, stage 2: Secondary | ICD-10-CM | POA: Diagnosis not present

## 2017-04-09 DIAGNOSIS — Z7901 Long term (current) use of anticoagulants: Secondary | ICD-10-CM | POA: Diagnosis not present

## 2017-04-09 DIAGNOSIS — I5032 Chronic diastolic (congestive) heart failure: Secondary | ICD-10-CM | POA: Diagnosis not present

## 2017-04-09 DIAGNOSIS — L89309 Pressure ulcer of unspecified buttock, unspecified stage: Secondary | ICD-10-CM | POA: Diagnosis not present

## 2017-04-09 DIAGNOSIS — M1712 Unilateral primary osteoarthritis, left knee: Secondary | ICD-10-CM | POA: Diagnosis not present

## 2017-04-16 DIAGNOSIS — I5032 Chronic diastolic (congestive) heart failure: Secondary | ICD-10-CM | POA: Diagnosis not present

## 2017-04-16 DIAGNOSIS — I2609 Other pulmonary embolism with acute cor pulmonale: Secondary | ICD-10-CM | POA: Diagnosis not present

## 2017-04-16 DIAGNOSIS — E782 Mixed hyperlipidemia: Secondary | ICD-10-CM | POA: Diagnosis not present

## 2017-04-16 DIAGNOSIS — I2782 Chronic pulmonary embolism: Secondary | ICD-10-CM | POA: Diagnosis not present

## 2017-04-16 DIAGNOSIS — I10 Essential (primary) hypertension: Secondary | ICD-10-CM | POA: Diagnosis not present

## 2017-05-19 DIAGNOSIS — Z79899 Other long term (current) drug therapy: Secondary | ICD-10-CM | POA: Diagnosis not present

## 2017-05-19 DIAGNOSIS — M47817 Spondylosis without myelopathy or radiculopathy, lumbosacral region: Secondary | ICD-10-CM | POA: Diagnosis not present

## 2017-05-21 DIAGNOSIS — M4800 Spinal stenosis, site unspecified: Secondary | ICD-10-CM | POA: Diagnosis not present

## 2017-05-21 DIAGNOSIS — L89309 Pressure ulcer of unspecified buttock, unspecified stage: Secondary | ICD-10-CM | POA: Diagnosis not present

## 2017-05-21 DIAGNOSIS — I10 Essential (primary) hypertension: Secondary | ICD-10-CM | POA: Diagnosis not present

## 2017-05-21 DIAGNOSIS — G8929 Other chronic pain: Secondary | ICD-10-CM | POA: Diagnosis not present

## 2017-06-16 DIAGNOSIS — M47816 Spondylosis without myelopathy or radiculopathy, lumbar region: Secondary | ICD-10-CM | POA: Diagnosis not present

## 2017-06-30 DIAGNOSIS — M150 Primary generalized (osteo)arthritis: Secondary | ICD-10-CM | POA: Diagnosis not present

## 2017-06-30 DIAGNOSIS — N182 Chronic kidney disease, stage 2 (mild): Secondary | ICD-10-CM | POA: Diagnosis not present

## 2017-06-30 DIAGNOSIS — Z9181 History of falling: Secondary | ICD-10-CM | POA: Diagnosis not present

## 2017-06-30 DIAGNOSIS — L989 Disorder of the skin and subcutaneous tissue, unspecified: Secondary | ICD-10-CM | POA: Diagnosis not present

## 2017-06-30 DIAGNOSIS — I129 Hypertensive chronic kidney disease with stage 1 through stage 4 chronic kidney disease, or unspecified chronic kidney disease: Secondary | ICD-10-CM | POA: Diagnosis not present

## 2017-07-12 IMAGING — CR DG CHEST 2V
2 series · 2 of 2 positions shown · non-contrast
Comparison: 03/26/2015

CLINICAL DATA: Worsening shortness of breath over this past several
days

EXAM:
CHEST  2 VIEW

[x chest ap]
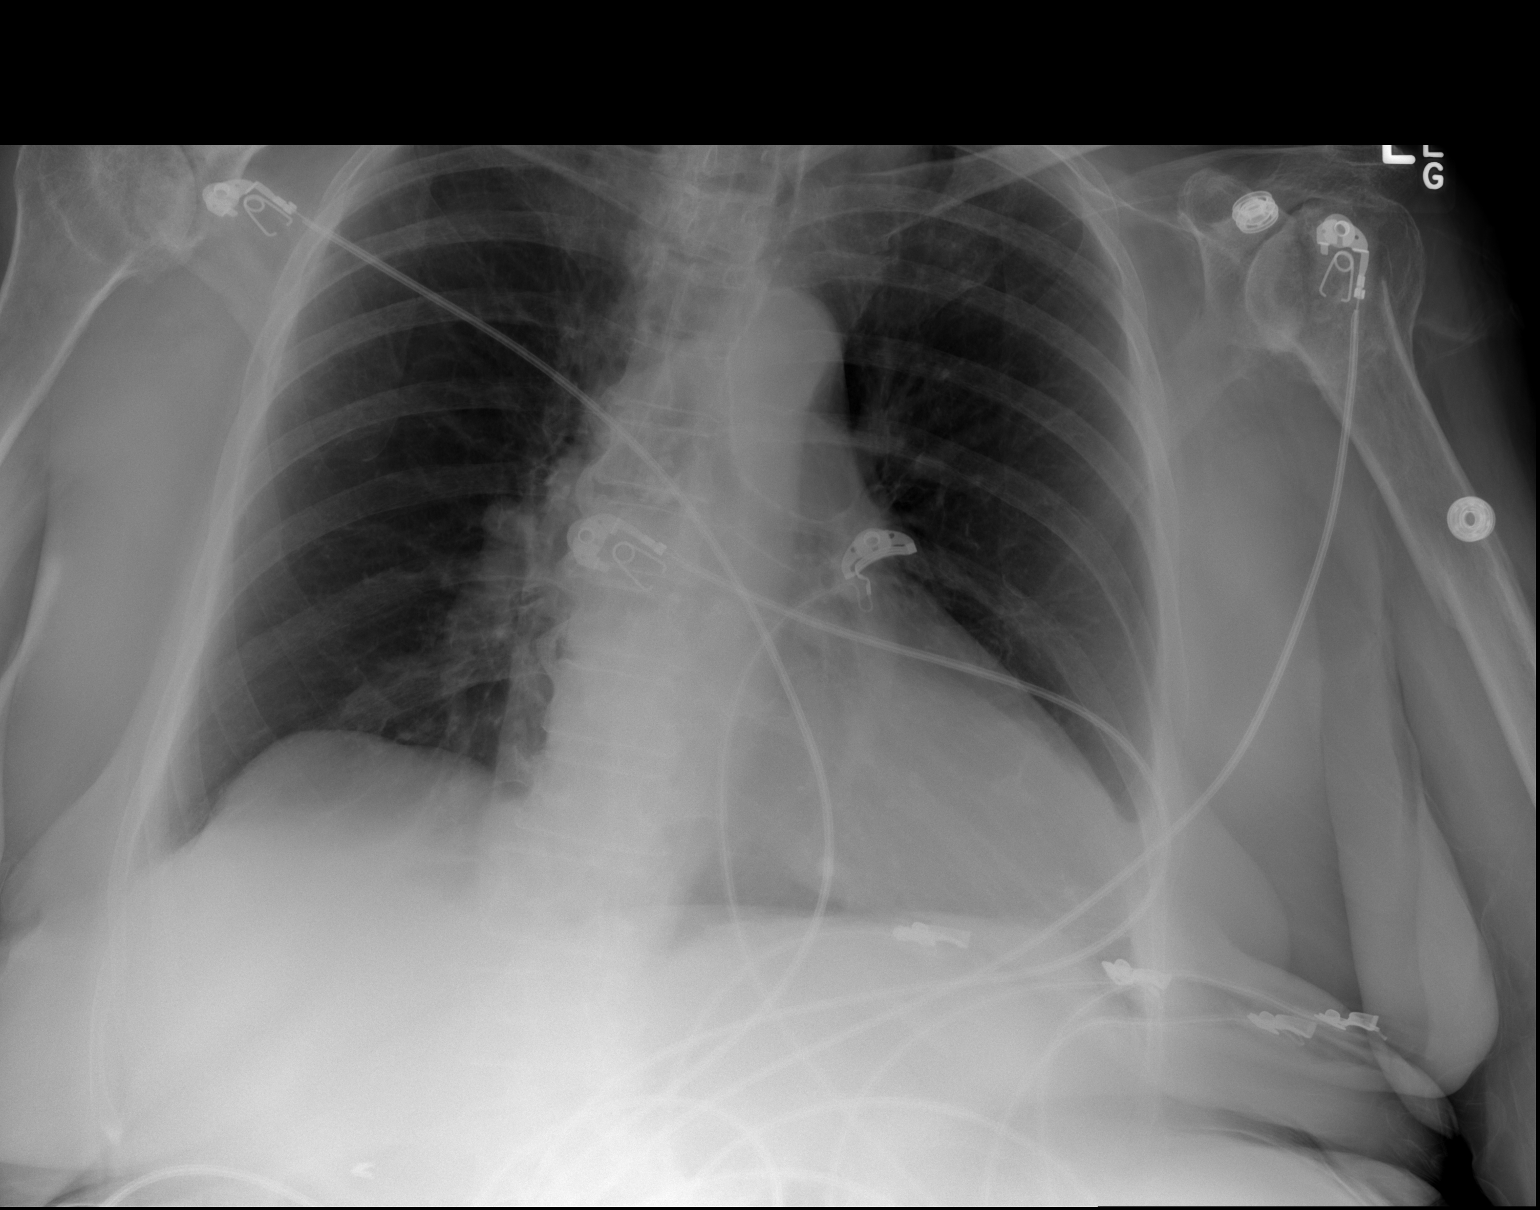

[w chest lat]
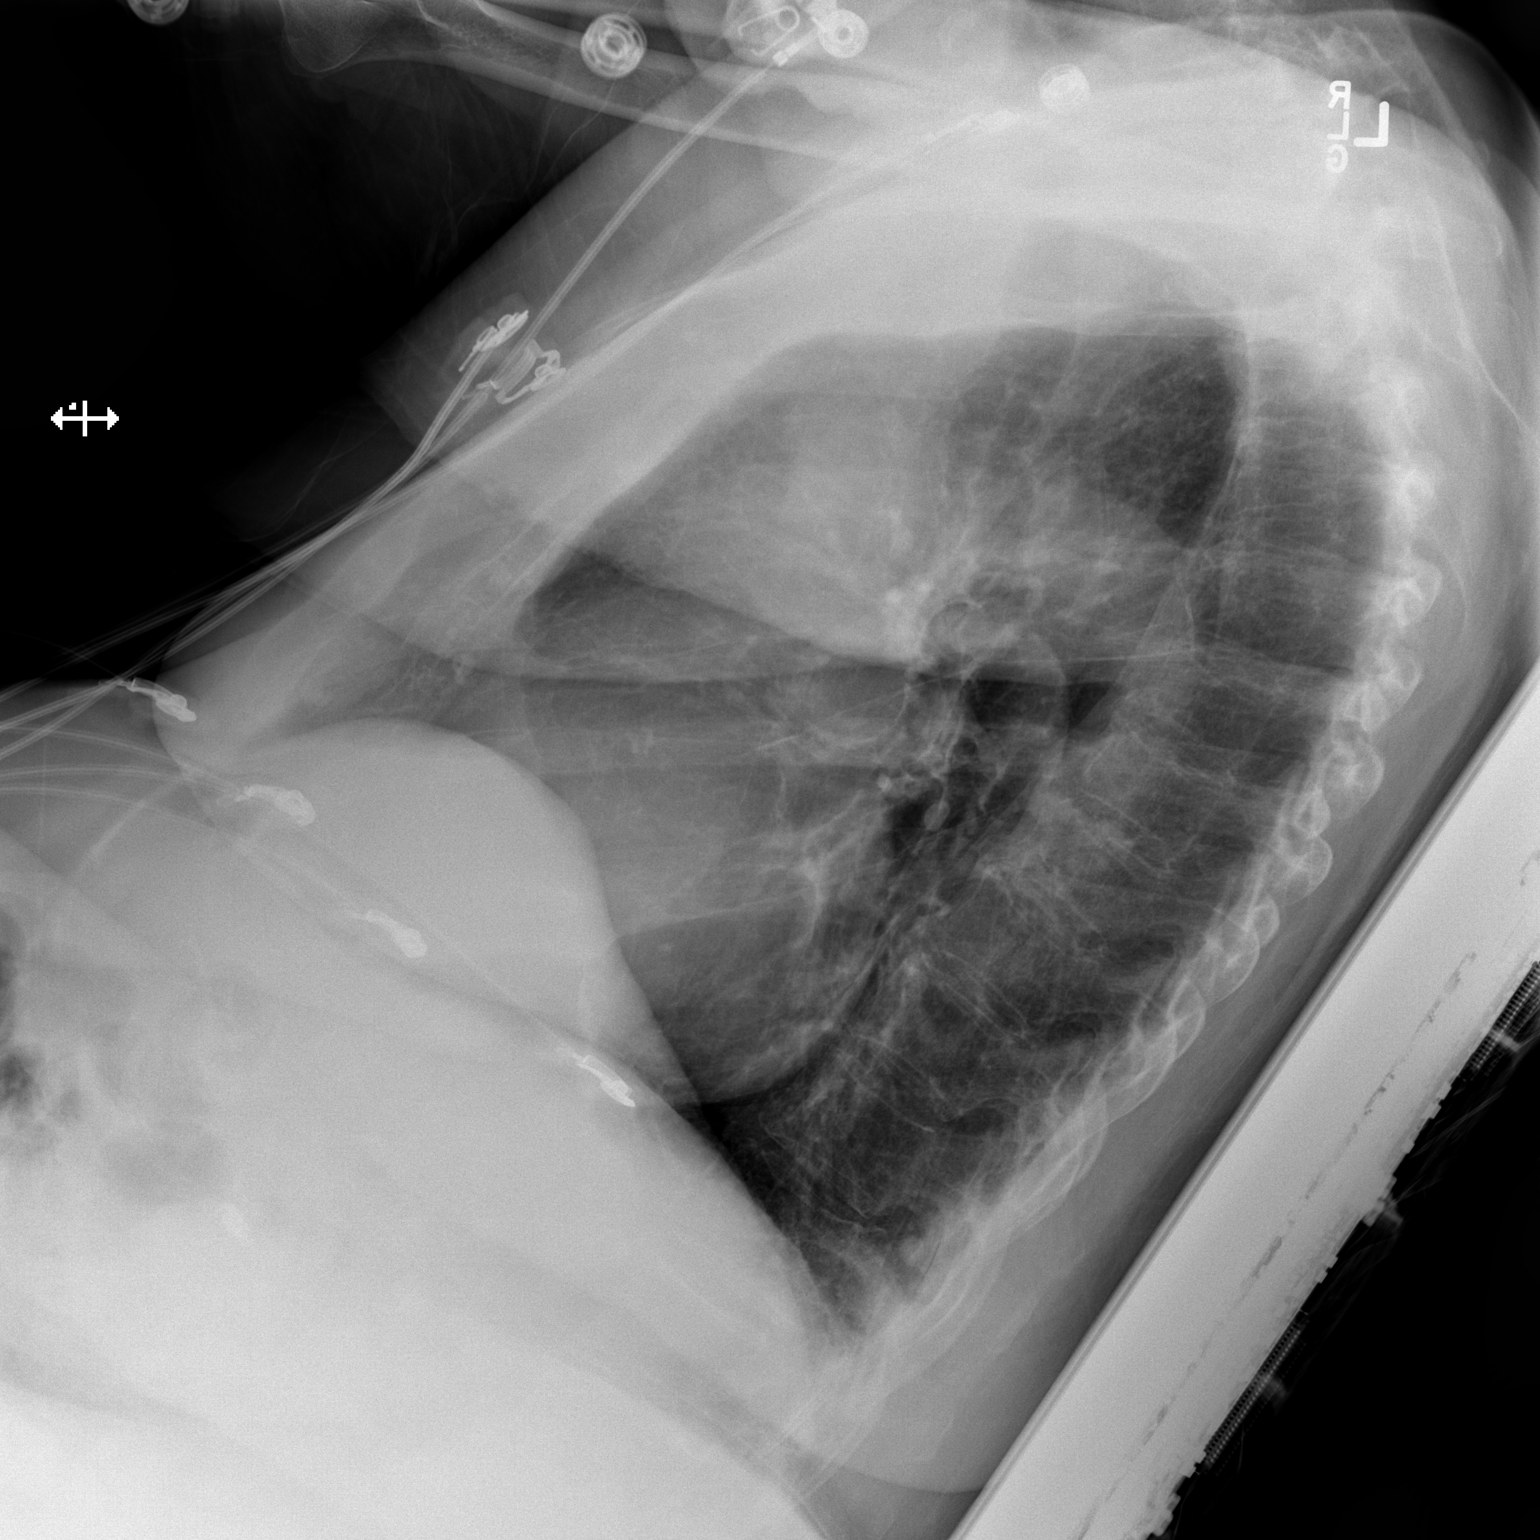

[2 of 2 positions shown; findings below may reference images not displayed]

FINDINGS: Cardiac shadow is stable. The lungs are well aerated bilaterally. No
focal infiltrate or sizable effusion is seen. No bony abnormality is
noted.
IMPRESSION: No active cardiopulmonary disease.

## 2017-07-23 DIAGNOSIS — H8110 Benign paroxysmal vertigo, unspecified ear: Secondary | ICD-10-CM | POA: Diagnosis not present

## 2017-08-19 DIAGNOSIS — G8929 Other chronic pain: Secondary | ICD-10-CM | POA: Diagnosis not present

## 2017-08-19 DIAGNOSIS — E039 Hypothyroidism, unspecified: Secondary | ICD-10-CM | POA: Diagnosis not present

## 2017-08-19 DIAGNOSIS — I10 Essential (primary) hypertension: Secondary | ICD-10-CM | POA: Diagnosis not present

## 2017-08-19 DIAGNOSIS — I5032 Chronic diastolic (congestive) heart failure: Secondary | ICD-10-CM | POA: Diagnosis not present

## 2017-09-01 DIAGNOSIS — M25562 Pain in left knee: Secondary | ICD-10-CM | POA: Diagnosis not present

## 2017-09-16 DIAGNOSIS — L89301 Pressure ulcer of unspecified buttock, stage 1: Secondary | ICD-10-CM | POA: Diagnosis not present

## 2017-09-24 DIAGNOSIS — K7689 Other specified diseases of liver: Secondary | ICD-10-CM | POA: Diagnosis not present

## 2017-10-22 DIAGNOSIS — M19011 Primary osteoarthritis, right shoulder: Secondary | ICD-10-CM | POA: Diagnosis not present

## 2017-10-27 DIAGNOSIS — M25519 Pain in unspecified shoulder: Secondary | ICD-10-CM | POA: Diagnosis not present

## 2017-11-03 DIAGNOSIS — M25511 Pain in right shoulder: Secondary | ICD-10-CM | POA: Diagnosis not present

## 2017-11-10 DIAGNOSIS — H5213 Myopia, bilateral: Secondary | ICD-10-CM | POA: Diagnosis not present

## 2017-11-22 DIAGNOSIS — R748 Abnormal levels of other serum enzymes: Secondary | ICD-10-CM | POA: Diagnosis not present

## 2017-11-22 DIAGNOSIS — G8929 Other chronic pain: Secondary | ICD-10-CM | POA: Diagnosis not present

## 2017-11-22 DIAGNOSIS — I10 Essential (primary) hypertension: Secondary | ICD-10-CM | POA: Diagnosis not present

## 2017-11-22 DIAGNOSIS — Z7952 Long term (current) use of systemic steroids: Secondary | ICD-10-CM | POA: Diagnosis not present

## 2018-01-17 DIAGNOSIS — L600 Ingrowing nail: Secondary | ICD-10-CM | POA: Diagnosis not present

## 2018-01-17 DIAGNOSIS — M79675 Pain in left toe(s): Secondary | ICD-10-CM | POA: Diagnosis not present

## 2018-01-17 DIAGNOSIS — B351 Tinea unguium: Secondary | ICD-10-CM | POA: Diagnosis not present

## 2018-01-17 DIAGNOSIS — M79674 Pain in right toe(s): Secondary | ICD-10-CM | POA: Diagnosis not present

## 2018-01-26 DIAGNOSIS — M47817 Spondylosis without myelopathy or radiculopathy, lumbosacral region: Secondary | ICD-10-CM | POA: Diagnosis not present

## 2018-03-15 DIAGNOSIS — M79601 Pain in right arm: Secondary | ICD-10-CM | POA: Diagnosis not present

## 2018-03-15 DIAGNOSIS — M19011 Primary osteoarthritis, right shoulder: Secondary | ICD-10-CM | POA: Diagnosis not present

## 2018-03-15 DIAGNOSIS — I10 Essential (primary) hypertension: Secondary | ICD-10-CM | POA: Diagnosis not present

## 2018-03-15 DIAGNOSIS — Z7952 Long term (current) use of systemic steroids: Secondary | ICD-10-CM | POA: Diagnosis not present

## 2018-03-15 DIAGNOSIS — G5602 Carpal tunnel syndrome, left upper limb: Secondary | ICD-10-CM | POA: Diagnosis not present

## 2018-03-16 DIAGNOSIS — M47817 Spondylosis without myelopathy or radiculopathy, lumbosacral region: Secondary | ICD-10-CM | POA: Diagnosis not present

## 2018-03-18 DIAGNOSIS — M19012 Primary osteoarthritis, left shoulder: Secondary | ICD-10-CM | POA: Diagnosis not present

## 2018-03-18 DIAGNOSIS — M19011 Primary osteoarthritis, right shoulder: Secondary | ICD-10-CM | POA: Diagnosis not present

## 2018-03-18 DIAGNOSIS — G5602 Carpal tunnel syndrome, left upper limb: Secondary | ICD-10-CM | POA: Diagnosis not present

## 2018-03-18 DIAGNOSIS — N182 Chronic kidney disease, stage 2 (mild): Secondary | ICD-10-CM | POA: Diagnosis not present

## 2018-03-18 DIAGNOSIS — I129 Hypertensive chronic kidney disease with stage 1 through stage 4 chronic kidney disease, or unspecified chronic kidney disease: Secondary | ICD-10-CM | POA: Diagnosis not present

## 2018-04-19 DIAGNOSIS — M47816 Spondylosis without myelopathy or radiculopathy, lumbar region: Secondary | ICD-10-CM | POA: Diagnosis not present

## 2018-04-19 DIAGNOSIS — M25561 Pain in right knee: Secondary | ICD-10-CM | POA: Diagnosis not present

## 2018-04-19 DIAGNOSIS — M2550 Pain in unspecified joint: Secondary | ICD-10-CM | POA: Diagnosis not present

## 2018-04-19 DIAGNOSIS — M25552 Pain in left hip: Secondary | ICD-10-CM | POA: Diagnosis not present

## 2018-04-22 DIAGNOSIS — M25552 Pain in left hip: Secondary | ICD-10-CM | POA: Diagnosis not present

## 2018-05-20 DIAGNOSIS — G8929 Other chronic pain: Secondary | ICD-10-CM | POA: Diagnosis not present

## 2018-05-20 DIAGNOSIS — G894 Chronic pain syndrome: Secondary | ICD-10-CM | POA: Diagnosis not present

## 2018-05-20 DIAGNOSIS — M545 Low back pain: Secondary | ICD-10-CM | POA: Diagnosis not present

## 2018-05-20 DIAGNOSIS — M1612 Unilateral primary osteoarthritis, left hip: Secondary | ICD-10-CM | POA: Diagnosis not present

## 2018-06-16 DIAGNOSIS — I10 Essential (primary) hypertension: Secondary | ICD-10-CM | POA: Diagnosis not present

## 2018-06-16 DIAGNOSIS — E039 Hypothyroidism, unspecified: Secondary | ICD-10-CM | POA: Diagnosis not present

## 2018-06-16 DIAGNOSIS — G8929 Other chronic pain: Secondary | ICD-10-CM | POA: Diagnosis not present

## 2018-06-16 DIAGNOSIS — I5032 Chronic diastolic (congestive) heart failure: Secondary | ICD-10-CM | POA: Diagnosis not present

## 2018-06-16 DIAGNOSIS — M25511 Pain in right shoulder: Secondary | ICD-10-CM | POA: Diagnosis not present

## 2018-06-20 DIAGNOSIS — M79675 Pain in left toe(s): Secondary | ICD-10-CM | POA: Diagnosis not present

## 2018-06-20 DIAGNOSIS — B351 Tinea unguium: Secondary | ICD-10-CM | POA: Diagnosis not present

## 2018-06-20 DIAGNOSIS — L600 Ingrowing nail: Secondary | ICD-10-CM | POA: Diagnosis not present

## 2018-06-20 DIAGNOSIS — M79674 Pain in right toe(s): Secondary | ICD-10-CM | POA: Diagnosis not present

## 2018-06-27 DIAGNOSIS — I5032 Chronic diastolic (congestive) heart failure: Secondary | ICD-10-CM | POA: Diagnosis not present

## 2018-06-27 DIAGNOSIS — I10 Essential (primary) hypertension: Secondary | ICD-10-CM | POA: Diagnosis not present

## 2018-08-11 DIAGNOSIS — G894 Chronic pain syndrome: Secondary | ICD-10-CM | POA: Diagnosis not present

## 2018-08-11 DIAGNOSIS — M1610 Unilateral primary osteoarthritis, unspecified hip: Secondary | ICD-10-CM | POA: Diagnosis not present

## 2018-08-11 DIAGNOSIS — G8929 Other chronic pain: Secondary | ICD-10-CM | POA: Diagnosis not present

## 2018-08-11 DIAGNOSIS — M25511 Pain in right shoulder: Secondary | ICD-10-CM | POA: Diagnosis not present

## 2018-08-11 DIAGNOSIS — M25552 Pain in left hip: Secondary | ICD-10-CM | POA: Diagnosis not present

## 2018-08-11 DIAGNOSIS — M545 Low back pain: Secondary | ICD-10-CM | POA: Diagnosis not present

## 2018-08-11 DIAGNOSIS — M25562 Pain in left knee: Secondary | ICD-10-CM | POA: Diagnosis not present

## 2018-09-19 DIAGNOSIS — M25511 Pain in right shoulder: Secondary | ICD-10-CM | POA: Diagnosis not present

## 2018-09-19 DIAGNOSIS — R5381 Other malaise: Secondary | ICD-10-CM | POA: Diagnosis not present

## 2018-09-19 DIAGNOSIS — I10 Essential (primary) hypertension: Secondary | ICD-10-CM | POA: Diagnosis not present

## 2018-09-19 DIAGNOSIS — I5032 Chronic diastolic (congestive) heart failure: Secondary | ICD-10-CM | POA: Diagnosis not present

## 2018-10-20 DIAGNOSIS — I739 Peripheral vascular disease, unspecified: Secondary | ICD-10-CM | POA: Diagnosis not present

## 2018-10-20 DIAGNOSIS — M79674 Pain in right toe(s): Secondary | ICD-10-CM | POA: Diagnosis not present

## 2018-10-20 DIAGNOSIS — L600 Ingrowing nail: Secondary | ICD-10-CM | POA: Diagnosis not present

## 2018-10-20 DIAGNOSIS — M79675 Pain in left toe(s): Secondary | ICD-10-CM | POA: Diagnosis not present

## 2018-11-07 DIAGNOSIS — M25552 Pain in left hip: Secondary | ICD-10-CM | POA: Diagnosis not present

## 2018-11-07 DIAGNOSIS — M47816 Spondylosis without myelopathy or radiculopathy, lumbar region: Secondary | ICD-10-CM | POA: Diagnosis not present

## 2018-11-07 DIAGNOSIS — Z886 Allergy status to analgesic agent status: Secondary | ICD-10-CM | POA: Diagnosis not present

## 2018-11-07 DIAGNOSIS — M25562 Pain in left knee: Secondary | ICD-10-CM | POA: Diagnosis not present

## 2018-11-07 DIAGNOSIS — Z888 Allergy status to other drugs, medicaments and biological substances status: Secondary | ICD-10-CM | POA: Diagnosis not present

## 2018-11-07 DIAGNOSIS — Z882 Allergy status to sulfonamides status: Secondary | ICD-10-CM | POA: Diagnosis not present

## 2018-11-07 DIAGNOSIS — Z7901 Long term (current) use of anticoagulants: Secondary | ICD-10-CM | POA: Diagnosis not present

## 2018-12-08 DIAGNOSIS — M25562 Pain in left knee: Secondary | ICD-10-CM | POA: Diagnosis not present

## 2018-12-08 DIAGNOSIS — M25559 Pain in unspecified hip: Secondary | ICD-10-CM | POA: Diagnosis not present

## 2018-12-08 DIAGNOSIS — G894 Chronic pain syndrome: Secondary | ICD-10-CM | POA: Diagnosis not present

## 2018-12-08 DIAGNOSIS — Z5181 Encounter for therapeutic drug level monitoring: Secondary | ICD-10-CM | POA: Diagnosis not present

## 2018-12-08 DIAGNOSIS — M545 Low back pain: Secondary | ICD-10-CM | POA: Diagnosis not present

## 2018-12-08 DIAGNOSIS — M1610 Unilateral primary osteoarthritis, unspecified hip: Secondary | ICD-10-CM | POA: Diagnosis not present

## 2018-12-08 DIAGNOSIS — Z79891 Long term (current) use of opiate analgesic: Secondary | ICD-10-CM | POA: Diagnosis not present

## 2019-01-17 DIAGNOSIS — M1990 Unspecified osteoarthritis, unspecified site: Secondary | ICD-10-CM | POA: Diagnosis not present

## 2019-01-17 DIAGNOSIS — L89312 Pressure ulcer of right buttock, stage 2: Secondary | ICD-10-CM | POA: Diagnosis not present

## 2019-01-17 DIAGNOSIS — I2699 Other pulmonary embolism without acute cor pulmonale: Secondary | ICD-10-CM | POA: Diagnosis not present

## 2019-01-17 DIAGNOSIS — I129 Hypertensive chronic kidney disease with stage 1 through stage 4 chronic kidney disease, or unspecified chronic kidney disease: Secondary | ICD-10-CM | POA: Diagnosis not present

## 2019-01-17 DIAGNOSIS — N182 Chronic kidney disease, stage 2 (mild): Secondary | ICD-10-CM | POA: Diagnosis not present

## 2019-02-28 DIAGNOSIS — I10 Essential (primary) hypertension: Secondary | ICD-10-CM | POA: Diagnosis not present

## 2019-02-28 DIAGNOSIS — E039 Hypothyroidism, unspecified: Secondary | ICD-10-CM | POA: Diagnosis not present

## 2019-02-28 DIAGNOSIS — K219 Gastro-esophageal reflux disease without esophagitis: Secondary | ICD-10-CM | POA: Diagnosis not present

## 2019-02-28 DIAGNOSIS — M159 Polyosteoarthritis, unspecified: Secondary | ICD-10-CM | POA: Diagnosis not present

## 2019-03-03 DIAGNOSIS — Z79899 Other long term (current) drug therapy: Secondary | ICD-10-CM | POA: Diagnosis not present

## 2019-03-07 DIAGNOSIS — K219 Gastro-esophageal reflux disease without esophagitis: Secondary | ICD-10-CM | POA: Diagnosis not present

## 2019-03-07 DIAGNOSIS — E538 Deficiency of other specified B group vitamins: Secondary | ICD-10-CM | POA: Diagnosis not present

## 2019-03-07 DIAGNOSIS — I10 Essential (primary) hypertension: Secondary | ICD-10-CM | POA: Diagnosis not present

## 2019-03-07 DIAGNOSIS — E039 Hypothyroidism, unspecified: Secondary | ICD-10-CM | POA: Diagnosis not present

## 2019-03-22 DIAGNOSIS — B351 Tinea unguium: Secondary | ICD-10-CM | POA: Diagnosis not present

## 2019-03-22 DIAGNOSIS — R600 Localized edema: Secondary | ICD-10-CM | POA: Diagnosis not present

## 2019-03-22 DIAGNOSIS — I739 Peripheral vascular disease, unspecified: Secondary | ICD-10-CM | POA: Diagnosis not present

## 2019-04-04 DIAGNOSIS — I10 Essential (primary) hypertension: Secondary | ICD-10-CM | POA: Diagnosis not present

## 2019-04-04 DIAGNOSIS — E538 Deficiency of other specified B group vitamins: Secondary | ICD-10-CM | POA: Diagnosis not present

## 2019-04-04 DIAGNOSIS — K219 Gastro-esophageal reflux disease without esophagitis: Secondary | ICD-10-CM | POA: Diagnosis not present

## 2019-04-04 DIAGNOSIS — E039 Hypothyroidism, unspecified: Secondary | ICD-10-CM | POA: Diagnosis not present

## 2019-04-07 DIAGNOSIS — Z79899 Other long term (current) drug therapy: Secondary | ICD-10-CM | POA: Diagnosis not present

## 2019-04-13 DIAGNOSIS — I10 Essential (primary) hypertension: Secondary | ICD-10-CM | POA: Diagnosis not present

## 2019-04-13 DIAGNOSIS — L89151 Pressure ulcer of sacral region, stage 1: Secondary | ICD-10-CM | POA: Diagnosis not present

## 2019-04-13 DIAGNOSIS — E039 Hypothyroidism, unspecified: Secondary | ICD-10-CM | POA: Diagnosis not present

## 2019-04-13 DIAGNOSIS — K219 Gastro-esophageal reflux disease without esophagitis: Secondary | ICD-10-CM | POA: Diagnosis not present

## 2019-04-15 DIAGNOSIS — E039 Hypothyroidism, unspecified: Secondary | ICD-10-CM | POA: Diagnosis not present

## 2019-04-15 DIAGNOSIS — L89312 Pressure ulcer of right buttock, stage 2: Secondary | ICD-10-CM | POA: Diagnosis not present

## 2019-04-15 DIAGNOSIS — I129 Hypertensive chronic kidney disease with stage 1 through stage 4 chronic kidney disease, or unspecified chronic kidney disease: Secondary | ICD-10-CM | POA: Diagnosis not present

## 2019-04-15 DIAGNOSIS — L89322 Pressure ulcer of left buttock, stage 2: Secondary | ICD-10-CM | POA: Diagnosis not present

## 2019-04-15 DIAGNOSIS — Z4800 Encounter for change or removal of nonsurgical wound dressing: Secondary | ICD-10-CM | POA: Diagnosis not present

## 2019-04-15 DIAGNOSIS — M109 Gout, unspecified: Secondary | ICD-10-CM | POA: Diagnosis not present

## 2019-04-15 DIAGNOSIS — K219 Gastro-esophageal reflux disease without esophagitis: Secondary | ICD-10-CM | POA: Diagnosis not present

## 2019-04-15 DIAGNOSIS — N182 Chronic kidney disease, stage 2 (mild): Secondary | ICD-10-CM | POA: Diagnosis not present

## 2019-04-15 DIAGNOSIS — E538 Deficiency of other specified B group vitamins: Secondary | ICD-10-CM | POA: Diagnosis not present

## 2019-05-18 DIAGNOSIS — R112 Nausea with vomiting, unspecified: Secondary | ICD-10-CM | POA: Diagnosis not present

## 2019-05-18 DIAGNOSIS — E039 Hypothyroidism, unspecified: Secondary | ICD-10-CM | POA: Diagnosis not present

## 2019-05-18 DIAGNOSIS — Z79899 Other long term (current) drug therapy: Secondary | ICD-10-CM | POA: Diagnosis not present

## 2019-05-18 DIAGNOSIS — I10 Essential (primary) hypertension: Secondary | ICD-10-CM | POA: Diagnosis not present

## 2019-05-18 DIAGNOSIS — K219 Gastro-esophageal reflux disease without esophagitis: Secondary | ICD-10-CM | POA: Diagnosis not present

## 2019-05-26 DIAGNOSIS — M25562 Pain in left knee: Secondary | ICD-10-CM | POA: Diagnosis not present

## 2019-05-26 DIAGNOSIS — M1610 Unilateral primary osteoarthritis, unspecified hip: Secondary | ICD-10-CM | POA: Diagnosis not present

## 2019-05-26 DIAGNOSIS — M25552 Pain in left hip: Secondary | ICD-10-CM | POA: Diagnosis not present

## 2019-05-26 DIAGNOSIS — M545 Low back pain: Secondary | ICD-10-CM | POA: Diagnosis not present

## 2019-05-31 DIAGNOSIS — E559 Vitamin D deficiency, unspecified: Secondary | ICD-10-CM | POA: Diagnosis not present

## 2019-05-31 DIAGNOSIS — R209 Unspecified disturbances of skin sensation: Secondary | ICD-10-CM | POA: Diagnosis not present

## 2019-05-31 DIAGNOSIS — B351 Tinea unguium: Secondary | ICD-10-CM | POA: Diagnosis not present

## 2019-05-31 DIAGNOSIS — I739 Peripheral vascular disease, unspecified: Secondary | ICD-10-CM | POA: Diagnosis not present

## 2019-06-27 DIAGNOSIS — M25552 Pain in left hip: Secondary | ICD-10-CM | POA: Diagnosis not present

## 2019-06-27 DIAGNOSIS — Z90710 Acquired absence of both cervix and uterus: Secondary | ICD-10-CM | POA: Diagnosis not present

## 2019-06-27 DIAGNOSIS — Z9049 Acquired absence of other specified parts of digestive tract: Secondary | ICD-10-CM | POA: Diagnosis not present

## 2019-06-27 DIAGNOSIS — M1612 Unilateral primary osteoarthritis, left hip: Secondary | ICD-10-CM | POA: Diagnosis not present

## 2019-07-07 DIAGNOSIS — I10 Essential (primary) hypertension: Secondary | ICD-10-CM | POA: Diagnosis not present

## 2019-07-07 DIAGNOSIS — Z79899 Other long term (current) drug therapy: Secondary | ICD-10-CM | POA: Diagnosis not present

## 2019-07-27 DIAGNOSIS — M1610 Unilateral primary osteoarthritis, unspecified hip: Secondary | ICD-10-CM | POA: Diagnosis not present

## 2019-07-27 DIAGNOSIS — M1710 Unilateral primary osteoarthritis, unspecified knee: Secondary | ICD-10-CM | POA: Diagnosis not present

## 2019-07-27 DIAGNOSIS — M25562 Pain in left knee: Secondary | ICD-10-CM | POA: Diagnosis not present

## 2019-08-11 DIAGNOSIS — R208 Other disturbances of skin sensation: Secondary | ICD-10-CM | POA: Diagnosis not present

## 2019-08-11 DIAGNOSIS — M79673 Pain in unspecified foot: Secondary | ICD-10-CM | POA: Diagnosis not present

## 2019-08-11 DIAGNOSIS — I739 Peripheral vascular disease, unspecified: Secondary | ICD-10-CM | POA: Diagnosis not present

## 2019-08-11 DIAGNOSIS — B351 Tinea unguium: Secondary | ICD-10-CM | POA: Diagnosis not present

## 2019-08-18 DIAGNOSIS — M25562 Pain in left knee: Secondary | ICD-10-CM | POA: Diagnosis not present

## 2019-08-18 DIAGNOSIS — Z79891 Long term (current) use of opiate analgesic: Secondary | ICD-10-CM | POA: Diagnosis not present

## 2019-08-18 DIAGNOSIS — Z5181 Encounter for therapeutic drug level monitoring: Secondary | ICD-10-CM | POA: Diagnosis not present

## 2019-08-18 DIAGNOSIS — M2559 Pain in other specified joint: Secondary | ICD-10-CM | POA: Diagnosis not present

## 2019-08-18 DIAGNOSIS — M25559 Pain in unspecified hip: Secondary | ICD-10-CM | POA: Diagnosis not present

## 2019-08-18 DIAGNOSIS — M545 Low back pain: Secondary | ICD-10-CM | POA: Diagnosis not present

## 2019-08-18 DIAGNOSIS — M1610 Unilateral primary osteoarthritis, unspecified hip: Secondary | ICD-10-CM | POA: Diagnosis not present

## 2019-08-18 DIAGNOSIS — G894 Chronic pain syndrome: Secondary | ICD-10-CM | POA: Diagnosis not present

## 2019-08-18 DIAGNOSIS — M1710 Unilateral primary osteoarthritis, unspecified knee: Secondary | ICD-10-CM | POA: Diagnosis not present

## 2019-08-18 DIAGNOSIS — G8929 Other chronic pain: Secondary | ICD-10-CM | POA: Diagnosis not present

## 2019-10-23 DIAGNOSIS — L84 Corns and callosities: Secondary | ICD-10-CM | POA: Diagnosis not present

## 2019-10-23 DIAGNOSIS — B351 Tinea unguium: Secondary | ICD-10-CM | POA: Diagnosis not present

## 2019-10-23 DIAGNOSIS — R269 Unspecified abnormalities of gait and mobility: Secondary | ICD-10-CM | POA: Diagnosis not present

## 2019-10-23 DIAGNOSIS — I739 Peripheral vascular disease, unspecified: Secondary | ICD-10-CM | POA: Diagnosis not present

## 2019-11-08 DIAGNOSIS — M1712 Unilateral primary osteoarthritis, left knee: Secondary | ICD-10-CM | POA: Diagnosis not present

## 2019-11-08 DIAGNOSIS — M25559 Pain in unspecified hip: Secondary | ICD-10-CM | POA: Diagnosis not present

## 2019-11-08 DIAGNOSIS — M545 Low back pain: Secondary | ICD-10-CM | POA: Diagnosis not present

## 2019-11-08 DIAGNOSIS — M1612 Unilateral primary osteoarthritis, left hip: Secondary | ICD-10-CM | POA: Diagnosis not present

## 2019-11-08 DIAGNOSIS — Z5181 Encounter for therapeutic drug level monitoring: Secondary | ICD-10-CM | POA: Diagnosis not present

## 2019-11-08 DIAGNOSIS — Z79891 Long term (current) use of opiate analgesic: Secondary | ICD-10-CM | POA: Diagnosis not present

## 2019-11-08 DIAGNOSIS — G8929 Other chronic pain: Secondary | ICD-10-CM | POA: Diagnosis not present

## 2019-11-08 DIAGNOSIS — M4186 Other forms of scoliosis, lumbar region: Secondary | ICD-10-CM | POA: Diagnosis not present

## 2019-11-08 DIAGNOSIS — E663 Overweight: Secondary | ICD-10-CM | POA: Diagnosis not present

## 2019-11-08 DIAGNOSIS — G894 Chronic pain syndrome: Secondary | ICD-10-CM | POA: Diagnosis not present

## 2019-11-08 DIAGNOSIS — M47816 Spondylosis without myelopathy or radiculopathy, lumbar region: Secondary | ICD-10-CM | POA: Diagnosis not present

## 2019-11-08 DIAGNOSIS — Z6833 Body mass index (BMI) 33.0-33.9, adult: Secondary | ICD-10-CM | POA: Diagnosis not present

## 2019-11-10 DIAGNOSIS — Z79899 Other long term (current) drug therapy: Secondary | ICD-10-CM | POA: Diagnosis not present

## 2019-11-10 DIAGNOSIS — E039 Hypothyroidism, unspecified: Secondary | ICD-10-CM | POA: Diagnosis not present

## 2019-11-10 DIAGNOSIS — I10 Essential (primary) hypertension: Secondary | ICD-10-CM | POA: Diagnosis not present

## 2019-11-16 DIAGNOSIS — I10 Essential (primary) hypertension: Secondary | ICD-10-CM | POA: Diagnosis not present

## 2019-11-16 DIAGNOSIS — K219 Gastro-esophageal reflux disease without esophagitis: Secondary | ICD-10-CM | POA: Diagnosis not present

## 2019-11-16 DIAGNOSIS — M159 Polyosteoarthritis, unspecified: Secondary | ICD-10-CM | POA: Diagnosis not present

## 2019-11-16 DIAGNOSIS — E039 Hypothyroidism, unspecified: Secondary | ICD-10-CM | POA: Diagnosis not present

## 2019-12-05 DIAGNOSIS — M159 Polyosteoarthritis, unspecified: Secondary | ICD-10-CM | POA: Diagnosis not present

## 2019-12-05 DIAGNOSIS — R5381 Other malaise: Secondary | ICD-10-CM | POA: Diagnosis not present

## 2019-12-14 DIAGNOSIS — K219 Gastro-esophageal reflux disease without esophagitis: Secondary | ICD-10-CM | POA: Diagnosis not present

## 2019-12-14 DIAGNOSIS — E039 Hypothyroidism, unspecified: Secondary | ICD-10-CM | POA: Diagnosis not present

## 2019-12-14 DIAGNOSIS — I10 Essential (primary) hypertension: Secondary | ICD-10-CM | POA: Diagnosis not present

## 2019-12-14 DIAGNOSIS — M159 Polyosteoarthritis, unspecified: Secondary | ICD-10-CM | POA: Diagnosis not present

## 2019-12-15 DIAGNOSIS — N182 Chronic kidney disease, stage 2 (mild): Secondary | ICD-10-CM | POA: Diagnosis not present

## 2019-12-15 DIAGNOSIS — Z86711 Personal history of pulmonary embolism: Secondary | ICD-10-CM | POA: Diagnosis not present

## 2019-12-15 DIAGNOSIS — Z9181 History of falling: Secondary | ICD-10-CM | POA: Diagnosis not present

## 2019-12-15 DIAGNOSIS — G629 Polyneuropathy, unspecified: Secondary | ICD-10-CM | POA: Diagnosis not present

## 2019-12-15 DIAGNOSIS — M159 Polyosteoarthritis, unspecified: Secondary | ICD-10-CM | POA: Diagnosis not present

## 2019-12-15 DIAGNOSIS — G8929 Other chronic pain: Secondary | ICD-10-CM | POA: Diagnosis not present

## 2019-12-15 DIAGNOSIS — I129 Hypertensive chronic kidney disease with stage 1 through stage 4 chronic kidney disease, or unspecified chronic kidney disease: Secondary | ICD-10-CM | POA: Diagnosis not present

## 2019-12-15 DIAGNOSIS — K219 Gastro-esophageal reflux disease without esophagitis: Secondary | ICD-10-CM | POA: Diagnosis not present

## 2019-12-15 DIAGNOSIS — M1030 Gout due to renal impairment, unspecified site: Secondary | ICD-10-CM | POA: Diagnosis not present

## 2019-12-15 DIAGNOSIS — E039 Hypothyroidism, unspecified: Secondary | ICD-10-CM | POA: Diagnosis not present

## 2019-12-15 DIAGNOSIS — G2581 Restless legs syndrome: Secondary | ICD-10-CM | POA: Diagnosis not present

## 2019-12-21 DIAGNOSIS — I10 Essential (primary) hypertension: Secondary | ICD-10-CM | POA: Diagnosis not present

## 2019-12-21 DIAGNOSIS — M159 Polyosteoarthritis, unspecified: Secondary | ICD-10-CM | POA: Diagnosis not present

## 2019-12-21 DIAGNOSIS — E039 Hypothyroidism, unspecified: Secondary | ICD-10-CM | POA: Diagnosis not present

## 2019-12-21 DIAGNOSIS — K219 Gastro-esophageal reflux disease without esophagitis: Secondary | ICD-10-CM | POA: Diagnosis not present

## 2019-12-22 DIAGNOSIS — B351 Tinea unguium: Secondary | ICD-10-CM | POA: Diagnosis not present

## 2019-12-22 DIAGNOSIS — R269 Unspecified abnormalities of gait and mobility: Secondary | ICD-10-CM | POA: Diagnosis not present

## 2019-12-22 DIAGNOSIS — I739 Peripheral vascular disease, unspecified: Secondary | ICD-10-CM | POA: Diagnosis not present

## 2019-12-22 DIAGNOSIS — L84 Corns and callosities: Secondary | ICD-10-CM | POA: Diagnosis not present

## 2020-01-15 DIAGNOSIS — E039 Hypothyroidism, unspecified: Secondary | ICD-10-CM | POA: Diagnosis not present

## 2020-01-15 DIAGNOSIS — M159 Polyosteoarthritis, unspecified: Secondary | ICD-10-CM | POA: Diagnosis not present

## 2020-01-15 DIAGNOSIS — Z86711 Personal history of pulmonary embolism: Secondary | ICD-10-CM | POA: Diagnosis not present

## 2020-01-15 DIAGNOSIS — K219 Gastro-esophageal reflux disease without esophagitis: Secondary | ICD-10-CM | POA: Diagnosis not present

## 2020-01-15 DIAGNOSIS — G2581 Restless legs syndrome: Secondary | ICD-10-CM | POA: Diagnosis not present

## 2020-01-15 DIAGNOSIS — I129 Hypertensive chronic kidney disease with stage 1 through stage 4 chronic kidney disease, or unspecified chronic kidney disease: Secondary | ICD-10-CM | POA: Diagnosis not present

## 2020-01-15 DIAGNOSIS — G629 Polyneuropathy, unspecified: Secondary | ICD-10-CM | POA: Diagnosis not present

## 2020-01-15 DIAGNOSIS — G8929 Other chronic pain: Secondary | ICD-10-CM | POA: Diagnosis not present

## 2020-01-15 DIAGNOSIS — N182 Chronic kidney disease, stage 2 (mild): Secondary | ICD-10-CM | POA: Diagnosis not present

## 2020-01-15 DIAGNOSIS — M1030 Gout due to renal impairment, unspecified site: Secondary | ICD-10-CM | POA: Diagnosis not present

## 2020-01-15 DIAGNOSIS — Z9181 History of falling: Secondary | ICD-10-CM | POA: Diagnosis not present

## 2020-01-30 DIAGNOSIS — I10 Essential (primary) hypertension: Secondary | ICD-10-CM | POA: Diagnosis not present

## 2020-01-30 DIAGNOSIS — R269 Unspecified abnormalities of gait and mobility: Secondary | ICD-10-CM | POA: Diagnosis not present

## 2020-01-30 DIAGNOSIS — E039 Hypothyroidism, unspecified: Secondary | ICD-10-CM | POA: Diagnosis not present

## 2020-01-30 DIAGNOSIS — K219 Gastro-esophageal reflux disease without esophagitis: Secondary | ICD-10-CM | POA: Diagnosis not present

## 2020-02-01 DIAGNOSIS — M159 Polyosteoarthritis, unspecified: Secondary | ICD-10-CM | POA: Diagnosis not present

## 2020-02-01 DIAGNOSIS — E039 Hypothyroidism, unspecified: Secondary | ICD-10-CM | POA: Diagnosis not present

## 2020-02-01 DIAGNOSIS — K219 Gastro-esophageal reflux disease without esophagitis: Secondary | ICD-10-CM | POA: Diagnosis not present

## 2020-02-01 DIAGNOSIS — I10 Essential (primary) hypertension: Secondary | ICD-10-CM | POA: Diagnosis not present

## 2020-02-06 DIAGNOSIS — K219 Gastro-esophageal reflux disease without esophagitis: Secondary | ICD-10-CM | POA: Diagnosis not present

## 2020-02-06 DIAGNOSIS — M159 Polyosteoarthritis, unspecified: Secondary | ICD-10-CM | POA: Diagnosis not present

## 2020-02-06 DIAGNOSIS — E039 Hypothyroidism, unspecified: Secondary | ICD-10-CM | POA: Diagnosis not present

## 2020-02-06 DIAGNOSIS — I10 Essential (primary) hypertension: Secondary | ICD-10-CM | POA: Diagnosis not present

## 2020-02-21 DIAGNOSIS — R269 Unspecified abnormalities of gait and mobility: Secondary | ICD-10-CM | POA: Diagnosis not present

## 2020-02-21 DIAGNOSIS — I739 Peripheral vascular disease, unspecified: Secondary | ICD-10-CM | POA: Diagnosis not present

## 2020-02-21 DIAGNOSIS — L84 Corns and callosities: Secondary | ICD-10-CM | POA: Diagnosis not present

## 2020-02-21 DIAGNOSIS — B351 Tinea unguium: Secondary | ICD-10-CM | POA: Diagnosis not present

## 2020-02-23 DIAGNOSIS — G894 Chronic pain syndrome: Secondary | ICD-10-CM | POA: Diagnosis not present

## 2020-02-23 DIAGNOSIS — M545 Low back pain: Secondary | ICD-10-CM | POA: Diagnosis not present

## 2020-02-23 DIAGNOSIS — M1710 Unilateral primary osteoarthritis, unspecified knee: Secondary | ICD-10-CM | POA: Diagnosis not present

## 2020-02-23 DIAGNOSIS — M1712 Unilateral primary osteoarthritis, left knee: Secondary | ICD-10-CM | POA: Diagnosis not present

## 2020-02-23 DIAGNOSIS — M1612 Unilateral primary osteoarthritis, left hip: Secondary | ICD-10-CM | POA: Diagnosis not present

## 2020-02-23 DIAGNOSIS — M47816 Spondylosis without myelopathy or radiculopathy, lumbar region: Secondary | ICD-10-CM | POA: Diagnosis not present

## 2020-02-23 DIAGNOSIS — M1610 Unilateral primary osteoarthritis, unspecified hip: Secondary | ICD-10-CM | POA: Diagnosis not present

## 2020-02-29 DIAGNOSIS — E538 Deficiency of other specified B group vitamins: Secondary | ICD-10-CM | POA: Diagnosis not present

## 2020-02-29 DIAGNOSIS — I10 Essential (primary) hypertension: Secondary | ICD-10-CM | POA: Diagnosis not present

## 2020-02-29 DIAGNOSIS — Z79899 Other long term (current) drug therapy: Secondary | ICD-10-CM | POA: Diagnosis not present

## 2020-03-05 DIAGNOSIS — I10 Essential (primary) hypertension: Secondary | ICD-10-CM | POA: Diagnosis not present

## 2020-03-05 DIAGNOSIS — M159 Polyosteoarthritis, unspecified: Secondary | ICD-10-CM | POA: Diagnosis not present

## 2020-03-05 DIAGNOSIS — K219 Gastro-esophageal reflux disease without esophagitis: Secondary | ICD-10-CM | POA: Diagnosis not present

## 2020-03-05 DIAGNOSIS — E039 Hypothyroidism, unspecified: Secondary | ICD-10-CM | POA: Diagnosis not present

## 2020-03-19 DIAGNOSIS — R269 Unspecified abnormalities of gait and mobility: Secondary | ICD-10-CM | POA: Diagnosis not present

## 2020-03-19 DIAGNOSIS — M159 Polyosteoarthritis, unspecified: Secondary | ICD-10-CM | POA: Diagnosis not present

## 2020-03-19 DIAGNOSIS — M25569 Pain in unspecified knee: Secondary | ICD-10-CM | POA: Diagnosis not present

## 2020-03-19 DIAGNOSIS — I10 Essential (primary) hypertension: Secondary | ICD-10-CM | POA: Diagnosis not present

## 2020-03-21 DIAGNOSIS — E039 Hypothyroidism, unspecified: Secondary | ICD-10-CM | POA: Diagnosis not present

## 2020-03-21 DIAGNOSIS — L03116 Cellulitis of left lower limb: Secondary | ICD-10-CM | POA: Diagnosis not present

## 2020-03-21 DIAGNOSIS — K219 Gastro-esophageal reflux disease without esophagitis: Secondary | ICD-10-CM | POA: Diagnosis not present

## 2020-03-21 DIAGNOSIS — M159 Polyosteoarthritis, unspecified: Secondary | ICD-10-CM | POA: Diagnosis not present

## 2020-03-26 DIAGNOSIS — K219 Gastro-esophageal reflux disease without esophagitis: Secondary | ICD-10-CM | POA: Diagnosis not present

## 2020-03-26 DIAGNOSIS — L03116 Cellulitis of left lower limb: Secondary | ICD-10-CM | POA: Diagnosis not present

## 2020-03-26 DIAGNOSIS — R609 Edema, unspecified: Secondary | ICD-10-CM | POA: Diagnosis not present

## 2020-03-26 DIAGNOSIS — R269 Unspecified abnormalities of gait and mobility: Secondary | ICD-10-CM | POA: Diagnosis not present

## 2020-04-02 DIAGNOSIS — K219 Gastro-esophageal reflux disease without esophagitis: Secondary | ICD-10-CM | POA: Diagnosis not present

## 2020-04-02 DIAGNOSIS — R609 Edema, unspecified: Secondary | ICD-10-CM | POA: Diagnosis not present

## 2020-04-02 DIAGNOSIS — L03116 Cellulitis of left lower limb: Secondary | ICD-10-CM | POA: Diagnosis not present

## 2020-04-02 DIAGNOSIS — I10 Essential (primary) hypertension: Secondary | ICD-10-CM | POA: Diagnosis not present

## 2020-04-05 DIAGNOSIS — I10 Essential (primary) hypertension: Secondary | ICD-10-CM | POA: Diagnosis not present

## 2020-04-05 DIAGNOSIS — M159 Polyosteoarthritis, unspecified: Secondary | ICD-10-CM | POA: Diagnosis not present

## 2020-04-05 DIAGNOSIS — E039 Hypothyroidism, unspecified: Secondary | ICD-10-CM | POA: Diagnosis not present

## 2020-04-05 DIAGNOSIS — K219 Gastro-esophageal reflux disease without esophagitis: Secondary | ICD-10-CM | POA: Diagnosis not present

## 2020-04-11 DIAGNOSIS — E039 Hypothyroidism, unspecified: Secondary | ICD-10-CM | POA: Diagnosis not present

## 2020-04-11 DIAGNOSIS — M159 Polyosteoarthritis, unspecified: Secondary | ICD-10-CM | POA: Diagnosis not present

## 2020-04-11 DIAGNOSIS — K219 Gastro-esophageal reflux disease without esophagitis: Secondary | ICD-10-CM | POA: Diagnosis not present

## 2020-04-11 DIAGNOSIS — I10 Essential (primary) hypertension: Secondary | ICD-10-CM | POA: Diagnosis not present

## 2020-04-15 DIAGNOSIS — K219 Gastro-esophageal reflux disease without esophagitis: Secondary | ICD-10-CM | POA: Diagnosis not present

## 2020-04-15 DIAGNOSIS — L89311 Pressure ulcer of right buttock, stage 1: Secondary | ICD-10-CM | POA: Diagnosis not present

## 2020-04-15 DIAGNOSIS — G629 Polyneuropathy, unspecified: Secondary | ICD-10-CM | POA: Diagnosis not present

## 2020-04-15 DIAGNOSIS — E039 Hypothyroidism, unspecified: Secondary | ICD-10-CM | POA: Diagnosis not present

## 2020-04-15 DIAGNOSIS — N182 Chronic kidney disease, stage 2 (mild): Secondary | ICD-10-CM | POA: Diagnosis not present

## 2020-04-15 DIAGNOSIS — I129 Hypertensive chronic kidney disease with stage 1 through stage 4 chronic kidney disease, or unspecified chronic kidney disease: Secondary | ICD-10-CM | POA: Diagnosis not present

## 2020-04-15 DIAGNOSIS — L89321 Pressure ulcer of left buttock, stage 1: Secondary | ICD-10-CM | POA: Diagnosis not present

## 2020-04-15 DIAGNOSIS — M159 Polyosteoarthritis, unspecified: Secondary | ICD-10-CM | POA: Diagnosis not present

## 2020-04-15 DIAGNOSIS — Z86711 Personal history of pulmonary embolism: Secondary | ICD-10-CM | POA: Diagnosis not present

## 2020-04-15 DIAGNOSIS — Z9181 History of falling: Secondary | ICD-10-CM | POA: Diagnosis not present

## 2020-04-15 DIAGNOSIS — L89322 Pressure ulcer of left buttock, stage 2: Secondary | ICD-10-CM | POA: Diagnosis not present

## 2020-04-15 DIAGNOSIS — G2581 Restless legs syndrome: Secondary | ICD-10-CM | POA: Diagnosis not present

## 2020-04-15 DIAGNOSIS — M1030 Gout due to renal impairment, unspecified site: Secondary | ICD-10-CM | POA: Diagnosis not present

## 2020-04-18 DIAGNOSIS — E039 Hypothyroidism, unspecified: Secondary | ICD-10-CM | POA: Diagnosis not present

## 2020-04-18 DIAGNOSIS — M159 Polyosteoarthritis, unspecified: Secondary | ICD-10-CM | POA: Diagnosis not present

## 2020-04-18 DIAGNOSIS — K219 Gastro-esophageal reflux disease without esophagitis: Secondary | ICD-10-CM | POA: Diagnosis not present

## 2020-04-18 DIAGNOSIS — I10 Essential (primary) hypertension: Secondary | ICD-10-CM | POA: Diagnosis not present

## 2020-04-23 DIAGNOSIS — B379 Candidiasis, unspecified: Secondary | ICD-10-CM | POA: Diagnosis not present

## 2020-04-23 DIAGNOSIS — L98429 Non-pressure chronic ulcer of back with unspecified severity: Secondary | ICD-10-CM | POA: Diagnosis not present

## 2020-04-23 DIAGNOSIS — M159 Polyosteoarthritis, unspecified: Secondary | ICD-10-CM | POA: Diagnosis not present

## 2020-04-23 DIAGNOSIS — R609 Edema, unspecified: Secondary | ICD-10-CM | POA: Diagnosis not present

## 2020-04-24 DIAGNOSIS — I739 Peripheral vascular disease, unspecified: Secondary | ICD-10-CM | POA: Diagnosis not present

## 2020-04-24 DIAGNOSIS — L84 Corns and callosities: Secondary | ICD-10-CM | POA: Diagnosis not present

## 2020-04-24 DIAGNOSIS — B351 Tinea unguium: Secondary | ICD-10-CM | POA: Diagnosis not present

## 2020-04-24 DIAGNOSIS — R269 Unspecified abnormalities of gait and mobility: Secondary | ICD-10-CM | POA: Diagnosis not present

## 2020-05-09 DIAGNOSIS — I10 Essential (primary) hypertension: Secondary | ICD-10-CM | POA: Diagnosis not present

## 2020-05-09 DIAGNOSIS — K219 Gastro-esophageal reflux disease without esophagitis: Secondary | ICD-10-CM | POA: Diagnosis not present

## 2020-05-09 DIAGNOSIS — E039 Hypothyroidism, unspecified: Secondary | ICD-10-CM | POA: Diagnosis not present

## 2020-05-09 DIAGNOSIS — M159 Polyosteoarthritis, unspecified: Secondary | ICD-10-CM | POA: Diagnosis not present

## 2020-05-16 DIAGNOSIS — Z9181 History of falling: Secondary | ICD-10-CM | POA: Diagnosis not present

## 2020-05-16 DIAGNOSIS — K219 Gastro-esophageal reflux disease without esophagitis: Secondary | ICD-10-CM | POA: Diagnosis not present

## 2020-05-16 DIAGNOSIS — M159 Polyosteoarthritis, unspecified: Secondary | ICD-10-CM | POA: Diagnosis not present

## 2020-05-16 DIAGNOSIS — G629 Polyneuropathy, unspecified: Secondary | ICD-10-CM | POA: Diagnosis not present

## 2020-05-16 DIAGNOSIS — I129 Hypertensive chronic kidney disease with stage 1 through stage 4 chronic kidney disease, or unspecified chronic kidney disease: Secondary | ICD-10-CM | POA: Diagnosis not present

## 2020-05-16 DIAGNOSIS — E039 Hypothyroidism, unspecified: Secondary | ICD-10-CM | POA: Diagnosis not present

## 2020-05-16 DIAGNOSIS — N182 Chronic kidney disease, stage 2 (mild): Secondary | ICD-10-CM | POA: Diagnosis not present

## 2020-05-16 DIAGNOSIS — M1030 Gout due to renal impairment, unspecified site: Secondary | ICD-10-CM | POA: Diagnosis not present

## 2020-05-16 DIAGNOSIS — L89311 Pressure ulcer of right buttock, stage 1: Secondary | ICD-10-CM | POA: Diagnosis not present

## 2020-05-16 DIAGNOSIS — G2581 Restless legs syndrome: Secondary | ICD-10-CM | POA: Diagnosis not present

## 2020-05-16 DIAGNOSIS — L89321 Pressure ulcer of left buttock, stage 1: Secondary | ICD-10-CM | POA: Diagnosis not present

## 2020-05-16 DIAGNOSIS — Z86711 Personal history of pulmonary embolism: Secondary | ICD-10-CM | POA: Diagnosis not present

## 2020-05-16 DIAGNOSIS — L89322 Pressure ulcer of left buttock, stage 2: Secondary | ICD-10-CM | POA: Diagnosis not present

## 2020-05-17 DIAGNOSIS — M19012 Primary osteoarthritis, left shoulder: Secondary | ICD-10-CM | POA: Diagnosis not present

## 2020-05-17 DIAGNOSIS — M19011 Primary osteoarthritis, right shoulder: Secondary | ICD-10-CM | POA: Diagnosis not present

## 2020-05-17 DIAGNOSIS — M170 Bilateral primary osteoarthritis of knee: Secondary | ICD-10-CM | POA: Diagnosis not present

## 2020-05-17 DIAGNOSIS — M160 Bilateral primary osteoarthritis of hip: Secondary | ICD-10-CM | POA: Diagnosis not present

## 2020-06-06 DIAGNOSIS — K219 Gastro-esophageal reflux disease without esophagitis: Secondary | ICD-10-CM | POA: Diagnosis not present

## 2020-06-06 DIAGNOSIS — E039 Hypothyroidism, unspecified: Secondary | ICD-10-CM | POA: Diagnosis not present

## 2020-06-06 DIAGNOSIS — M159 Polyosteoarthritis, unspecified: Secondary | ICD-10-CM | POA: Diagnosis not present

## 2020-06-06 DIAGNOSIS — Z79899 Other long term (current) drug therapy: Secondary | ICD-10-CM | POA: Diagnosis not present

## 2020-06-26 DIAGNOSIS — B351 Tinea unguium: Secondary | ICD-10-CM | POA: Diagnosis not present

## 2020-06-26 DIAGNOSIS — R269 Unspecified abnormalities of gait and mobility: Secondary | ICD-10-CM | POA: Diagnosis not present

## 2020-06-26 DIAGNOSIS — I739 Peripheral vascular disease, unspecified: Secondary | ICD-10-CM | POA: Diagnosis not present

## 2020-06-26 DIAGNOSIS — L84 Corns and callosities: Secondary | ICD-10-CM | POA: Diagnosis not present

## 2020-06-28 DIAGNOSIS — Z79899 Other long term (current) drug therapy: Secondary | ICD-10-CM | POA: Diagnosis not present

## 2020-07-04 DIAGNOSIS — E039 Hypothyroidism, unspecified: Secondary | ICD-10-CM | POA: Diagnosis not present

## 2020-07-04 DIAGNOSIS — K219 Gastro-esophageal reflux disease without esophagitis: Secondary | ICD-10-CM | POA: Diagnosis not present

## 2020-07-04 DIAGNOSIS — M159 Polyosteoarthritis, unspecified: Secondary | ICD-10-CM | POA: Diagnosis not present

## 2020-07-04 DIAGNOSIS — Z79899 Other long term (current) drug therapy: Secondary | ICD-10-CM | POA: Diagnosis not present

## 2020-07-16 DIAGNOSIS — L98429 Non-pressure chronic ulcer of back with unspecified severity: Secondary | ICD-10-CM | POA: Diagnosis not present

## 2020-07-16 DIAGNOSIS — G629 Polyneuropathy, unspecified: Secondary | ICD-10-CM | POA: Diagnosis not present

## 2020-07-16 DIAGNOSIS — R269 Unspecified abnormalities of gait and mobility: Secondary | ICD-10-CM | POA: Diagnosis not present

## 2020-07-16 DIAGNOSIS — I10 Essential (primary) hypertension: Secondary | ICD-10-CM | POA: Diagnosis not present

## 2020-07-27 DIAGNOSIS — M1030 Gout due to renal impairment, unspecified site: Secondary | ICD-10-CM | POA: Diagnosis not present

## 2020-07-27 DIAGNOSIS — M150 Primary generalized (osteo)arthritis: Secondary | ICD-10-CM | POA: Diagnosis not present

## 2020-07-27 DIAGNOSIS — N182 Chronic kidney disease, stage 2 (mild): Secondary | ICD-10-CM | POA: Diagnosis not present

## 2020-07-27 DIAGNOSIS — G629 Polyneuropathy, unspecified: Secondary | ICD-10-CM | POA: Diagnosis not present

## 2020-07-27 DIAGNOSIS — G2581 Restless legs syndrome: Secondary | ICD-10-CM | POA: Diagnosis not present

## 2020-07-27 DIAGNOSIS — K219 Gastro-esophageal reflux disease without esophagitis: Secondary | ICD-10-CM | POA: Diagnosis not present

## 2020-07-27 DIAGNOSIS — E039 Hypothyroidism, unspecified: Secondary | ICD-10-CM | POA: Diagnosis not present

## 2020-07-27 DIAGNOSIS — I129 Hypertensive chronic kidney disease with stage 1 through stage 4 chronic kidney disease, or unspecified chronic kidney disease: Secondary | ICD-10-CM | POA: Diagnosis not present

## 2020-07-27 DIAGNOSIS — Z86711 Personal history of pulmonary embolism: Secondary | ICD-10-CM | POA: Diagnosis not present

## 2020-07-27 DIAGNOSIS — L89322 Pressure ulcer of left buttock, stage 2: Secondary | ICD-10-CM | POA: Diagnosis not present

## 2020-07-27 DIAGNOSIS — Z9181 History of falling: Secondary | ICD-10-CM | POA: Diagnosis not present

## 2020-07-30 DIAGNOSIS — R5381 Other malaise: Secondary | ICD-10-CM | POA: Diagnosis not present

## 2020-07-30 DIAGNOSIS — I10 Essential (primary) hypertension: Secondary | ICD-10-CM | POA: Diagnosis not present

## 2020-07-30 DIAGNOSIS — R269 Unspecified abnormalities of gait and mobility: Secondary | ICD-10-CM | POA: Diagnosis not present

## 2020-07-30 DIAGNOSIS — L98429 Non-pressure chronic ulcer of back with unspecified severity: Secondary | ICD-10-CM | POA: Diagnosis not present

## 2020-08-20 DIAGNOSIS — K219 Gastro-esophageal reflux disease without esophagitis: Secondary | ICD-10-CM | POA: Diagnosis not present

## 2020-08-20 DIAGNOSIS — L89322 Pressure ulcer of left buttock, stage 2: Secondary | ICD-10-CM | POA: Diagnosis not present

## 2020-08-20 DIAGNOSIS — E039 Hypothyroidism, unspecified: Secondary | ICD-10-CM | POA: Diagnosis not present

## 2020-08-20 DIAGNOSIS — I10 Essential (primary) hypertension: Secondary | ICD-10-CM | POA: Diagnosis not present

## 2020-08-27 DIAGNOSIS — G629 Polyneuropathy, unspecified: Secondary | ICD-10-CM | POA: Diagnosis not present

## 2020-08-27 DIAGNOSIS — N182 Chronic kidney disease, stage 2 (mild): Secondary | ICD-10-CM | POA: Diagnosis not present

## 2020-08-27 DIAGNOSIS — Z86711 Personal history of pulmonary embolism: Secondary | ICD-10-CM | POA: Diagnosis not present

## 2020-08-27 DIAGNOSIS — M150 Primary generalized (osteo)arthritis: Secondary | ICD-10-CM | POA: Diagnosis not present

## 2020-08-27 DIAGNOSIS — M1030 Gout due to renal impairment, unspecified site: Secondary | ICD-10-CM | POA: Diagnosis not present

## 2020-08-27 DIAGNOSIS — I129 Hypertensive chronic kidney disease with stage 1 through stage 4 chronic kidney disease, or unspecified chronic kidney disease: Secondary | ICD-10-CM | POA: Diagnosis not present

## 2020-08-27 DIAGNOSIS — K219 Gastro-esophageal reflux disease without esophagitis: Secondary | ICD-10-CM | POA: Diagnosis not present

## 2020-08-27 DIAGNOSIS — G2581 Restless legs syndrome: Secondary | ICD-10-CM | POA: Diagnosis not present

## 2020-08-27 DIAGNOSIS — E039 Hypothyroidism, unspecified: Secondary | ICD-10-CM | POA: Diagnosis not present

## 2020-08-27 DIAGNOSIS — L89322 Pressure ulcer of left buttock, stage 2: Secondary | ICD-10-CM | POA: Diagnosis not present

## 2020-08-27 DIAGNOSIS — Z9181 History of falling: Secondary | ICD-10-CM | POA: Diagnosis not present

## 2020-09-17 DIAGNOSIS — E039 Hypothyroidism, unspecified: Secondary | ICD-10-CM | POA: Diagnosis not present

## 2020-09-17 DIAGNOSIS — R609 Edema, unspecified: Secondary | ICD-10-CM | POA: Diagnosis not present

## 2020-09-17 DIAGNOSIS — N390 Urinary tract infection, site not specified: Secondary | ICD-10-CM | POA: Diagnosis not present

## 2020-09-17 DIAGNOSIS — Z79899 Other long term (current) drug therapy: Secondary | ICD-10-CM | POA: Diagnosis not present

## 2020-09-17 DIAGNOSIS — I10 Essential (primary) hypertension: Secondary | ICD-10-CM | POA: Diagnosis not present

## 2020-09-17 DIAGNOSIS — K219 Gastro-esophageal reflux disease without esophagitis: Secondary | ICD-10-CM | POA: Diagnosis not present

## 2020-09-25 DIAGNOSIS — N390 Urinary tract infection, site not specified: Secondary | ICD-10-CM | POA: Diagnosis not present

## 2020-09-25 DIAGNOSIS — K219 Gastro-esophageal reflux disease without esophagitis: Secondary | ICD-10-CM | POA: Diagnosis not present

## 2020-09-25 DIAGNOSIS — Z79899 Other long term (current) drug therapy: Secondary | ICD-10-CM | POA: Diagnosis not present

## 2020-09-25 DIAGNOSIS — M159 Polyosteoarthritis, unspecified: Secondary | ICD-10-CM | POA: Diagnosis not present

## 2020-10-03 DIAGNOSIS — Z79899 Other long term (current) drug therapy: Secondary | ICD-10-CM | POA: Diagnosis not present

## 2020-10-08 DIAGNOSIS — M109 Gout, unspecified: Secondary | ICD-10-CM | POA: Diagnosis not present

## 2020-10-08 DIAGNOSIS — R609 Edema, unspecified: Secondary | ICD-10-CM | POA: Diagnosis not present

## 2020-10-08 DIAGNOSIS — E039 Hypothyroidism, unspecified: Secondary | ICD-10-CM | POA: Diagnosis not present

## 2020-10-08 DIAGNOSIS — I10 Essential (primary) hypertension: Secondary | ICD-10-CM | POA: Diagnosis not present

## 2020-10-16 DIAGNOSIS — E039 Hypothyroidism, unspecified: Secondary | ICD-10-CM | POA: Diagnosis not present

## 2020-10-16 DIAGNOSIS — M1990 Unspecified osteoarthritis, unspecified site: Secondary | ICD-10-CM | POA: Diagnosis not present

## 2020-10-16 DIAGNOSIS — N189 Chronic kidney disease, unspecified: Secondary | ICD-10-CM | POA: Diagnosis not present

## 2020-10-16 DIAGNOSIS — I10 Essential (primary) hypertension: Secondary | ICD-10-CM | POA: Diagnosis not present

## 2020-10-24 DIAGNOSIS — D518 Other vitamin B12 deficiency anemias: Secondary | ICD-10-CM | POA: Diagnosis not present

## 2020-10-24 DIAGNOSIS — E119 Type 2 diabetes mellitus without complications: Secondary | ICD-10-CM | POA: Diagnosis not present

## 2020-10-24 DIAGNOSIS — Z79899 Other long term (current) drug therapy: Secondary | ICD-10-CM | POA: Diagnosis not present

## 2020-10-24 DIAGNOSIS — E7849 Other hyperlipidemia: Secondary | ICD-10-CM | POA: Diagnosis not present

## 2020-11-05 DIAGNOSIS — H524 Presbyopia: Secondary | ICD-10-CM | POA: Diagnosis not present

## 2020-11-06 DIAGNOSIS — M79674 Pain in right toe(s): Secondary | ICD-10-CM | POA: Diagnosis not present

## 2020-11-06 DIAGNOSIS — L600 Ingrowing nail: Secondary | ICD-10-CM | POA: Diagnosis not present

## 2020-11-06 DIAGNOSIS — M79675 Pain in left toe(s): Secondary | ICD-10-CM | POA: Diagnosis not present

## 2020-11-06 DIAGNOSIS — I739 Peripheral vascular disease, unspecified: Secondary | ICD-10-CM | POA: Diagnosis not present

## 2020-11-07 DIAGNOSIS — L89322 Pressure ulcer of left buttock, stage 2: Secondary | ICD-10-CM | POA: Diagnosis not present

## 2020-11-07 DIAGNOSIS — M150 Primary generalized (osteo)arthritis: Secondary | ICD-10-CM | POA: Diagnosis not present

## 2020-11-07 DIAGNOSIS — I129 Hypertensive chronic kidney disease with stage 1 through stage 4 chronic kidney disease, or unspecified chronic kidney disease: Secondary | ICD-10-CM | POA: Diagnosis not present

## 2020-11-13 DIAGNOSIS — M1990 Unspecified osteoarthritis, unspecified site: Secondary | ICD-10-CM | POA: Diagnosis not present

## 2020-11-13 DIAGNOSIS — N189 Chronic kidney disease, unspecified: Secondary | ICD-10-CM | POA: Diagnosis not present

## 2020-11-13 DIAGNOSIS — E039 Hypothyroidism, unspecified: Secondary | ICD-10-CM | POA: Diagnosis not present

## 2020-11-13 DIAGNOSIS — I10 Essential (primary) hypertension: Secondary | ICD-10-CM | POA: Diagnosis not present

## 2020-11-18 DIAGNOSIS — I739 Peripheral vascular disease, unspecified: Secondary | ICD-10-CM | POA: Diagnosis not present

## 2020-11-18 DIAGNOSIS — M79606 Pain in leg, unspecified: Secondary | ICD-10-CM | POA: Diagnosis not present

## 2020-11-19 DIAGNOSIS — I6523 Occlusion and stenosis of bilateral carotid arteries: Secondary | ICD-10-CM | POA: Diagnosis not present

## 2020-11-19 DIAGNOSIS — R0989 Other specified symptoms and signs involving the circulatory and respiratory systems: Secondary | ICD-10-CM | POA: Diagnosis not present

## 2020-11-21 DIAGNOSIS — R221 Localized swelling, mass and lump, neck: Secondary | ICD-10-CM | POA: Diagnosis not present

## 2020-11-21 DIAGNOSIS — R1310 Dysphagia, unspecified: Secondary | ICD-10-CM | POA: Diagnosis not present

## 2020-11-25 DIAGNOSIS — I714 Abdominal aortic aneurysm, without rupture: Secondary | ICD-10-CM | POA: Diagnosis not present

## 2020-11-25 DIAGNOSIS — I700 Atherosclerosis of aorta: Secondary | ICD-10-CM | POA: Diagnosis not present

## 2020-11-26 DIAGNOSIS — R590 Localized enlarged lymph nodes: Secondary | ICD-10-CM | POA: Diagnosis not present

## 2020-11-26 DIAGNOSIS — D492 Neoplasm of unspecified behavior of bone, soft tissue, and skin: Secondary | ICD-10-CM | POA: Diagnosis not present

## 2020-11-26 DIAGNOSIS — R229 Localized swelling, mass and lump, unspecified: Secondary | ICD-10-CM | POA: Diagnosis not present

## 2020-11-29 DIAGNOSIS — I70223 Atherosclerosis of native arteries of extremities with rest pain, bilateral legs: Secondary | ICD-10-CM | POA: Diagnosis not present

## 2020-11-29 DIAGNOSIS — M79606 Pain in leg, unspecified: Secondary | ICD-10-CM | POA: Diagnosis not present

## 2020-12-05 DIAGNOSIS — R011 Cardiac murmur, unspecified: Secondary | ICD-10-CM | POA: Diagnosis not present

## 2020-12-11 DIAGNOSIS — M1990 Unspecified osteoarthritis, unspecified site: Secondary | ICD-10-CM | POA: Diagnosis not present

## 2020-12-11 DIAGNOSIS — N189 Chronic kidney disease, unspecified: Secondary | ICD-10-CM | POA: Diagnosis not present

## 2020-12-11 DIAGNOSIS — E039 Hypothyroidism, unspecified: Secondary | ICD-10-CM | POA: Diagnosis not present

## 2020-12-11 DIAGNOSIS — I10 Essential (primary) hypertension: Secondary | ICD-10-CM | POA: Diagnosis not present

## 2020-12-25 DIAGNOSIS — R112 Nausea with vomiting, unspecified: Secondary | ICD-10-CM | POA: Diagnosis not present

## 2020-12-31 DIAGNOSIS — S81811A Laceration without foreign body, right lower leg, initial encounter: Secondary | ICD-10-CM | POA: Diagnosis not present

## 2021-01-29 DIAGNOSIS — E039 Hypothyroidism, unspecified: Secondary | ICD-10-CM | POA: Diagnosis not present

## 2021-01-29 DIAGNOSIS — M1990 Unspecified osteoarthritis, unspecified site: Secondary | ICD-10-CM | POA: Diagnosis not present

## 2021-01-29 DIAGNOSIS — H903 Sensorineural hearing loss, bilateral: Secondary | ICD-10-CM | POA: Diagnosis not present

## 2021-01-29 DIAGNOSIS — H6123 Impacted cerumen, bilateral: Secondary | ICD-10-CM | POA: Diagnosis not present

## 2021-01-29 DIAGNOSIS — N189 Chronic kidney disease, unspecified: Secondary | ICD-10-CM | POA: Diagnosis not present

## 2021-01-29 DIAGNOSIS — I10 Essential (primary) hypertension: Secondary | ICD-10-CM | POA: Diagnosis not present

## 2021-02-05 DIAGNOSIS — L600 Ingrowing nail: Secondary | ICD-10-CM | POA: Diagnosis not present

## 2021-02-05 DIAGNOSIS — M79674 Pain in right toe(s): Secondary | ICD-10-CM | POA: Diagnosis not present

## 2021-02-05 DIAGNOSIS — I739 Peripheral vascular disease, unspecified: Secondary | ICD-10-CM | POA: Diagnosis not present

## 2021-02-05 DIAGNOSIS — M79675 Pain in left toe(s): Secondary | ICD-10-CM | POA: Diagnosis not present

## 2021-02-25 DIAGNOSIS — M1990 Unspecified osteoarthritis, unspecified site: Secondary | ICD-10-CM | POA: Diagnosis not present

## 2021-02-25 DIAGNOSIS — E039 Hypothyroidism, unspecified: Secondary | ICD-10-CM | POA: Diagnosis not present

## 2021-02-25 DIAGNOSIS — N189 Chronic kidney disease, unspecified: Secondary | ICD-10-CM | POA: Diagnosis not present

## 2021-02-25 DIAGNOSIS — I10 Essential (primary) hypertension: Secondary | ICD-10-CM | POA: Diagnosis not present

## 2021-03-28 DIAGNOSIS — E039 Hypothyroidism, unspecified: Secondary | ICD-10-CM | POA: Diagnosis not present

## 2021-03-28 DIAGNOSIS — I10 Essential (primary) hypertension: Secondary | ICD-10-CM | POA: Diagnosis not present

## 2021-03-28 DIAGNOSIS — M1990 Unspecified osteoarthritis, unspecified site: Secondary | ICD-10-CM | POA: Diagnosis not present

## 2021-03-28 DIAGNOSIS — N189 Chronic kidney disease, unspecified: Secondary | ICD-10-CM | POA: Diagnosis not present

## 2021-04-09 DIAGNOSIS — I10 Essential (primary) hypertension: Secondary | ICD-10-CM | POA: Diagnosis not present

## 2021-04-23 DIAGNOSIS — M109 Gout, unspecified: Secondary | ICD-10-CM | POA: Diagnosis not present

## 2021-04-23 DIAGNOSIS — M1990 Unspecified osteoarthritis, unspecified site: Secondary | ICD-10-CM | POA: Diagnosis not present

## 2021-04-23 DIAGNOSIS — N189 Chronic kidney disease, unspecified: Secondary | ICD-10-CM | POA: Diagnosis not present

## 2021-04-23 DIAGNOSIS — E039 Hypothyroidism, unspecified: Secondary | ICD-10-CM | POA: Diagnosis not present

## 2021-05-07 DIAGNOSIS — M79674 Pain in right toe(s): Secondary | ICD-10-CM | POA: Diagnosis not present

## 2021-05-07 DIAGNOSIS — M79675 Pain in left toe(s): Secondary | ICD-10-CM | POA: Diagnosis not present

## 2021-05-07 DIAGNOSIS — L600 Ingrowing nail: Secondary | ICD-10-CM | POA: Diagnosis not present

## 2021-05-07 DIAGNOSIS — I739 Peripheral vascular disease, unspecified: Secondary | ICD-10-CM | POA: Diagnosis not present

## 2021-05-14 DIAGNOSIS — E7849 Other hyperlipidemia: Secondary | ICD-10-CM | POA: Diagnosis not present

## 2021-05-14 DIAGNOSIS — E119 Type 2 diabetes mellitus without complications: Secondary | ICD-10-CM | POA: Diagnosis not present

## 2021-05-14 DIAGNOSIS — Z79899 Other long term (current) drug therapy: Secondary | ICD-10-CM | POA: Diagnosis not present

## 2021-05-14 DIAGNOSIS — D518 Other vitamin B12 deficiency anemias: Secondary | ICD-10-CM | POA: Diagnosis not present

## 2021-05-20 DIAGNOSIS — I10 Essential (primary) hypertension: Secondary | ICD-10-CM | POA: Diagnosis not present

## 2021-05-21 DIAGNOSIS — K5900 Constipation, unspecified: Secondary | ICD-10-CM | POA: Diagnosis not present

## 2021-05-21 DIAGNOSIS — K648 Other hemorrhoids: Secondary | ICD-10-CM | POA: Diagnosis not present

## 2021-06-01 DIAGNOSIS — M1990 Unspecified osteoarthritis, unspecified site: Secondary | ICD-10-CM | POA: Diagnosis not present

## 2021-06-01 DIAGNOSIS — G629 Polyneuropathy, unspecified: Secondary | ICD-10-CM | POA: Diagnosis not present

## 2021-06-18 DIAGNOSIS — E039 Hypothyroidism, unspecified: Secondary | ICD-10-CM | POA: Diagnosis not present

## 2021-06-18 DIAGNOSIS — I10 Essential (primary) hypertension: Secondary | ICD-10-CM | POA: Diagnosis not present

## 2021-06-18 DIAGNOSIS — M1990 Unspecified osteoarthritis, unspecified site: Secondary | ICD-10-CM | POA: Diagnosis not present

## 2021-06-18 DIAGNOSIS — N189 Chronic kidney disease, unspecified: Secondary | ICD-10-CM | POA: Diagnosis not present

## 2021-07-16 DIAGNOSIS — E039 Hypothyroidism, unspecified: Secondary | ICD-10-CM | POA: Diagnosis not present

## 2021-07-16 DIAGNOSIS — I10 Essential (primary) hypertension: Secondary | ICD-10-CM | POA: Diagnosis not present

## 2021-07-16 DIAGNOSIS — N189 Chronic kidney disease, unspecified: Secondary | ICD-10-CM | POA: Diagnosis not present

## 2021-07-16 DIAGNOSIS — M1990 Unspecified osteoarthritis, unspecified site: Secondary | ICD-10-CM | POA: Diagnosis not present

## 2021-08-01 DIAGNOSIS — I10 Essential (primary) hypertension: Secondary | ICD-10-CM | POA: Diagnosis not present

## 2021-08-06 DIAGNOSIS — M79674 Pain in right toe(s): Secondary | ICD-10-CM | POA: Diagnosis not present

## 2021-08-06 DIAGNOSIS — M79675 Pain in left toe(s): Secondary | ICD-10-CM | POA: Diagnosis not present

## 2021-08-06 DIAGNOSIS — L600 Ingrowing nail: Secondary | ICD-10-CM | POA: Diagnosis not present

## 2021-08-06 DIAGNOSIS — I739 Peripheral vascular disease, unspecified: Secondary | ICD-10-CM | POA: Diagnosis not present

## 2021-08-13 DIAGNOSIS — R1032 Left lower quadrant pain: Secondary | ICD-10-CM | POA: Diagnosis not present

## 2021-08-22 DIAGNOSIS — E119 Type 2 diabetes mellitus without complications: Secondary | ICD-10-CM | POA: Diagnosis not present

## 2021-08-22 DIAGNOSIS — D518 Other vitamin B12 deficiency anemias: Secondary | ICD-10-CM | POA: Diagnosis not present

## 2021-08-22 DIAGNOSIS — E7849 Other hyperlipidemia: Secondary | ICD-10-CM | POA: Diagnosis not present

## 2021-08-22 DIAGNOSIS — Z79899 Other long term (current) drug therapy: Secondary | ICD-10-CM | POA: Diagnosis not present

## 2021-09-17 DIAGNOSIS — E039 Hypothyroidism, unspecified: Secondary | ICD-10-CM | POA: Diagnosis not present

## 2021-09-17 DIAGNOSIS — M1990 Unspecified osteoarthritis, unspecified site: Secondary | ICD-10-CM | POA: Diagnosis not present

## 2021-09-17 DIAGNOSIS — N189 Chronic kidney disease, unspecified: Secondary | ICD-10-CM | POA: Diagnosis not present

## 2021-09-17 DIAGNOSIS — I10 Essential (primary) hypertension: Secondary | ICD-10-CM | POA: Diagnosis not present

## 2021-09-30 DIAGNOSIS — I10 Essential (primary) hypertension: Secondary | ICD-10-CM | POA: Diagnosis not present

## 2021-10-14 DIAGNOSIS — Z79899 Other long term (current) drug therapy: Secondary | ICD-10-CM | POA: Diagnosis not present

## 2021-10-14 DIAGNOSIS — E7849 Other hyperlipidemia: Secondary | ICD-10-CM | POA: Diagnosis not present

## 2021-10-14 DIAGNOSIS — D518 Other vitamin B12 deficiency anemias: Secondary | ICD-10-CM | POA: Diagnosis not present

## 2021-10-14 DIAGNOSIS — E119 Type 2 diabetes mellitus without complications: Secondary | ICD-10-CM | POA: Diagnosis not present

## 2021-10-15 DIAGNOSIS — I10 Essential (primary) hypertension: Secondary | ICD-10-CM | POA: Diagnosis not present

## 2021-10-15 DIAGNOSIS — M1990 Unspecified osteoarthritis, unspecified site: Secondary | ICD-10-CM | POA: Diagnosis not present

## 2021-10-15 DIAGNOSIS — N189 Chronic kidney disease, unspecified: Secondary | ICD-10-CM | POA: Diagnosis not present

## 2021-10-15 DIAGNOSIS — K219 Gastro-esophageal reflux disease without esophagitis: Secondary | ICD-10-CM | POA: Diagnosis not present

## 2021-10-29 DIAGNOSIS — I10 Essential (primary) hypertension: Secondary | ICD-10-CM | POA: Diagnosis not present

## 2021-10-31 DIAGNOSIS — Z79899 Other long term (current) drug therapy: Secondary | ICD-10-CM | POA: Diagnosis not present

## 2021-11-03 DIAGNOSIS — E039 Hypothyroidism, unspecified: Secondary | ICD-10-CM | POA: Diagnosis not present

## 2021-11-03 DIAGNOSIS — N189 Chronic kidney disease, unspecified: Secondary | ICD-10-CM | POA: Diagnosis not present

## 2021-11-03 DIAGNOSIS — I10 Essential (primary) hypertension: Secondary | ICD-10-CM | POA: Diagnosis not present

## 2021-11-03 DIAGNOSIS — M1990 Unspecified osteoarthritis, unspecified site: Secondary | ICD-10-CM | POA: Diagnosis not present

## 2021-11-11 DIAGNOSIS — Z79899 Other long term (current) drug therapy: Secondary | ICD-10-CM | POA: Diagnosis not present

## 2021-11-17 DIAGNOSIS — I7091 Generalized atherosclerosis: Secondary | ICD-10-CM | POA: Diagnosis not present

## 2021-12-04 DIAGNOSIS — E039 Hypothyroidism, unspecified: Secondary | ICD-10-CM | POA: Diagnosis not present

## 2021-12-04 DIAGNOSIS — G629 Polyneuropathy, unspecified: Secondary | ICD-10-CM | POA: Diagnosis not present

## 2021-12-04 DIAGNOSIS — M109 Gout, unspecified: Secondary | ICD-10-CM | POA: Diagnosis not present

## 2021-12-04 DIAGNOSIS — M1990 Unspecified osteoarthritis, unspecified site: Secondary | ICD-10-CM | POA: Diagnosis not present

## 2021-12-09 DIAGNOSIS — Z79899 Other long term (current) drug therapy: Secondary | ICD-10-CM | POA: Diagnosis not present

## 2021-12-27 DIAGNOSIS — I10 Essential (primary) hypertension: Secondary | ICD-10-CM | POA: Diagnosis not present

## 2022-01-05 DIAGNOSIS — E039 Hypothyroidism, unspecified: Secondary | ICD-10-CM | POA: Diagnosis not present

## 2022-01-05 DIAGNOSIS — K219 Gastro-esophageal reflux disease without esophagitis: Secondary | ICD-10-CM | POA: Diagnosis not present

## 2022-01-05 DIAGNOSIS — N189 Chronic kidney disease, unspecified: Secondary | ICD-10-CM | POA: Diagnosis not present

## 2022-01-05 DIAGNOSIS — M1990 Unspecified osteoarthritis, unspecified site: Secondary | ICD-10-CM | POA: Diagnosis not present

## 2022-01-06 DIAGNOSIS — E7849 Other hyperlipidemia: Secondary | ICD-10-CM | POA: Diagnosis not present

## 2022-01-06 DIAGNOSIS — Z79899 Other long term (current) drug therapy: Secondary | ICD-10-CM | POA: Diagnosis not present

## 2022-01-06 DIAGNOSIS — D518 Other vitamin B12 deficiency anemias: Secondary | ICD-10-CM | POA: Diagnosis not present

## 2022-01-06 DIAGNOSIS — E119 Type 2 diabetes mellitus without complications: Secondary | ICD-10-CM | POA: Diagnosis not present

## 2022-01-23 DIAGNOSIS — B351 Tinea unguium: Secondary | ICD-10-CM | POA: Diagnosis not present

## 2022-01-23 DIAGNOSIS — M79674 Pain in right toe(s): Secondary | ICD-10-CM | POA: Diagnosis not present

## 2022-01-23 DIAGNOSIS — M79675 Pain in left toe(s): Secondary | ICD-10-CM | POA: Diagnosis not present

## 2022-01-23 DIAGNOSIS — L600 Ingrowing nail: Secondary | ICD-10-CM | POA: Diagnosis not present

## 2022-01-27 DIAGNOSIS — I10 Essential (primary) hypertension: Secondary | ICD-10-CM | POA: Diagnosis not present

## 2022-01-30 DIAGNOSIS — Z79899 Other long term (current) drug therapy: Secondary | ICD-10-CM | POA: Diagnosis not present

## 2022-02-03 DIAGNOSIS — N189 Chronic kidney disease, unspecified: Secondary | ICD-10-CM | POA: Diagnosis not present

## 2022-02-03 DIAGNOSIS — K219 Gastro-esophageal reflux disease without esophagitis: Secondary | ICD-10-CM | POA: Diagnosis not present

## 2022-02-03 DIAGNOSIS — G2581 Restless legs syndrome: Secondary | ICD-10-CM | POA: Diagnosis not present

## 2022-02-03 DIAGNOSIS — M109 Gout, unspecified: Secondary | ICD-10-CM | POA: Diagnosis not present

## 2022-02-26 DIAGNOSIS — I10 Essential (primary) hypertension: Secondary | ICD-10-CM | POA: Diagnosis not present

## 2022-03-03 DIAGNOSIS — N189 Chronic kidney disease, unspecified: Secondary | ICD-10-CM | POA: Diagnosis not present

## 2022-03-03 DIAGNOSIS — M1990 Unspecified osteoarthritis, unspecified site: Secondary | ICD-10-CM | POA: Diagnosis not present

## 2022-03-03 DIAGNOSIS — M109 Gout, unspecified: Secondary | ICD-10-CM | POA: Diagnosis not present

## 2022-03-03 DIAGNOSIS — E039 Hypothyroidism, unspecified: Secondary | ICD-10-CM | POA: Diagnosis not present

## 2022-03-17 DIAGNOSIS — R519 Headache, unspecified: Secondary | ICD-10-CM | POA: Diagnosis not present

## 2022-03-29 DIAGNOSIS — I10 Essential (primary) hypertension: Secondary | ICD-10-CM | POA: Diagnosis not present

## 2022-03-31 DIAGNOSIS — N189 Chronic kidney disease, unspecified: Secondary | ICD-10-CM | POA: Diagnosis not present

## 2022-03-31 DIAGNOSIS — E039 Hypothyroidism, unspecified: Secondary | ICD-10-CM | POA: Diagnosis not present

## 2022-03-31 DIAGNOSIS — M109 Gout, unspecified: Secondary | ICD-10-CM | POA: Diagnosis not present

## 2022-03-31 DIAGNOSIS — M1990 Unspecified osteoarthritis, unspecified site: Secondary | ICD-10-CM | POA: Diagnosis not present

## 2022-04-22 DIAGNOSIS — M79672 Pain in left foot: Secondary | ICD-10-CM | POA: Diagnosis not present

## 2022-04-22 DIAGNOSIS — L600 Ingrowing nail: Secondary | ICD-10-CM | POA: Diagnosis not present

## 2022-04-22 DIAGNOSIS — B351 Tinea unguium: Secondary | ICD-10-CM | POA: Diagnosis not present

## 2022-04-22 DIAGNOSIS — M79671 Pain in right foot: Secondary | ICD-10-CM | POA: Diagnosis not present

## 2022-04-27 DIAGNOSIS — M1990 Unspecified osteoarthritis, unspecified site: Secondary | ICD-10-CM | POA: Diagnosis not present

## 2022-04-27 DIAGNOSIS — E039 Hypothyroidism, unspecified: Secondary | ICD-10-CM | POA: Diagnosis not present

## 2022-04-27 DIAGNOSIS — G629 Polyneuropathy, unspecified: Secondary | ICD-10-CM | POA: Diagnosis not present

## 2022-04-27 DIAGNOSIS — N189 Chronic kidney disease, unspecified: Secondary | ICD-10-CM | POA: Diagnosis not present

## 2022-05-25 DIAGNOSIS — E039 Hypothyroidism, unspecified: Secondary | ICD-10-CM | POA: Diagnosis not present

## 2022-05-25 DIAGNOSIS — N189 Chronic kidney disease, unspecified: Secondary | ICD-10-CM | POA: Diagnosis not present

## 2022-05-25 DIAGNOSIS — I10 Essential (primary) hypertension: Secondary | ICD-10-CM | POA: Diagnosis not present

## 2022-05-25 DIAGNOSIS — M1990 Unspecified osteoarthritis, unspecified site: Secondary | ICD-10-CM | POA: Diagnosis not present

## 2022-06-02 ENCOUNTER — Telehealth: Payer: Self-pay | Admitting: *Deleted

## 2022-06-02 NOTE — Patient Outreach (Signed)
  Care Coordination   Initial Visit Note   06/02/2022 Name: Kari Martinez MRN: [id number] DOB: [birth year]  Kari Martinez is a 88 y.o. year old female who sees Kari Ada, MD for primary care. I spoke with  Kari Martinez's daughter Kari Martinez by phone today (patient resides in assisted living)  What matters to the patients health and wellness today?  "Ongoing health maintenance in assisted living"    Goals Addressed               This Visit's Progress     COMPLETED: "ongoing health maintenance in assisted living facility" (pt-stated)        Care Coordination Interventions: Provided education about taking medications as prescribed, attending all scheduled appointments Spoke with patient's daughter Kari Martinez who reports patient moved to [facility] and now has new primary care provider (no longer sees Kari Martinez), is resident in assisted living facility and is managing well, reports has no needs for care coordination, declines services.         SDOH assessments and interventions completed:  No     Care Coordination Interventions Activated:  No  Care Coordination Interventions:  Yes, provided   Follow up plan: No further intervention required.   Encounter Outcome:  Pt. Visit Completed   Kari Martinez [facility], BSN RN Care Coordinator [phone]

## 2022-06-17 DIAGNOSIS — H524 Presbyopia: Secondary | ICD-10-CM | POA: Diagnosis not present

## 2022-06-22 DIAGNOSIS — E039 Hypothyroidism, unspecified: Secondary | ICD-10-CM | POA: Diagnosis not present

## 2022-06-22 DIAGNOSIS — M1990 Unspecified osteoarthritis, unspecified site: Secondary | ICD-10-CM | POA: Diagnosis not present

## 2022-06-22 DIAGNOSIS — I10 Essential (primary) hypertension: Secondary | ICD-10-CM | POA: Diagnosis not present

## 2022-06-22 DIAGNOSIS — H6122 Impacted cerumen, left ear: Secondary | ICD-10-CM | POA: Diagnosis not present

## 2022-07-02 DIAGNOSIS — B351 Tinea unguium: Secondary | ICD-10-CM | POA: Diagnosis not present

## 2022-07-02 DIAGNOSIS — M79675 Pain in left toe(s): Secondary | ICD-10-CM | POA: Diagnosis not present

## 2022-07-02 DIAGNOSIS — L600 Ingrowing nail: Secondary | ICD-10-CM | POA: Diagnosis not present

## 2022-07-02 DIAGNOSIS — M79674 Pain in right toe(s): Secondary | ICD-10-CM | POA: Diagnosis not present

## 2022-07-27 DIAGNOSIS — K219 Gastro-esophageal reflux disease without esophagitis: Secondary | ICD-10-CM | POA: Diagnosis not present

## 2022-07-27 DIAGNOSIS — G629 Polyneuropathy, unspecified: Secondary | ICD-10-CM | POA: Diagnosis not present

## 2022-07-27 DIAGNOSIS — E039 Hypothyroidism, unspecified: Secondary | ICD-10-CM | POA: Diagnosis not present

## 2022-07-27 DIAGNOSIS — M109 Gout, unspecified: Secondary | ICD-10-CM | POA: Diagnosis not present

## 2022-08-20 DIAGNOSIS — Z79899 Other long term (current) drug therapy: Secondary | ICD-10-CM | POA: Diagnosis not present

## 2022-08-20 DIAGNOSIS — D518 Other vitamin B12 deficiency anemias: Secondary | ICD-10-CM | POA: Diagnosis not present

## 2022-08-20 DIAGNOSIS — E119 Type 2 diabetes mellitus without complications: Secondary | ICD-10-CM | POA: Diagnosis not present

## 2022-08-20 DIAGNOSIS — E782 Mixed hyperlipidemia: Secondary | ICD-10-CM | POA: Diagnosis not present

## 2022-08-24 DIAGNOSIS — G629 Polyneuropathy, unspecified: Secondary | ICD-10-CM | POA: Diagnosis not present

## 2022-08-24 DIAGNOSIS — E039 Hypothyroidism, unspecified: Secondary | ICD-10-CM | POA: Diagnosis not present

## 2022-08-24 DIAGNOSIS — K219 Gastro-esophageal reflux disease without esophagitis: Secondary | ICD-10-CM | POA: Diagnosis not present

## 2022-08-24 DIAGNOSIS — M109 Gout, unspecified: Secondary | ICD-10-CM | POA: Diagnosis not present

## 2022-09-03 DIAGNOSIS — L600 Ingrowing nail: Secondary | ICD-10-CM | POA: Diagnosis not present

## 2022-09-03 DIAGNOSIS — B351 Tinea unguium: Secondary | ICD-10-CM | POA: Diagnosis not present

## 2022-09-03 DIAGNOSIS — M79675 Pain in left toe(s): Secondary | ICD-10-CM | POA: Diagnosis not present

## 2022-09-03 DIAGNOSIS — M79674 Pain in right toe(s): Secondary | ICD-10-CM | POA: Diagnosis not present

## 2022-09-21 DIAGNOSIS — G629 Polyneuropathy, unspecified: Secondary | ICD-10-CM | POA: Diagnosis not present

## 2022-09-21 DIAGNOSIS — M109 Gout, unspecified: Secondary | ICD-10-CM | POA: Diagnosis not present

## 2022-09-21 DIAGNOSIS — I10 Essential (primary) hypertension: Secondary | ICD-10-CM | POA: Diagnosis not present

## 2022-09-21 DIAGNOSIS — E039 Hypothyroidism, unspecified: Secondary | ICD-10-CM | POA: Diagnosis not present

## 2022-10-01 DIAGNOSIS — E782 Mixed hyperlipidemia: Secondary | ICD-10-CM | POA: Diagnosis not present

## 2022-10-01 DIAGNOSIS — D518 Other vitamin B12 deficiency anemias: Secondary | ICD-10-CM | POA: Diagnosis not present

## 2022-10-01 DIAGNOSIS — Z79899 Other long term (current) drug therapy: Secondary | ICD-10-CM | POA: Diagnosis not present

## 2022-10-01 DIAGNOSIS — E119 Type 2 diabetes mellitus without complications: Secondary | ICD-10-CM | POA: Diagnosis not present

## 2022-10-09 DIAGNOSIS — H524 Presbyopia: Secondary | ICD-10-CM | POA: Diagnosis not present

## 2022-10-19 DIAGNOSIS — I10 Essential (primary) hypertension: Secondary | ICD-10-CM | POA: Diagnosis not present

## 2022-10-19 DIAGNOSIS — E039 Hypothyroidism, unspecified: Secondary | ICD-10-CM | POA: Diagnosis not present

## 2022-10-19 DIAGNOSIS — M109 Gout, unspecified: Secondary | ICD-10-CM | POA: Diagnosis not present

## 2022-10-19 DIAGNOSIS — K219 Gastro-esophageal reflux disease without esophagitis: Secondary | ICD-10-CM | POA: Diagnosis not present

## 2022-11-05 DIAGNOSIS — D518 Other vitamin B12 deficiency anemias: Secondary | ICD-10-CM | POA: Diagnosis not present

## 2022-11-05 DIAGNOSIS — E119 Type 2 diabetes mellitus without complications: Secondary | ICD-10-CM | POA: Diagnosis not present

## 2022-11-05 DIAGNOSIS — E782 Mixed hyperlipidemia: Secondary | ICD-10-CM | POA: Diagnosis not present

## 2022-11-05 DIAGNOSIS — Z79899 Other long term (current) drug therapy: Secondary | ICD-10-CM | POA: Diagnosis not present

## 2022-11-16 DIAGNOSIS — K219 Gastro-esophageal reflux disease without esophagitis: Secondary | ICD-10-CM | POA: Diagnosis not present

## 2022-11-16 DIAGNOSIS — M109 Gout, unspecified: Secondary | ICD-10-CM | POA: Diagnosis not present

## 2022-11-16 DIAGNOSIS — E039 Hypothyroidism, unspecified: Secondary | ICD-10-CM | POA: Diagnosis not present

## 2022-11-16 DIAGNOSIS — I10 Essential (primary) hypertension: Secondary | ICD-10-CM | POA: Diagnosis not present

## 2022-11-17 DIAGNOSIS — M79675 Pain in left toe(s): Secondary | ICD-10-CM | POA: Diagnosis not present

## 2022-11-17 DIAGNOSIS — E039 Hypothyroidism, unspecified: Secondary | ICD-10-CM | POA: Diagnosis not present

## 2022-11-17 DIAGNOSIS — M1990 Unspecified osteoarthritis, unspecified site: Secondary | ICD-10-CM | POA: Diagnosis not present

## 2022-11-17 DIAGNOSIS — M79674 Pain in right toe(s): Secondary | ICD-10-CM | POA: Diagnosis not present

## 2022-11-17 DIAGNOSIS — N189 Chronic kidney disease, unspecified: Secondary | ICD-10-CM | POA: Diagnosis not present

## 2022-11-17 DIAGNOSIS — B351 Tinea unguium: Secondary | ICD-10-CM | POA: Diagnosis not present

## 2022-11-17 DIAGNOSIS — I10 Essential (primary) hypertension: Secondary | ICD-10-CM | POA: Diagnosis not present

## 2022-11-17 DIAGNOSIS — L600 Ingrowing nail: Secondary | ICD-10-CM | POA: Diagnosis not present

## 2022-11-29 DIAGNOSIS — I10 Essential (primary) hypertension: Secondary | ICD-10-CM | POA: Diagnosis not present

## 2022-12-11 DIAGNOSIS — E119 Type 2 diabetes mellitus without complications: Secondary | ICD-10-CM | POA: Diagnosis not present

## 2022-12-11 DIAGNOSIS — I10 Essential (primary) hypertension: Secondary | ICD-10-CM | POA: Diagnosis not present

## 2022-12-11 DIAGNOSIS — E039 Hypothyroidism, unspecified: Secondary | ICD-10-CM | POA: Diagnosis not present

## 2022-12-11 DIAGNOSIS — M1990 Unspecified osteoarthritis, unspecified site: Secondary | ICD-10-CM | POA: Diagnosis not present

## 2022-12-21 DIAGNOSIS — I10 Essential (primary) hypertension: Secondary | ICD-10-CM | POA: Diagnosis not present

## 2022-12-21 DIAGNOSIS — E039 Hypothyroidism, unspecified: Secondary | ICD-10-CM | POA: Diagnosis not present

## 2022-12-21 DIAGNOSIS — M109 Gout, unspecified: Secondary | ICD-10-CM | POA: Diagnosis not present

## 2022-12-21 DIAGNOSIS — K219 Gastro-esophageal reflux disease without esophagitis: Secondary | ICD-10-CM | POA: Diagnosis not present

## 2022-12-25 DIAGNOSIS — R011 Cardiac murmur, unspecified: Secondary | ICD-10-CM | POA: Diagnosis not present

## 2023-01-09 DIAGNOSIS — M1990 Unspecified osteoarthritis, unspecified site: Secondary | ICD-10-CM | POA: Diagnosis not present

## 2023-01-09 DIAGNOSIS — N189 Chronic kidney disease, unspecified: Secondary | ICD-10-CM | POA: Diagnosis not present

## 2023-01-09 DIAGNOSIS — E039 Hypothyroidism, unspecified: Secondary | ICD-10-CM | POA: Diagnosis not present

## 2023-01-09 DIAGNOSIS — I10 Essential (primary) hypertension: Secondary | ICD-10-CM | POA: Diagnosis not present

## 2023-01-18 DIAGNOSIS — E039 Hypothyroidism, unspecified: Secondary | ICD-10-CM | POA: Diagnosis not present

## 2023-01-18 DIAGNOSIS — G629 Polyneuropathy, unspecified: Secondary | ICD-10-CM | POA: Diagnosis not present

## 2023-01-18 DIAGNOSIS — I10 Essential (primary) hypertension: Secondary | ICD-10-CM | POA: Diagnosis not present

## 2023-01-18 DIAGNOSIS — M109 Gout, unspecified: Secondary | ICD-10-CM | POA: Diagnosis not present

## 2023-01-26 DIAGNOSIS — M79674 Pain in right toe(s): Secondary | ICD-10-CM | POA: Diagnosis not present

## 2023-01-26 DIAGNOSIS — M79675 Pain in left toe(s): Secondary | ICD-10-CM | POA: Diagnosis not present

## 2023-01-26 DIAGNOSIS — B351 Tinea unguium: Secondary | ICD-10-CM | POA: Diagnosis not present

## 2023-01-28 DIAGNOSIS — I10 Essential (primary) hypertension: Secondary | ICD-10-CM | POA: Diagnosis not present

## 2023-02-03 DIAGNOSIS — Z79899 Other long term (current) drug therapy: Secondary | ICD-10-CM | POA: Diagnosis not present

## 2023-02-03 DIAGNOSIS — E782 Mixed hyperlipidemia: Secondary | ICD-10-CM | POA: Diagnosis not present

## 2023-02-03 DIAGNOSIS — E119 Type 2 diabetes mellitus without complications: Secondary | ICD-10-CM | POA: Diagnosis not present

## 2023-02-03 DIAGNOSIS — D518 Other vitamin B12 deficiency anemias: Secondary | ICD-10-CM | POA: Diagnosis not present

## 2023-02-14 DIAGNOSIS — M1990 Unspecified osteoarthritis, unspecified site: Secondary | ICD-10-CM | POA: Diagnosis not present

## 2023-02-14 DIAGNOSIS — E039 Hypothyroidism, unspecified: Secondary | ICD-10-CM | POA: Diagnosis not present

## 2023-02-14 DIAGNOSIS — N189 Chronic kidney disease, unspecified: Secondary | ICD-10-CM | POA: Diagnosis not present

## 2023-02-14 DIAGNOSIS — I10 Essential (primary) hypertension: Secondary | ICD-10-CM | POA: Diagnosis not present

## 2023-02-22 DIAGNOSIS — E039 Hypothyroidism, unspecified: Secondary | ICD-10-CM | POA: Diagnosis not present

## 2023-02-22 DIAGNOSIS — K219 Gastro-esophageal reflux disease without esophagitis: Secondary | ICD-10-CM | POA: Diagnosis not present

## 2023-02-22 DIAGNOSIS — M109 Gout, unspecified: Secondary | ICD-10-CM | POA: Diagnosis not present

## 2023-02-22 DIAGNOSIS — G629 Polyneuropathy, unspecified: Secondary | ICD-10-CM | POA: Diagnosis not present

## 2023-02-27 DIAGNOSIS — I10 Essential (primary) hypertension: Secondary | ICD-10-CM | POA: Diagnosis not present

## 2023-03-25 DIAGNOSIS — M1990 Unspecified osteoarthritis, unspecified site: Secondary | ICD-10-CM | POA: Diagnosis not present

## 2023-03-25 DIAGNOSIS — N189 Chronic kidney disease, unspecified: Secondary | ICD-10-CM | POA: Diagnosis not present

## 2023-03-25 DIAGNOSIS — E039 Hypothyroidism, unspecified: Secondary | ICD-10-CM | POA: Diagnosis not present

## 2023-03-25 DIAGNOSIS — I10 Essential (primary) hypertension: Secondary | ICD-10-CM | POA: Diagnosis not present

## 2023-03-30 DIAGNOSIS — I10 Essential (primary) hypertension: Secondary | ICD-10-CM | POA: Diagnosis not present

## 2023-04-02 DIAGNOSIS — M79674 Pain in right toe(s): Secondary | ICD-10-CM | POA: Diagnosis not present

## 2023-04-02 DIAGNOSIS — M79675 Pain in left toe(s): Secondary | ICD-10-CM | POA: Diagnosis not present

## 2023-04-02 DIAGNOSIS — B351 Tinea unguium: Secondary | ICD-10-CM | POA: Diagnosis not present

## 2023-04-22 DIAGNOSIS — E039 Hypothyroidism, unspecified: Secondary | ICD-10-CM | POA: Diagnosis not present

## 2023-04-22 DIAGNOSIS — M109 Gout, unspecified: Secondary | ICD-10-CM | POA: Diagnosis not present

## 2023-04-22 DIAGNOSIS — I10 Essential (primary) hypertension: Secondary | ICD-10-CM | POA: Diagnosis not present

## 2023-04-22 DIAGNOSIS — K219 Gastro-esophageal reflux disease without esophagitis: Secondary | ICD-10-CM | POA: Diagnosis not present

## 2023-04-28 DIAGNOSIS — E119 Type 2 diabetes mellitus without complications: Secondary | ICD-10-CM | POA: Diagnosis not present

## 2023-04-28 DIAGNOSIS — M1990 Unspecified osteoarthritis, unspecified site: Secondary | ICD-10-CM | POA: Diagnosis not present

## 2023-04-28 DIAGNOSIS — N189 Chronic kidney disease, unspecified: Secondary | ICD-10-CM | POA: Diagnosis not present

## 2023-04-28 DIAGNOSIS — I10 Essential (primary) hypertension: Secondary | ICD-10-CM | POA: Diagnosis not present

## 2023-04-29 DIAGNOSIS — I10 Essential (primary) hypertension: Secondary | ICD-10-CM | POA: Diagnosis not present

## 2023-05-12 DIAGNOSIS — E119 Type 2 diabetes mellitus without complications: Secondary | ICD-10-CM | POA: Diagnosis not present

## 2023-05-12 DIAGNOSIS — D518 Other vitamin B12 deficiency anemias: Secondary | ICD-10-CM | POA: Diagnosis not present

## 2023-05-12 DIAGNOSIS — E782 Mixed hyperlipidemia: Secondary | ICD-10-CM | POA: Diagnosis not present

## 2023-05-12 DIAGNOSIS — Z79899 Other long term (current) drug therapy: Secondary | ICD-10-CM | POA: Diagnosis not present

## 2023-05-18 DIAGNOSIS — M1990 Unspecified osteoarthritis, unspecified site: Secondary | ICD-10-CM | POA: Diagnosis not present

## 2023-05-18 DIAGNOSIS — N189 Chronic kidney disease, unspecified: Secondary | ICD-10-CM | POA: Diagnosis not present

## 2023-05-18 DIAGNOSIS — I10 Essential (primary) hypertension: Secondary | ICD-10-CM | POA: Diagnosis not present

## 2023-05-18 DIAGNOSIS — E039 Hypothyroidism, unspecified: Secondary | ICD-10-CM | POA: Diagnosis not present

## 2023-05-20 DIAGNOSIS — M109 Gout, unspecified: Secondary | ICD-10-CM | POA: Diagnosis not present

## 2023-05-20 DIAGNOSIS — I10 Essential (primary) hypertension: Secondary | ICD-10-CM | POA: Diagnosis not present

## 2023-05-20 DIAGNOSIS — E039 Hypothyroidism, unspecified: Secondary | ICD-10-CM | POA: Diagnosis not present

## 2023-05-20 DIAGNOSIS — K219 Gastro-esophageal reflux disease without esophagitis: Secondary | ICD-10-CM | POA: Diagnosis not present

## 2023-05-30 DIAGNOSIS — I10 Essential (primary) hypertension: Secondary | ICD-10-CM | POA: Diagnosis not present

## 2023-06-15 DIAGNOSIS — L600 Ingrowing nail: Secondary | ICD-10-CM | POA: Diagnosis not present

## 2023-06-15 DIAGNOSIS — B351 Tinea unguium: Secondary | ICD-10-CM | POA: Diagnosis not present

## 2023-06-15 DIAGNOSIS — M79675 Pain in left toe(s): Secondary | ICD-10-CM | POA: Diagnosis not present

## 2023-06-15 DIAGNOSIS — M79674 Pain in right toe(s): Secondary | ICD-10-CM | POA: Diagnosis not present

## 2023-06-18 DIAGNOSIS — M109 Gout, unspecified: Secondary | ICD-10-CM | POA: Diagnosis not present

## 2023-06-18 DIAGNOSIS — G629 Polyneuropathy, unspecified: Secondary | ICD-10-CM | POA: Diagnosis not present

## 2023-06-18 DIAGNOSIS — E039 Hypothyroidism, unspecified: Secondary | ICD-10-CM | POA: Diagnosis not present

## 2023-06-18 DIAGNOSIS — I10 Essential (primary) hypertension: Secondary | ICD-10-CM | POA: Diagnosis not present

## 2023-06-22 DIAGNOSIS — E039 Hypothyroidism, unspecified: Secondary | ICD-10-CM | POA: Diagnosis not present

## 2023-06-22 DIAGNOSIS — M1990 Unspecified osteoarthritis, unspecified site: Secondary | ICD-10-CM | POA: Diagnosis not present

## 2023-06-22 DIAGNOSIS — N189 Chronic kidney disease, unspecified: Secondary | ICD-10-CM | POA: Diagnosis not present

## 2023-06-22 DIAGNOSIS — I10 Essential (primary) hypertension: Secondary | ICD-10-CM | POA: Diagnosis not present

## 2023-06-30 DIAGNOSIS — I10 Essential (primary) hypertension: Secondary | ICD-10-CM | POA: Diagnosis not present

## 2023-07-16 DIAGNOSIS — M1990 Unspecified osteoarthritis, unspecified site: Secondary | ICD-10-CM | POA: Diagnosis not present

## 2023-07-16 DIAGNOSIS — E039 Hypothyroidism, unspecified: Secondary | ICD-10-CM | POA: Diagnosis not present

## 2023-07-16 DIAGNOSIS — I10 Essential (primary) hypertension: Secondary | ICD-10-CM | POA: Diagnosis not present

## 2023-07-16 DIAGNOSIS — N189 Chronic kidney disease, unspecified: Secondary | ICD-10-CM | POA: Diagnosis not present

## 2023-07-29 DIAGNOSIS — I10 Essential (primary) hypertension: Secondary | ICD-10-CM | POA: Diagnosis not present

## 2023-07-29 DIAGNOSIS — M109 Gout, unspecified: Secondary | ICD-10-CM | POA: Diagnosis not present

## 2023-07-29 DIAGNOSIS — M25532 Pain in left wrist: Secondary | ICD-10-CM | POA: Diagnosis not present

## 2023-07-29 DIAGNOSIS — E039 Hypothyroidism, unspecified: Secondary | ICD-10-CM | POA: Diagnosis not present

## 2023-07-30 DIAGNOSIS — I10 Essential (primary) hypertension: Secondary | ICD-10-CM | POA: Diagnosis not present

## 2023-08-03 DIAGNOSIS — Z79891 Long term (current) use of opiate analgesic: Secondary | ICD-10-CM | POA: Diagnosis not present

## 2023-08-03 DIAGNOSIS — Z9181 History of falling: Secondary | ICD-10-CM | POA: Diagnosis not present

## 2023-08-03 DIAGNOSIS — M19032 Primary osteoarthritis, left wrist: Secondary | ICD-10-CM | POA: Diagnosis not present

## 2023-08-04 DIAGNOSIS — M19032 Primary osteoarthritis, left wrist: Secondary | ICD-10-CM | POA: Diagnosis not present

## 2023-08-04 DIAGNOSIS — Z79891 Long term (current) use of opiate analgesic: Secondary | ICD-10-CM | POA: Diagnosis not present

## 2023-08-04 DIAGNOSIS — Z9181 History of falling: Secondary | ICD-10-CM | POA: Diagnosis not present

## 2023-08-09 DIAGNOSIS — M19032 Primary osteoarthritis, left wrist: Secondary | ICD-10-CM | POA: Diagnosis not present

## 2023-08-09 DIAGNOSIS — Z79891 Long term (current) use of opiate analgesic: Secondary | ICD-10-CM | POA: Diagnosis not present

## 2023-08-09 DIAGNOSIS — Z9181 History of falling: Secondary | ICD-10-CM | POA: Diagnosis not present

## 2023-08-11 DIAGNOSIS — E782 Mixed hyperlipidemia: Secondary | ICD-10-CM | POA: Diagnosis not present

## 2023-08-11 DIAGNOSIS — Z79899 Other long term (current) drug therapy: Secondary | ICD-10-CM | POA: Diagnosis not present

## 2023-08-11 DIAGNOSIS — D519 Vitamin B12 deficiency anemia, unspecified: Secondary | ICD-10-CM | POA: Diagnosis not present

## 2023-08-11 DIAGNOSIS — M19032 Primary osteoarthritis, left wrist: Secondary | ICD-10-CM | POA: Diagnosis not present

## 2023-08-11 DIAGNOSIS — Z9181 History of falling: Secondary | ICD-10-CM | POA: Diagnosis not present

## 2023-08-11 DIAGNOSIS — Z79891 Long term (current) use of opiate analgesic: Secondary | ICD-10-CM | POA: Diagnosis not present

## 2023-08-11 DIAGNOSIS — E119 Type 2 diabetes mellitus without complications: Secondary | ICD-10-CM | POA: Diagnosis not present

## 2023-08-16 DIAGNOSIS — N189 Chronic kidney disease, unspecified: Secondary | ICD-10-CM | POA: Diagnosis not present

## 2023-08-16 DIAGNOSIS — E119 Type 2 diabetes mellitus without complications: Secondary | ICD-10-CM | POA: Diagnosis not present

## 2023-08-16 DIAGNOSIS — I10 Essential (primary) hypertension: Secondary | ICD-10-CM | POA: Diagnosis not present

## 2023-08-16 DIAGNOSIS — E039 Hypothyroidism, unspecified: Secondary | ICD-10-CM | POA: Diagnosis not present

## 2023-08-17 DIAGNOSIS — B351 Tinea unguium: Secondary | ICD-10-CM | POA: Diagnosis not present

## 2023-08-18 DIAGNOSIS — M19032 Primary osteoarthritis, left wrist: Secondary | ICD-10-CM | POA: Diagnosis not present

## 2023-08-18 DIAGNOSIS — Z79891 Long term (current) use of opiate analgesic: Secondary | ICD-10-CM | POA: Diagnosis not present

## 2023-08-18 DIAGNOSIS — Z9181 History of falling: Secondary | ICD-10-CM | POA: Diagnosis not present

## 2023-08-25 DIAGNOSIS — Z9181 History of falling: Secondary | ICD-10-CM | POA: Diagnosis not present

## 2023-08-25 DIAGNOSIS — Z79891 Long term (current) use of opiate analgesic: Secondary | ICD-10-CM | POA: Diagnosis not present

## 2023-08-25 DIAGNOSIS — M19032 Primary osteoarthritis, left wrist: Secondary | ICD-10-CM | POA: Diagnosis not present

## 2023-08-30 DIAGNOSIS — I10 Essential (primary) hypertension: Secondary | ICD-10-CM | POA: Diagnosis not present

## 2023-09-01 DIAGNOSIS — Z9181 History of falling: Secondary | ICD-10-CM | POA: Diagnosis not present

## 2023-09-01 DIAGNOSIS — M19032 Primary osteoarthritis, left wrist: Secondary | ICD-10-CM | POA: Diagnosis not present

## 2023-09-01 DIAGNOSIS — Z79891 Long term (current) use of opiate analgesic: Secondary | ICD-10-CM | POA: Diagnosis not present

## 2023-09-03 DIAGNOSIS — E039 Hypothyroidism, unspecified: Secondary | ICD-10-CM | POA: Diagnosis not present

## 2023-09-03 DIAGNOSIS — I10 Essential (primary) hypertension: Secondary | ICD-10-CM | POA: Diagnosis not present

## 2023-09-03 DIAGNOSIS — K5900 Constipation, unspecified: Secondary | ICD-10-CM | POA: Diagnosis not present

## 2023-09-03 DIAGNOSIS — K219 Gastro-esophageal reflux disease without esophagitis: Secondary | ICD-10-CM | POA: Diagnosis not present

## 2023-09-08 DIAGNOSIS — Z79891 Long term (current) use of opiate analgesic: Secondary | ICD-10-CM | POA: Diagnosis not present

## 2023-09-08 DIAGNOSIS — M19032 Primary osteoarthritis, left wrist: Secondary | ICD-10-CM | POA: Diagnosis not present

## 2023-09-08 DIAGNOSIS — Z9181 History of falling: Secondary | ICD-10-CM | POA: Diagnosis not present

## 2023-09-13 DIAGNOSIS — M19032 Primary osteoarthritis, left wrist: Secondary | ICD-10-CM | POA: Diagnosis not present

## 2023-09-13 DIAGNOSIS — Z9181 History of falling: Secondary | ICD-10-CM | POA: Diagnosis not present

## 2023-09-13 DIAGNOSIS — Z79891 Long term (current) use of opiate analgesic: Secondary | ICD-10-CM | POA: Diagnosis not present

## 2023-09-15 DIAGNOSIS — Z79891 Long term (current) use of opiate analgesic: Secondary | ICD-10-CM | POA: Diagnosis not present

## 2023-09-15 DIAGNOSIS — Z9181 History of falling: Secondary | ICD-10-CM | POA: Diagnosis not present

## 2023-09-15 DIAGNOSIS — M19032 Primary osteoarthritis, left wrist: Secondary | ICD-10-CM | POA: Diagnosis not present

## 2023-09-15 DIAGNOSIS — R509 Fever, unspecified: Secondary | ICD-10-CM | POA: Diagnosis not present

## 2023-09-16 DIAGNOSIS — M1990 Unspecified osteoarthritis, unspecified site: Secondary | ICD-10-CM | POA: Diagnosis not present

## 2023-09-16 DIAGNOSIS — I10 Essential (primary) hypertension: Secondary | ICD-10-CM | POA: Diagnosis not present

## 2023-09-16 DIAGNOSIS — E039 Hypothyroidism, unspecified: Secondary | ICD-10-CM | POA: Diagnosis not present

## 2023-09-16 DIAGNOSIS — R059 Cough, unspecified: Secondary | ICD-10-CM | POA: Diagnosis not present

## 2023-09-16 DIAGNOSIS — N189 Chronic kidney disease, unspecified: Secondary | ICD-10-CM | POA: Diagnosis not present

## 2023-09-30 DIAGNOSIS — I10 Essential (primary) hypertension: Secondary | ICD-10-CM | POA: Diagnosis not present

## 2023-10-01 DIAGNOSIS — I10 Essential (primary) hypertension: Secondary | ICD-10-CM | POA: Diagnosis not present

## 2023-10-01 DIAGNOSIS — K219 Gastro-esophageal reflux disease without esophagitis: Secondary | ICD-10-CM | POA: Diagnosis not present

## 2023-10-01 DIAGNOSIS — M109 Gout, unspecified: Secondary | ICD-10-CM | POA: Diagnosis not present

## 2023-10-01 DIAGNOSIS — E039 Hypothyroidism, unspecified: Secondary | ICD-10-CM | POA: Diagnosis not present

## 2023-10-14 DIAGNOSIS — M1990 Unspecified osteoarthritis, unspecified site: Secondary | ICD-10-CM | POA: Diagnosis not present

## 2023-10-14 DIAGNOSIS — N189 Chronic kidney disease, unspecified: Secondary | ICD-10-CM | POA: Diagnosis not present

## 2023-10-14 DIAGNOSIS — E119 Type 2 diabetes mellitus without complications: Secondary | ICD-10-CM | POA: Diagnosis not present

## 2023-10-14 DIAGNOSIS — I10 Essential (primary) hypertension: Secondary | ICD-10-CM | POA: Diagnosis not present

## 2023-10-19 DIAGNOSIS — B351 Tinea unguium: Secondary | ICD-10-CM | POA: Diagnosis not present

## 2023-10-31 DIAGNOSIS — I10 Essential (primary) hypertension: Secondary | ICD-10-CM | POA: Diagnosis not present

## 2023-11-01 DIAGNOSIS — M1990 Unspecified osteoarthritis, unspecified site: Secondary | ICD-10-CM | POA: Diagnosis not present

## 2023-11-01 DIAGNOSIS — E559 Vitamin D deficiency, unspecified: Secondary | ICD-10-CM | POA: Diagnosis not present

## 2023-11-01 DIAGNOSIS — E039 Hypothyroidism, unspecified: Secondary | ICD-10-CM | POA: Diagnosis not present

## 2023-11-01 DIAGNOSIS — I10 Essential (primary) hypertension: Secondary | ICD-10-CM | POA: Diagnosis not present

## 2023-11-10 DIAGNOSIS — D519 Vitamin B12 deficiency anemia, unspecified: Secondary | ICD-10-CM | POA: Diagnosis not present

## 2023-11-10 DIAGNOSIS — E782 Mixed hyperlipidemia: Secondary | ICD-10-CM | POA: Diagnosis not present

## 2023-11-10 DIAGNOSIS — Z79899 Other long term (current) drug therapy: Secondary | ICD-10-CM | POA: Diagnosis not present

## 2023-11-10 DIAGNOSIS — R7309 Other abnormal glucose: Secondary | ICD-10-CM | POA: Diagnosis not present

## 2023-11-10 DIAGNOSIS — E038 Other specified hypothyroidism: Secondary | ICD-10-CM | POA: Diagnosis not present

## 2023-11-23 DIAGNOSIS — I10 Essential (primary) hypertension: Secondary | ICD-10-CM | POA: Diagnosis not present

## 2023-11-23 DIAGNOSIS — E039 Hypothyroidism, unspecified: Secondary | ICD-10-CM | POA: Diagnosis not present

## 2023-11-23 DIAGNOSIS — N189 Chronic kidney disease, unspecified: Secondary | ICD-10-CM | POA: Diagnosis not present

## 2023-11-23 DIAGNOSIS — M1990 Unspecified osteoarthritis, unspecified site: Secondary | ICD-10-CM | POA: Diagnosis not present

## 2023-12-01 DIAGNOSIS — I10 Essential (primary) hypertension: Secondary | ICD-10-CM | POA: Diagnosis not present

## 2023-12-03 DIAGNOSIS — E039 Hypothyroidism, unspecified: Secondary | ICD-10-CM | POA: Diagnosis not present

## 2023-12-03 DIAGNOSIS — I739 Peripheral vascular disease, unspecified: Secondary | ICD-10-CM | POA: Diagnosis not present

## 2023-12-03 DIAGNOSIS — I10 Essential (primary) hypertension: Secondary | ICD-10-CM | POA: Diagnosis not present

## 2023-12-03 DIAGNOSIS — Z0000 Encounter for general adult medical examination without abnormal findings: Secondary | ICD-10-CM | POA: Diagnosis not present

## 2023-12-03 DIAGNOSIS — E559 Vitamin D deficiency, unspecified: Secondary | ICD-10-CM | POA: Diagnosis not present

## 2023-12-23 DIAGNOSIS — N189 Chronic kidney disease, unspecified: Secondary | ICD-10-CM | POA: Diagnosis not present

## 2023-12-23 DIAGNOSIS — I739 Peripheral vascular disease, unspecified: Secondary | ICD-10-CM | POA: Diagnosis not present

## 2023-12-23 DIAGNOSIS — G894 Chronic pain syndrome: Secondary | ICD-10-CM | POA: Diagnosis not present

## 2023-12-23 DIAGNOSIS — E559 Vitamin D deficiency, unspecified: Secondary | ICD-10-CM | POA: Diagnosis not present

## 2023-12-28 DIAGNOSIS — R2232 Localized swelling, mass and lump, left upper limb: Secondary | ICD-10-CM | POA: Diagnosis not present

## 2023-12-28 DIAGNOSIS — B351 Tinea unguium: Secondary | ICD-10-CM | POA: Diagnosis not present

## 2023-12-29 DIAGNOSIS — I10 Essential (primary) hypertension: Secondary | ICD-10-CM | POA: Diagnosis not present

## 2023-12-30 DIAGNOSIS — R590 Localized enlarged lymph nodes: Secondary | ICD-10-CM | POA: Diagnosis not present

## 2023-12-31 DIAGNOSIS — I10 Essential (primary) hypertension: Secondary | ICD-10-CM | POA: Diagnosis not present

## 2023-12-31 DIAGNOSIS — N189 Chronic kidney disease, unspecified: Secondary | ICD-10-CM | POA: Diagnosis not present

## 2023-12-31 DIAGNOSIS — M1990 Unspecified osteoarthritis, unspecified site: Secondary | ICD-10-CM | POA: Diagnosis not present

## 2023-12-31 DIAGNOSIS — E119 Type 2 diabetes mellitus without complications: Secondary | ICD-10-CM | POA: Diagnosis not present

## 2024-01-03 DIAGNOSIS — I77811 Abdominal aortic ectasia: Secondary | ICD-10-CM | POA: Diagnosis not present

## 2024-01-04 DIAGNOSIS — R011 Cardiac murmur, unspecified: Secondary | ICD-10-CM | POA: Diagnosis not present

## 2024-01-05 DIAGNOSIS — R0989 Other specified symptoms and signs involving the circulatory and respiratory systems: Secondary | ICD-10-CM | POA: Diagnosis not present

## 2024-01-05 DIAGNOSIS — I6523 Occlusion and stenosis of bilateral carotid arteries: Secondary | ICD-10-CM | POA: Diagnosis not present

## 2024-01-07 DIAGNOSIS — E042 Nontoxic multinodular goiter: Secondary | ICD-10-CM | POA: Diagnosis not present

## 2024-01-07 DIAGNOSIS — R221 Localized swelling, mass and lump, neck: Secondary | ICD-10-CM | POA: Diagnosis not present

## 2024-01-10 DIAGNOSIS — I70223 Atherosclerosis of native arteries of extremities with rest pain, bilateral legs: Secondary | ICD-10-CM | POA: Diagnosis not present

## 2024-01-11 DIAGNOSIS — E119 Type 2 diabetes mellitus without complications: Secondary | ICD-10-CM | POA: Diagnosis not present

## 2024-01-11 DIAGNOSIS — M1990 Unspecified osteoarthritis, unspecified site: Secondary | ICD-10-CM | POA: Diagnosis not present

## 2024-01-11 DIAGNOSIS — N189 Chronic kidney disease, unspecified: Secondary | ICD-10-CM | POA: Diagnosis not present

## 2024-01-11 DIAGNOSIS — I10 Essential (primary) hypertension: Secondary | ICD-10-CM | POA: Diagnosis not present

## 2024-01-13 DIAGNOSIS — N189 Chronic kidney disease, unspecified: Secondary | ICD-10-CM | POA: Diagnosis not present

## 2024-01-13 DIAGNOSIS — E559 Vitamin D deficiency, unspecified: Secondary | ICD-10-CM | POA: Diagnosis not present

## 2024-01-13 DIAGNOSIS — I739 Peripheral vascular disease, unspecified: Secondary | ICD-10-CM | POA: Diagnosis not present

## 2024-01-13 DIAGNOSIS — I10 Essential (primary) hypertension: Secondary | ICD-10-CM | POA: Diagnosis not present

## 2024-01-29 DIAGNOSIS — I10 Essential (primary) hypertension: Secondary | ICD-10-CM | POA: Diagnosis not present

## 2024-02-01 DIAGNOSIS — Z79899 Other long term (current) drug therapy: Secondary | ICD-10-CM | POA: Diagnosis not present

## 2024-02-09 DIAGNOSIS — N189 Chronic kidney disease, unspecified: Secondary | ICD-10-CM | POA: Diagnosis not present

## 2024-02-09 DIAGNOSIS — I10 Essential (primary) hypertension: Secondary | ICD-10-CM | POA: Diagnosis not present

## 2024-02-09 DIAGNOSIS — M1990 Unspecified osteoarthritis, unspecified site: Secondary | ICD-10-CM | POA: Diagnosis not present

## 2024-02-09 DIAGNOSIS — E119 Type 2 diabetes mellitus without complications: Secondary | ICD-10-CM | POA: Diagnosis not present

## 2024-02-10 DIAGNOSIS — I739 Peripheral vascular disease, unspecified: Secondary | ICD-10-CM | POA: Diagnosis not present

## 2024-02-10 DIAGNOSIS — N189 Chronic kidney disease, unspecified: Secondary | ICD-10-CM | POA: Diagnosis not present

## 2024-02-10 DIAGNOSIS — I10 Essential (primary) hypertension: Secondary | ICD-10-CM | POA: Diagnosis not present

## 2024-02-10 DIAGNOSIS — E559 Vitamin D deficiency, unspecified: Secondary | ICD-10-CM | POA: Diagnosis not present

## 2024-02-16 DIAGNOSIS — D519 Vitamin B12 deficiency anemia, unspecified: Secondary | ICD-10-CM | POA: Diagnosis not present

## 2024-02-16 DIAGNOSIS — E782 Mixed hyperlipidemia: Secondary | ICD-10-CM | POA: Diagnosis not present

## 2024-02-16 DIAGNOSIS — R7309 Other abnormal glucose: Secondary | ICD-10-CM | POA: Diagnosis not present

## 2024-02-16 DIAGNOSIS — E559 Vitamin D deficiency, unspecified: Secondary | ICD-10-CM | POA: Diagnosis not present

## 2024-02-16 DIAGNOSIS — Z79899 Other long term (current) drug therapy: Secondary | ICD-10-CM | POA: Diagnosis not present

## 2024-02-16 DIAGNOSIS — E038 Other specified hypothyroidism: Secondary | ICD-10-CM | POA: Diagnosis not present

## 2024-02-28 DIAGNOSIS — I10 Essential (primary) hypertension: Secondary | ICD-10-CM | POA: Diagnosis not present

## 2024-03-07 DIAGNOSIS — B351 Tinea unguium: Secondary | ICD-10-CM | POA: Diagnosis not present

## 2024-03-07 DIAGNOSIS — M79675 Pain in left toe(s): Secondary | ICD-10-CM | POA: Diagnosis not present

## 2024-03-07 DIAGNOSIS — M79674 Pain in right toe(s): Secondary | ICD-10-CM | POA: Diagnosis not present

## 2024-03-07 DIAGNOSIS — I739 Peripheral vascular disease, unspecified: Secondary | ICD-10-CM | POA: Diagnosis not present

## 2024-03-09 DIAGNOSIS — M1990 Unspecified osteoarthritis, unspecified site: Secondary | ICD-10-CM | POA: Diagnosis not present

## 2024-03-09 DIAGNOSIS — E119 Type 2 diabetes mellitus without complications: Secondary | ICD-10-CM | POA: Diagnosis not present

## 2024-03-09 DIAGNOSIS — N189 Chronic kidney disease, unspecified: Secondary | ICD-10-CM | POA: Diagnosis not present

## 2024-03-09 DIAGNOSIS — I10 Essential (primary) hypertension: Secondary | ICD-10-CM | POA: Diagnosis not present

## 2024-03-10 DIAGNOSIS — E559 Vitamin D deficiency, unspecified: Secondary | ICD-10-CM | POA: Diagnosis not present

## 2024-03-10 DIAGNOSIS — I739 Peripheral vascular disease, unspecified: Secondary | ICD-10-CM | POA: Diagnosis not present

## 2024-03-10 DIAGNOSIS — M1990 Unspecified osteoarthritis, unspecified site: Secondary | ICD-10-CM | POA: Diagnosis not present

## 2024-03-10 DIAGNOSIS — I10 Essential (primary) hypertension: Secondary | ICD-10-CM | POA: Diagnosis not present

## 2024-03-30 DIAGNOSIS — I10 Essential (primary) hypertension: Secondary | ICD-10-CM | POA: Diagnosis not present

## 2024-04-28 DIAGNOSIS — N189 Chronic kidney disease, unspecified: Secondary | ICD-10-CM | POA: Diagnosis not present

## 2024-04-28 DIAGNOSIS — E119 Type 2 diabetes mellitus without complications: Secondary | ICD-10-CM | POA: Diagnosis not present

## 2024-04-28 DIAGNOSIS — I10 Essential (primary) hypertension: Secondary | ICD-10-CM | POA: Diagnosis not present

## 2024-04-28 DIAGNOSIS — M1990 Unspecified osteoarthritis, unspecified site: Secondary | ICD-10-CM | POA: Diagnosis not present

## 2024-04-29 DIAGNOSIS — I10 Essential (primary) hypertension: Secondary | ICD-10-CM | POA: Diagnosis not present

## 2024-05-01 DIAGNOSIS — R944 Abnormal results of kidney function studies: Secondary | ICD-10-CM | POA: Diagnosis not present

## 2024-05-02 DIAGNOSIS — H524 Presbyopia: Secondary | ICD-10-CM | POA: Diagnosis not present

## 2024-05-09 DIAGNOSIS — M79674 Pain in right toe(s): Secondary | ICD-10-CM | POA: Diagnosis not present

## 2024-05-09 DIAGNOSIS — B351 Tinea unguium: Secondary | ICD-10-CM | POA: Diagnosis not present

## 2024-05-09 DIAGNOSIS — I739 Peripheral vascular disease, unspecified: Secondary | ICD-10-CM | POA: Diagnosis not present

## 2024-05-09 DIAGNOSIS — M79675 Pain in left toe(s): Secondary | ICD-10-CM | POA: Diagnosis not present

## 2024-05-10 DIAGNOSIS — R7309 Other abnormal glucose: Secondary | ICD-10-CM | POA: Diagnosis not present

## 2024-05-10 DIAGNOSIS — Z79899 Other long term (current) drug therapy: Secondary | ICD-10-CM | POA: Diagnosis not present

## 2024-05-10 DIAGNOSIS — E038 Other specified hypothyroidism: Secondary | ICD-10-CM | POA: Diagnosis not present

## 2024-05-10 DIAGNOSIS — D519 Vitamin B12 deficiency anemia, unspecified: Secondary | ICD-10-CM | POA: Diagnosis not present

## 2024-05-10 DIAGNOSIS — E782 Mixed hyperlipidemia: Secondary | ICD-10-CM | POA: Diagnosis not present

## 2024-05-11 DIAGNOSIS — I739 Peripheral vascular disease, unspecified: Secondary | ICD-10-CM | POA: Diagnosis not present

## 2024-05-11 DIAGNOSIS — E119 Type 2 diabetes mellitus without complications: Secondary | ICD-10-CM | POA: Diagnosis not present

## 2024-05-11 DIAGNOSIS — K219 Gastro-esophageal reflux disease without esophagitis: Secondary | ICD-10-CM | POA: Diagnosis not present

## 2024-05-11 DIAGNOSIS — N189 Chronic kidney disease, unspecified: Secondary | ICD-10-CM | POA: Diagnosis not present

## 2024-05-11 DIAGNOSIS — E559 Vitamin D deficiency, unspecified: Secondary | ICD-10-CM | POA: Diagnosis not present

## 2024-05-11 DIAGNOSIS — M1990 Unspecified osteoarthritis, unspecified site: Secondary | ICD-10-CM | POA: Diagnosis not present

## 2024-05-11 DIAGNOSIS — I10 Essential (primary) hypertension: Secondary | ICD-10-CM | POA: Diagnosis not present

## 2024-05-30 DIAGNOSIS — I739 Peripheral vascular disease, unspecified: Secondary | ICD-10-CM | POA: Diagnosis not present

## 2024-05-30 DIAGNOSIS — I10 Essential (primary) hypertension: Secondary | ICD-10-CM | POA: Diagnosis not present

## 2024-05-30 DIAGNOSIS — E559 Vitamin D deficiency, unspecified: Secondary | ICD-10-CM | POA: Diagnosis not present

## 2024-05-30 DIAGNOSIS — E038 Other specified hypothyroidism: Secondary | ICD-10-CM | POA: Diagnosis not present

## 2024-05-31 DIAGNOSIS — G4700 Insomnia, unspecified: Secondary | ICD-10-CM | POA: Diagnosis not present

## 2024-05-31 DIAGNOSIS — G8929 Other chronic pain: Secondary | ICD-10-CM | POA: Diagnosis not present

## 2024-06-01 DIAGNOSIS — I10 Essential (primary) hypertension: Secondary | ICD-10-CM | POA: Diagnosis not present

## 2024-06-02 DIAGNOSIS — G8929 Other chronic pain: Secondary | ICD-10-CM | POA: Diagnosis not present

## 2024-06-16 DIAGNOSIS — I10 Essential (primary) hypertension: Secondary | ICD-10-CM | POA: Diagnosis not present

## 2024-06-16 DIAGNOSIS — M1990 Unspecified osteoarthritis, unspecified site: Secondary | ICD-10-CM | POA: Diagnosis not present

## 2024-06-16 DIAGNOSIS — E038 Other specified hypothyroidism: Secondary | ICD-10-CM | POA: Diagnosis not present

## 2024-06-16 DIAGNOSIS — I739 Peripheral vascular disease, unspecified: Secondary | ICD-10-CM | POA: Diagnosis not present

## 2024-06-23 DIAGNOSIS — I739 Peripheral vascular disease, unspecified: Secondary | ICD-10-CM | POA: Diagnosis not present

## 2024-06-23 DIAGNOSIS — I10 Essential (primary) hypertension: Secondary | ICD-10-CM | POA: Diagnosis not present

## 2024-06-23 DIAGNOSIS — E038 Other specified hypothyroidism: Secondary | ICD-10-CM | POA: Diagnosis not present

## 2024-06-23 DIAGNOSIS — M1990 Unspecified osteoarthritis, unspecified site: Secondary | ICD-10-CM | POA: Diagnosis not present

## 2024-06-29 DIAGNOSIS — E119 Type 2 diabetes mellitus without complications: Secondary | ICD-10-CM | POA: Diagnosis not present

## 2024-06-29 DIAGNOSIS — M1990 Unspecified osteoarthritis, unspecified site: Secondary | ICD-10-CM | POA: Diagnosis not present

## 2024-06-29 DIAGNOSIS — I10 Essential (primary) hypertension: Secondary | ICD-10-CM | POA: Diagnosis not present

## 2024-06-29 DIAGNOSIS — N189 Chronic kidney disease, unspecified: Secondary | ICD-10-CM | POA: Diagnosis not present

## 2024-07-18 DIAGNOSIS — E119 Type 2 diabetes mellitus without complications: Secondary | ICD-10-CM | POA: Diagnosis not present

## 2024-07-18 DIAGNOSIS — N189 Chronic kidney disease, unspecified: Secondary | ICD-10-CM | POA: Diagnosis not present

## 2024-07-18 DIAGNOSIS — I10 Essential (primary) hypertension: Secondary | ICD-10-CM | POA: Diagnosis not present

## 2024-07-18 DIAGNOSIS — M1990 Unspecified osteoarthritis, unspecified site: Secondary | ICD-10-CM | POA: Diagnosis not present

## 2024-07-20 DIAGNOSIS — E559 Vitamin D deficiency, unspecified: Secondary | ICD-10-CM | POA: Diagnosis not present

## 2024-07-20 DIAGNOSIS — I10 Essential (primary) hypertension: Secondary | ICD-10-CM | POA: Diagnosis not present

## 2024-07-20 DIAGNOSIS — I739 Peripheral vascular disease, unspecified: Secondary | ICD-10-CM | POA: Diagnosis not present

## 2024-07-20 DIAGNOSIS — E038 Other specified hypothyroidism: Secondary | ICD-10-CM | POA: Diagnosis not present

## 2024-08-01 DIAGNOSIS — I10 Essential (primary) hypertension: Secondary | ICD-10-CM | POA: Diagnosis not present

## 2024-08-01 DIAGNOSIS — M1990 Unspecified osteoarthritis, unspecified site: Secondary | ICD-10-CM | POA: Diagnosis not present

## 2024-08-01 DIAGNOSIS — E038 Other specified hypothyroidism: Secondary | ICD-10-CM | POA: Diagnosis not present

## 2024-08-01 DIAGNOSIS — E559 Vitamin D deficiency, unspecified: Secondary | ICD-10-CM | POA: Diagnosis not present

## 2024-08-02 DIAGNOSIS — I10 Essential (primary) hypertension: Secondary | ICD-10-CM | POA: Diagnosis not present

## 2024-08-09 DIAGNOSIS — M79675 Pain in left toe(s): Secondary | ICD-10-CM | POA: Diagnosis not present

## 2024-08-09 DIAGNOSIS — M79674 Pain in right toe(s): Secondary | ICD-10-CM | POA: Diagnosis not present

## 2024-08-09 DIAGNOSIS — B351 Tinea unguium: Secondary | ICD-10-CM | POA: Diagnosis not present

## 2024-08-09 DIAGNOSIS — I739 Peripheral vascular disease, unspecified: Secondary | ICD-10-CM | POA: Diagnosis not present

## 2024-08-16 DIAGNOSIS — R7309 Other abnormal glucose: Secondary | ICD-10-CM | POA: Diagnosis not present

## 2024-08-16 DIAGNOSIS — E038 Other specified hypothyroidism: Secondary | ICD-10-CM | POA: Diagnosis not present

## 2024-08-16 DIAGNOSIS — Z79899 Other long term (current) drug therapy: Secondary | ICD-10-CM | POA: Diagnosis not present

## 2024-08-16 DIAGNOSIS — D519 Vitamin B12 deficiency anemia, unspecified: Secondary | ICD-10-CM | POA: Diagnosis not present

## 2024-08-16 DIAGNOSIS — E782 Mixed hyperlipidemia: Secondary | ICD-10-CM | POA: Diagnosis not present

## 2024-08-18 DIAGNOSIS — N189 Chronic kidney disease, unspecified: Secondary | ICD-10-CM | POA: Diagnosis not present

## 2024-08-18 DIAGNOSIS — E038 Other specified hypothyroidism: Secondary | ICD-10-CM | POA: Diagnosis not present

## 2024-08-18 DIAGNOSIS — I10 Essential (primary) hypertension: Secondary | ICD-10-CM | POA: Diagnosis not present

## 2024-08-18 DIAGNOSIS — E119 Type 2 diabetes mellitus without complications: Secondary | ICD-10-CM | POA: Diagnosis not present

## 2024-08-18 DIAGNOSIS — M1990 Unspecified osteoarthritis, unspecified site: Secondary | ICD-10-CM | POA: Diagnosis not present

## 2024-08-18 DIAGNOSIS — I739 Peripheral vascular disease, unspecified: Secondary | ICD-10-CM | POA: Diagnosis not present

## 2024-09-02 DIAGNOSIS — I10 Essential (primary) hypertension: Secondary | ICD-10-CM | POA: Diagnosis not present
